# Patient Record
Sex: Male | Born: 1990 | Race: White | Hispanic: No | Marital: Single | Smoking: Former smoker
Health system: Southern US, Community
[De-identification: ages and names within clinical notes are randomized; demographics above are authoritative.]

## PROBLEM LIST (undated history)

## (undated) ENCOUNTER — Ambulatory Visit (HOSPITAL_COMMUNITY): Admission: EM | Payer: Medicaid Other | Source: Home / Self Care

## (undated) DIAGNOSIS — S6291XA Unspecified fracture of right wrist and hand, initial encounter for closed fracture: Secondary | ICD-10-CM

## (undated) DIAGNOSIS — F319 Bipolar disorder, unspecified: Secondary | ICD-10-CM

## (undated) DIAGNOSIS — F209 Schizophrenia, unspecified: Secondary | ICD-10-CM

## (undated) DIAGNOSIS — F329 Major depressive disorder, single episode, unspecified: Secondary | ICD-10-CM

## (undated) DIAGNOSIS — F1990 Other psychoactive substance use, unspecified, uncomplicated: Secondary | ICD-10-CM

## (undated) DIAGNOSIS — F32A Depression, unspecified: Secondary | ICD-10-CM

## (undated) DIAGNOSIS — S6292XA Unspecified fracture of left wrist and hand, initial encounter for closed fracture: Secondary | ICD-10-CM

## (undated) HISTORY — PX: CYST EXCISION: SHX5701

---
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
## 2012-06-24 ENCOUNTER — Emergency Department (HOSPITAL_COMMUNITY): Payer: Self-pay

## 2012-06-24 ENCOUNTER — Encounter (HOSPITAL_COMMUNITY): Payer: Self-pay | Admitting: *Deleted

## 2012-06-24 ENCOUNTER — Emergency Department (HOSPITAL_COMMUNITY)
Admission: EM | Admit: 2012-06-24 | Discharge: 2012-06-24 | Disposition: A | Discharge status: Home / Self Care | Payer: Self-pay | Attending: Emergency Medicine | Admitting: Emergency Medicine

## 2012-06-24 DIAGNOSIS — W503XXA Accidental bite by another person, initial encounter: Secondary | ICD-10-CM

## 2012-06-24 DIAGNOSIS — S0010XA Contusion of unspecified eyelid and periocular area, initial encounter: Secondary | ICD-10-CM | POA: Insufficient documentation

## 2012-06-24 DIAGNOSIS — S61209A Unspecified open wound of unspecified finger without damage to nail, initial encounter: Secondary | ICD-10-CM | POA: Insufficient documentation

## 2012-06-24 DIAGNOSIS — H1130 Conjunctival hemorrhage, unspecified eye: Secondary | ICD-10-CM | POA: Insufficient documentation

## 2012-06-24 DIAGNOSIS — Z8781 Personal history of (healed) traumatic fracture: Secondary | ICD-10-CM | POA: Insufficient documentation

## 2012-06-24 DIAGNOSIS — H9319 Tinnitus, unspecified ear: Secondary | ICD-10-CM | POA: Insufficient documentation

## 2012-06-24 DIAGNOSIS — S0510XA Contusion of eyeball and orbital tissues, unspecified eye, initial encounter: Secondary | ICD-10-CM | POA: Insufficient documentation

## 2012-06-24 DIAGNOSIS — R42 Dizziness and giddiness: Secondary | ICD-10-CM | POA: Insufficient documentation

## 2012-06-24 DIAGNOSIS — H5711 Ocular pain, right eye: Secondary | ICD-10-CM

## 2012-06-24 DIAGNOSIS — IMO0002 Reserved for concepts with insufficient information to code with codable children: Secondary | ICD-10-CM | POA: Insufficient documentation

## 2012-06-24 HISTORY — DX: Unspecified fracture of right wrist and hand, initial encounter for closed fracture: S62.91XA

## 2012-06-24 HISTORY — DX: Unspecified fracture of left wrist and hand, initial encounter for closed fracture: S62.92XA

## 2012-06-24 MED ORDER — OXYCODONE-ACETAMINOPHEN 5-325 MG PO TABS
1.0000 | ORAL_TABLET | Freq: Once | ORAL | Status: AC
  Administered 2012-06-24: 1 via ORAL
  Filled 2012-06-24: qty 1

## 2012-06-24 MED ORDER — AMOXICILLIN-POT CLAVULANATE 875-125 MG PO TABS: 1.0000 | ORAL_TABLET | Freq: Two times a day (BID) | ORAL | Status: DC

## 2012-06-24 MED ORDER — FLUORESCEIN SODIUM 1 MG OP STRP
1.0000 | ORAL_STRIP | Freq: Once | OPHTHALMIC | Status: AC
  Administered 2012-06-24: 11:00:00 via OPHTHALMIC
  Filled 2012-06-24: qty 1

## 2012-06-24 MED ORDER — TETRACAINE HCL 0.5 % OP SOLN
1.0000 [drp] | Freq: Once | OPHTHALMIC | Status: AC
  Administered 2012-06-24: 1 [drp] via OPHTHALMIC
  Filled 2012-06-24: qty 2

## 2012-06-24 MED ORDER — OXYCODONE-ACETAMINOPHEN 5-325 MG PO TABS
1.0000 | ORAL_TABLET | ORAL | Status: DC | PRN
Start: 2012-06-24 — End: 2013-02-06
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
## 2012-06-26 NOTE — ED Provider Notes (Signed)
Medical screening examination/treatment/procedure(s) were performed by non-physician practitioner and as supervising physician I was immediately available for consultation/collaboration.   Gavin Pound. Yue Glasheen, MD 06/26/12 1242

## 2012-07-25 ENCOUNTER — Emergency Department (HOSPITAL_COMMUNITY)
Admission: EM | Admit: 2012-07-25 | Discharge: 2012-07-25 | Disposition: A | Discharge status: Home / Self Care | Payer: Self-pay

## 2012-07-25 NOTE — ED Notes (Signed)
Pt not answering for triage  

## 2012-11-17 ENCOUNTER — Encounter (HOSPITAL_COMMUNITY): Payer: Self-pay | Admitting: Emergency Medicine

## 2012-11-17 ENCOUNTER — Emergency Department (HOSPITAL_COMMUNITY)
Admission: EM | Admit: 2012-11-17 | Discharge: 2012-11-17 | Disposition: A | Discharge status: Home / Self Care | Payer: Self-pay | Attending: Emergency Medicine | Admitting: Emergency Medicine

## 2012-11-17 DIAGNOSIS — L237 Allergic contact dermatitis due to plants, except food: Secondary | ICD-10-CM

## 2012-11-17 DIAGNOSIS — L255 Unspecified contact dermatitis due to plants, except food: Secondary | ICD-10-CM | POA: Insufficient documentation

## 2012-11-17 MED ORDER — PREDNISONE 20 MG PO TABS: ORAL_TABLET | ORAL | Status: DC

## 2012-11-17 MED ORDER — PREDNISONE 20 MG PO TABS
60.0000 mg | ORAL_TABLET | Freq: Once | ORAL | Status: AC
  Administered 2012-11-17: 60 mg via ORAL
  Filled 2012-11-17: qty 3
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
## 2012-11-17 NOTE — ED Provider Notes (Signed)
CSN: [id number]     Arrival date & time 11/17/12  0622 History   First MD Initiated Contact with Patient 11/17/12 [phone]     Chief Complaint  Patient presents with  . Rash  . Pruritis   (Consider location/radiation/quality/duration/timing/severity/associated sxs/prior Treatment) HPI  Miguel [name] is a 22 y.o. male complaining of stinging rash to bilateral lower extreimties x2 weeks after Pt walked through briars. Denies fever, chills, N/V   Past Medical History  Diagnosis Date  . Hand fracture, right 2008    no surgery required  . Hand fracture, left 2010   Past Surgical History  Procedure Laterality Date  . Left hand fracture Left 2010    Dr Magnus Ivan  . Cyst excision     No family history on file. History  Substance Use Topics  . Smoking status: Never Smoker   . Smokeless tobacco: Never Used  . Alcohol Use: No    Review of Systems 10 systems reviewed and found to be negative, except as noted in the HPI   Allergies  Review of patient's allergies indicates no known allergies.  Home Medications   Current Outpatient Rx  Name  Route  Sig  Dispense  Refill  . oxyCODONE-acetaminophen (PERCOCET/ROXICET) 5-325 MG per tablet   Oral   Take 1 tablet by mouth every 4 (four) hours as needed for pain.   15 tablet   0    BP 138/90  Pulse 78  Temp(Src) 97.6 F (36.4 C) (Oral)  Resp 18  SpO2 100% Physical Exam  Nursing note and vitals reviewed. Constitutional: He is oriented to person, place, and time. He appears well-developed and well-nourished. No distress.  HENT:  Head: Normocephalic.  Mouth/Throat: Oropharynx is clear and moist.  Eyes: Conjunctivae and EOM are normal. Pupils are equal, round, and reactive to light.  Cardiovascular: Normal rate, regular rhythm and intact distal pulses.   Pulmonary/Chest: Effort normal and breath sounds normal. No stridor. No respiratory distress. He has no wheezes. He has no rales. He exhibits no tenderness.  Abdominal: Soft.  Bowel sounds are normal. He exhibits no distension and no mass. There is no tenderness. There is no rebound and no guarding.  Musculoskeletal: Normal range of motion.  Neurological: He is alert and oriented to person, place, and time.  Skin:  Erythematous, mildly excoriated papules to bilateral lower extremities, left flank, bilateral upper extremities. No warmth, induration or purulent drainage.   Spares palms, soles and mucous membranes  Psychiatric: He has a normal mood and affect.    ED Course  Procedures (including critical care time) Labs Review Labs Reviewed - No data to display Imaging Review No results found.  EKG Interpretation   None       MDM   1. Contact dermatitis due to poison ivy    Filed Vitals:   11/17/12 0628  BP: 138/90  Pulse: 78  Temp: 97.6 F (36.4 C)  TempSrc: Oral  Resp: 18  SpO2: 100%     Miguel [name] is a 22 y.o. male with rash c/w poison ivy. No signs of secondary infection. Rash with no red flags or systemic complaints  Medications  predniSONE (DELTASONE) tablet 60 mg (not administered)    Pt is hemodynamically stable, appropriate for, and amenable to discharge at this time. Pt verbalized understanding and agrees with care plan. All questions answered. Outpatient follow-up and specific return precautions discussed.    New Prescriptions   PREDNISONE (DELTASONE) 20 MG TABLET    3 tabs  po daily x 3 days, then 2 tabs x 3 days, then 1.5 tabs x 3 days, then 1 tab x 3 days, then 0.5 tabs x 3 days    Note: Portions of this report may have been transcribed using voice recognition software. Every effort was made to ensure accuracy; however, inadvertent computerized transcription errors may be present      Wynetta Emery, PA-C 11/17/12 1610

## 2012-11-19 NOTE — ED Provider Notes (Signed)
Medical screening examination/treatment/procedure(s) were performed by non-physician practitioner and as supervising physician I was immediately available for consultation/collaboration.   Makaleigh Reinard, MD 11/19/12 0755 

## 2012-12-27 ENCOUNTER — Encounter (HOSPITAL_COMMUNITY): Payer: Self-pay | Admitting: Emergency Medicine

## 2012-12-27 ENCOUNTER — Emergency Department (HOSPITAL_COMMUNITY)
Admission: EM | Admit: 2012-12-27 | Discharge: 2012-12-27 | Disposition: A | Discharge status: Home / Self Care | Payer: Self-pay | Attending: Emergency Medicine | Admitting: Emergency Medicine

## 2012-12-27 DIAGNOSIS — B86 Scabies: Secondary | ICD-10-CM | POA: Insufficient documentation

## 2012-12-27 DIAGNOSIS — Z8781 Personal history of (healed) traumatic fracture: Secondary | ICD-10-CM | POA: Insufficient documentation

## 2012-12-27 MED ORDER — PERMETHRIN 5 % EX CREA: TOPICAL_CREAM | CUTANEOUS | Status: DC

## 2012-12-27 NOTE — ED Provider Notes (Signed)
CSN: [id number]     Arrival date & time 12/27/12  0116 History   First MD Initiated Contact with Patient 12/27/12 0200     Chief Complaint  Patient presents with  . Poison Oak   HPI  History provided by the patient. The patient is a 22 year old male presents with concerns for possible poison oak rash. 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 He states that he has had recurring rash and itching primarily to his hands and arms for the past several weeks. He states that he was using calamine lotion with concerns for possible exposure to poison oak and this seemed to help some however he is not having any relief recently. He also reports that his significant other is also having symptoms of similar rash on the body. Significant other does not have any direct exposure to poison oak. He denies any associated fever, chills or sweats. No aggravating or alleviating factors. No other associated symptoms.    Past Medical History  Diagnosis Date  . Hand fracture, right 2008    no surgery required  . Hand fracture, left 2010   Past Surgical History  Procedure Laterality Date  . Left hand fracture Left 2010    Dr Magnus Ivan  . Cyst excision     History reviewed. No pertinent family history. History  Substance Use Topics  . Smoking status: Never Smoker   . Smokeless tobacco: Never Used  . Alcohol Use: No    Review of Systems  Constitutional: Negative for fever, chills and diaphoresis.  Respiratory: Negative for shortness of breath and wheezing.   Skin: Positive for rash.  All other systems reviewed and are negative.    Allergies  Review of patient's allergies indicates no known allergies.  Home Medications   Current Outpatient Rx  Name  Route  Sig  Dispense  Refill  . oxyCODONE-acetaminophen (PERCOCET/ROXICET) 5-325 MG per tablet   Oral   Take 1 tablet by mouth every 4 (four) hours as needed for pain.   15 tablet   0    BP 125/89  Pulse 78   Temp(Src) 97.9 F (36.6 C) (Oral)  Resp 18  SpO2 99% Physical Exam  Nursing note and vitals reviewed. Constitutional: He is oriented to person, place, and time. He appears well-developed and well-nourished. No distress.  HENT:  Head: Normocephalic.  Cardiovascular: Normal rate and regular rhythm.   Pulmonary/Chest: Effort normal and breath sounds normal. No respiratory distress. He has no wheezes. He has no rales.  Abdominal: Soft.  Musculoskeletal: Normal range of motion.  Neurological: He is alert and oriented to person, place, and time.  Skin:  Papular rash to the web spacing of bilateral dorsal hand and fingers. They're similar small papular lesions around the wrist area. No larger areas of erythema or vesicles.  Psychiatric: He has a normal mood and affect. His behavior is normal.    ED Course  Procedures    Patient seen and evaluated. Patient and significant other both with similar rash persistent for the past one month. Rash is not appear consistent with contact dermatitis from poison oak. I doubt that patient is having repeat exposure to the same areas. Rash and symptoms are more concerning for possible scabies. Discussed diagnosis and treatment with patient who agrees with plan.   MDM   1. Scabies        Angus Seller, PA-C 12/27/12 [phone]

## 2012-12-27 NOTE — ED Notes (Signed)
Pt c/o rash, itching, and burning over skin, states he was exposed to poison oak. Pt states was taking steroid.

## 2012-12-27 NOTE — ED Provider Notes (Signed)
Medical screening examination/treatment/procedure(s) were performed by non-physician practitioner and as supervising physician I was immediately available for consultation/collaboration.  EKG Interpretation   None         Junius Argyle, MD 12/27/12 [phone]

## 2013-02-02 ENCOUNTER — Encounter (HOSPITAL_COMMUNITY): Payer: Self-pay | Admitting: Emergency Medicine

## 2013-02-02 ENCOUNTER — Emergency Department (HOSPITAL_COMMUNITY)
Admission: EM | Admit: 2013-02-02 | Discharge: 2013-02-02 | Disposition: A | Discharge status: Home / Self Care | Payer: Self-pay | Attending: Emergency Medicine | Admitting: Emergency Medicine

## 2013-02-02 ENCOUNTER — Emergency Department (HOSPITAL_COMMUNITY): Payer: Self-pay

## 2013-02-02 DIAGNOSIS — M545 Low back pain, unspecified: Secondary | ICD-10-CM

## 2013-02-02 DIAGNOSIS — Y92410 Unspecified street and highway as the place of occurrence of the external cause: Secondary | ICD-10-CM | POA: Insufficient documentation

## 2013-02-02 DIAGNOSIS — Z8781 Personal history of (healed) traumatic fracture: Secondary | ICD-10-CM | POA: Insufficient documentation

## 2013-02-02 DIAGNOSIS — Y9389 Activity, other specified: Secondary | ICD-10-CM | POA: Insufficient documentation

## 2013-02-02 DIAGNOSIS — IMO0002 Reserved for concepts with insufficient information to code with codable children: Secondary | ICD-10-CM | POA: Insufficient documentation

## 2013-02-02 MED ORDER — ACETAMINOPHEN 325 MG PO TABS
650.0000 mg | ORAL_TABLET | Freq: Once | ORAL | Status: AC
  Administered 2013-02-02: 650 mg via ORAL
  Filled 2013-02-02: qty 2

## 2013-02-02 MED ORDER — NAPROXEN 500 MG PO TABS: 500.0000 mg | ORAL_TABLET | Freq: Two times a day (BID) | ORAL | Status: DC

## 2013-02-02 MED ORDER — CYCLOBENZAPRINE HCL 5 MG PO TABS: 5.0000 mg | ORAL_TABLET | Freq: Three times a day (TID) | ORAL | Status: DC | PRN
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
## 2013-02-02 NOTE — ED Notes (Signed)
C/o lower back pain. Ambulates without difficulty.
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
## 2013-02-04 ENCOUNTER — Encounter (HOSPITAL_COMMUNITY): Payer: Self-pay | Admitting: Emergency Medicine

## 2013-02-04 ENCOUNTER — Emergency Department (HOSPITAL_COMMUNITY)
Admission: EM | Admit: 2013-02-04 | Discharge: 2013-02-04 | Disposition: A | Discharge status: Home / Self Care | Payer: Self-pay | Attending: Emergency Medicine | Admitting: Emergency Medicine

## 2013-02-04 DIAGNOSIS — M545 Low back pain, unspecified: Secondary | ICD-10-CM | POA: Insufficient documentation

## 2013-02-04 DIAGNOSIS — G8911 Acute pain due to trauma: Secondary | ICD-10-CM | POA: Insufficient documentation

## 2013-02-04 DIAGNOSIS — Z791 Long term (current) use of non-steroidal anti-inflammatories (NSAID): Secondary | ICD-10-CM | POA: Insufficient documentation

## 2013-02-04 DIAGNOSIS — Z8781 Personal history of (healed) traumatic fracture: Secondary | ICD-10-CM | POA: Insufficient documentation

## 2013-02-04 DIAGNOSIS — S39012D Strain of muscle, fascia and tendon of lower back, subsequent encounter: Secondary | ICD-10-CM

## 2013-02-04 LAB — URINALYSIS, ROUTINE W REFLEX MICROSCOPIC
Bilirubin Urine: NEGATIVE
GLUCOSE, UA: NEGATIVE mg/dL
HGB URINE DIPSTICK: NEGATIVE
Ketones, ur: NEGATIVE mg/dL
Nitrite: NEGATIVE
PH: 6 (ref 5.0–8.0)
PROTEIN: NEGATIVE mg/dL
Specific Gravity, Urine: 1.02 (ref 1.005–1.030)
Urobilinogen, UA: 0.2 mg/dL (ref 0.0–1.0)

## 2013-02-04 LAB — URINE MICROSCOPIC-ADD ON

## 2013-02-04 MED ORDER — DIAZEPAM 2 MG PO TABS
2.0000 mg | ORAL_TABLET | Freq: Once | ORAL | Status: AC
  Administered 2013-02-04: 2 mg via ORAL

## 2013-02-04 MED ORDER — OXYCODONE-ACETAMINOPHEN 5-325 MG PO TABS
ORAL_TABLET | ORAL | Status: AC
  Filled 2013-02-04: qty 2

## 2013-02-04 MED ORDER — OXYCODONE-ACETAMINOPHEN 5-325 MG PO TABS
2.0000 | ORAL_TABLET | Freq: Once | ORAL | Status: AC
  Administered 2013-02-04: 2 via ORAL

## 2013-02-04 MED ORDER — OXYCODONE-ACETAMINOPHEN 5-325 MG PO TABS: 2.0000 | ORAL_TABLET | ORAL | Status: DC | PRN

## 2013-02-04 MED ORDER — HYDROCODONE-ACETAMINOPHEN 5-325 MG PO TABS: 2.0000 | ORAL_TABLET | Freq: Once | ORAL | Status: DC

## 2013-02-04 MED ORDER — HYDROCODONE-ACETAMINOPHEN 5-325 MG PO TABS
ORAL_TABLET | ORAL | Status: AC
  Filled 2013-02-04: qty 2

## 2013-02-04 MED ORDER — DIAZEPAM 2 MG PO TABS
ORAL_TABLET | ORAL | Status: AC
  Filled 2013-02-04: qty 1

## 2013-02-04 MED ORDER — IBUPROFEN 800 MG PO TABS: 800.0000 mg | ORAL_TABLET | Freq: Three times a day (TID) | ORAL | Status: DC
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
## 2013-02-04 NOTE — ED Notes (Signed)
Pt ambulated around POD A with slow, steady gait. Witnessed by Dr. Manus Gunningancour.
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
## 2013-02-04 NOTE — ED Notes (Signed)
Dr. Rancour at bedside. 

## 2013-02-04 NOTE — ED Notes (Signed)
Pt A&Ox4, ambulatory upon discharge.

## 2013-02-04 NOTE — Discharge Instructions (Signed)
Lumbosacral Strain Take ibuprofen instead of naproxen. Followup with the wellness center. Return to the ED if you develop new or worsening symptoms. Lumbosacral strain is one of the most common causes of back pain. There are many causes of back pain. Most are not serious conditions. CAUSES  Your backbone (spinal column) is made up of 24 main vertebral bodies, the sacrum, and the coccyx. These are held together by muscles and tough, fibrous tissue (ligaments). Nerve roots pass through the openings between the vertebrae. A sudden move or injury to the back may cause injury to, or pressure on, these nerves. This may result in localized back pain or pain movement (radiation) into the buttocks, down the leg, and into the foot. Sharp, shooting pain from the buttock down the back of the leg (sciatica) is frequently associated with a ruptured (herniated) disk. Pain may be caused by muscle spasm alone. Your caregiver can often find the cause of your pain by the details of your symptoms and an exam. In some cases, you may need tests (such as X-rays). Your caregiver will work with you to decide if any tests are needed based on your specific exam. HOME CARE INSTRUCTIONS   Avoid an underactive lifestyle. Active exercise, as directed by your caregiver, is your greatest weapon against back pain.  Avoid hard physical activities (tennis, racquetball, waterskiing) if you are not in proper physical condition for it. This may aggravate or create problems.  If you have a back problem, avoid sports requiring sudden body movements. Swimming and walking are generally safer activities.  Maintain good posture.  Avoid becoming overweight (obese).  Use bed rest for only the most extreme, sudden (acute) episode. Your caregiver will help you determine how much bed rest is necessary.  For acute conditions, you may put ice on the injured area.  Put ice in a plastic bag.  Place a towel between your skin and the bag.  Leave  the ice on for 15-20 minutes at a time, every 2 hours, or as needed.  After you are improved and more active, it may help to apply heat for 30 minutes before activities. See your caregiver if you are having pain that lasts longer than expected. Your caregiver can advise appropriate exercises or therapy if needed. With conditioning, most back problems can be avoided. SEEK IMMEDIATE MEDICAL CARE IF:   You have numbness, tingling, weakness, or problems with the use of your arms or legs.  You experience severe back pain not relieved with medicines.  There is a change in bowel or bladder control.  You have increasing pain in any area of the body, including your belly (abdomen).  You notice shortness of breath, dizziness, or feel faint.  You feel sick to your stomach (nauseous), are throwing up (vomiting), or become sweaty.  You notice discoloration of your toes or legs, or your feet get very cold.  Your back pain is getting worse.  You have a fever. MAKE SURE YOU:   Understand these instructions.  Will watch your condition.  Will get help right away if you are not doing well or get worse. Document Released: 10/25/2004 Document Revised: 04/09/2011 Document Reviewed: 04/16/2008 [facility] Patient Information 2014 [place], [place].

## 2013-02-06 ENCOUNTER — Encounter (HOSPITAL_COMMUNITY): Payer: Self-pay | Admitting: Emergency Medicine

## 2013-02-06 ENCOUNTER — Emergency Department (HOSPITAL_COMMUNITY)
Admission: EM | Admit: 2013-02-06 | Discharge: 2013-02-06 | Disposition: A | Discharge status: Home / Self Care | Payer: Self-pay | Attending: Emergency Medicine | Admitting: Emergency Medicine

## 2013-02-06 DIAGNOSIS — M549 Dorsalgia, unspecified: Secondary | ICD-10-CM

## 2013-02-06 DIAGNOSIS — G8911 Acute pain due to trauma: Secondary | ICD-10-CM | POA: Insufficient documentation

## 2013-02-06 DIAGNOSIS — M545 Low back pain, unspecified: Secondary | ICD-10-CM | POA: Insufficient documentation

## 2013-02-06 DIAGNOSIS — Z8781 Personal history of (healed) traumatic fracture: Secondary | ICD-10-CM | POA: Insufficient documentation

## 2013-02-06 MED ORDER — OXYCODONE-ACETAMINOPHEN 5-325 MG PO TABS
2.0000 | ORAL_TABLET | Freq: Once | ORAL | Status: AC
  Administered 2013-02-06: 2 via ORAL
  Filled 2013-02-06: qty 2

## 2013-02-06 MED ORDER — OXYCODONE-ACETAMINOPHEN 5-325 MG PO TABS: 1.0000 | ORAL_TABLET | Freq: Four times a day (QID) | ORAL | Status: DC | PRN

## 2013-02-06 NOTE — Discharge Instructions (Signed)
Back Pain, Adult Low back pain is very common. About 1 in 5 people have back pain.The cause of low back pain is rarely dangerous. The pain often gets better over time.About half of people with a sudden onset of back pain feel better in just 2 weeks. About 8 in 10 people feel better by 6 weeks.  CAUSES Some common causes of back pain include:  Strain of the muscles or ligaments supporting the spine.  Wear and tear (degeneration) of the spinal discs.  Arthritis.  Direct injury to the back. DIAGNOSIS Most of the time, the direct cause of low back pain is not known.However, back pain can be treated effectively even when the exact cause of the pain is unknown.Answering your caregiver's questions about your overall health and symptoms is one of the most accurate ways to make sure the cause of your pain is not dangerous. If your caregiver needs more information, he or she may order lab work or imaging tests (X-rays or MRIs).However, even if imaging tests show changes in your back, this usually does not require surgery. HOME CARE INSTRUCTIONS For many people, back pain returns.Since low back pain is rarely dangerous, it is often a condition that people can learn to manageon their own.   Remain active. It is stressful on the back to sit or stand in one place. Do not sit, drive, or stand in one place for more than 30 minutes at a time. Take short walks on level surfaces as soon as pain allows.Try to increase the length of time you walk each day.  Do not stay in bed.Resting more than 1 or 2 days can delay your recovery.  Do not avoid exercise or work.Your body is made to move.It is not dangerous to be active, even though your back may hurt.Your back will likely heal faster if you return to being active before your pain is gone.  Pay attention to your body when you bend and lift. Many people have less discomfortwhen lifting if they bend their knees, keep the load close to their bodies,and  avoid twisting. Often, the most comfortable positions are those that put less stress on your recovering back.  Find a comfortable position to sleep. Use a firm mattress and lie on your side with your knees slightly bent. If you lie on your back, put a pillow under your knees.  Only take over-the-counter or prescription medicines as directed by your caregiver. Over-the-counter medicines to reduce pain and inflammation are often the most helpful.Your caregiver may prescribe muscle relaxant drugs.These medicines help dull your pain so you can more quickly return to your normal activities and healthy exercise.  Put ice on the injured area.  Put ice in a plastic bag.  Place a towel between your skin and the bag.  Leave the ice on for 15-20 minutes, 03-04 times a day for the first 2 to 3 days. After that, ice and heat may be alternated to reduce pain and spasms.  Ask your caregiver about trying back exercises and gentle massage. This may be of some benefit.  Avoid feeling anxious or stressed.Stress increases muscle tension and can worsen back pain.It is important to recognize when you are anxious or stressed and learn ways to manage it.Exercise is a great option. SEEK MEDICAL CARE IF:  You have pain that is not relieved with rest or medicine.  You have pain that does not improve in 1 week.  You have new symptoms.  You are generally not feeling well. SEEK   IMMEDIATE MEDICAL CARE IF:   You have pain that radiates from your back into your legs.  You develop new bowel or bladder control problems.  You have unusual weakness or numbness in your arms or legs.  You develop nausea or vomiting.  You develop abdominal pain.  You feel faint. Document Released: 01/15/2005 Document Revised: 07/17/2011 Document Reviewed: 06/05/2010 [employer] Patient Information 2014 [employer].  
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
## 2013-02-06 NOTE — ED Provider Notes (Signed)
Medical screening examination/treatment/procedure(s) were performed by non-physician practitioner and as supervising physician I was immediately available for consultation/collaboration.  Christopher J. Pollina, MD 02/06/13 0758 
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
## 2013-02-07 NOTE — ED Provider Notes (Signed)
Medical screening examination/treatment/procedure(s) were performed by non-physician practitioner and as supervising physician I was immediately available for consultation/collaboration.  EKG Interpretation   None         Candyce ChurnJohn David Tangie Stay, MD 02/07/13 1350

## 2013-05-03 ENCOUNTER — Emergency Department (HOSPITAL_COMMUNITY)
Admission: EM | Admit: 2013-05-03 | Discharge: 2013-05-03 | Disposition: A | Discharge status: Home / Self Care | Payer: Self-pay | Attending: Emergency Medicine | Admitting: Emergency Medicine

## 2013-05-03 ENCOUNTER — Encounter (HOSPITAL_COMMUNITY): Payer: Self-pay | Admitting: Emergency Medicine

## 2013-05-03 ENCOUNTER — Emergency Department (HOSPITAL_COMMUNITY): Payer: Self-pay

## 2013-05-03 DIAGNOSIS — Z8781 Personal history of (healed) traumatic fracture: Secondary | ICD-10-CM | POA: Insufficient documentation

## 2013-05-03 DIAGNOSIS — Y9289 Other specified places as the place of occurrence of the external cause: Secondary | ICD-10-CM | POA: Insufficient documentation

## 2013-05-03 DIAGNOSIS — Y990 Civilian activity done for income or pay: Secondary | ICD-10-CM | POA: Insufficient documentation

## 2013-05-03 DIAGNOSIS — L089 Local infection of the skin and subcutaneous tissue, unspecified: Secondary | ICD-10-CM

## 2013-05-03 DIAGNOSIS — Y9389 Activity, other specified: Secondary | ICD-10-CM | POA: Insufficient documentation

## 2013-05-03 DIAGNOSIS — W230XXA Caught, crushed, jammed, or pinched between moving objects, initial encounter: Secondary | ICD-10-CM | POA: Insufficient documentation

## 2013-05-03 DIAGNOSIS — S61209A Unspecified open wound of unspecified finger without damage to nail, initial encounter: Secondary | ICD-10-CM | POA: Insufficient documentation

## 2013-05-03 MED ORDER — TRAMADOL HCL 50 MG PO TABS: 50.0000 mg | ORAL_TABLET | Freq: Four times a day (QID) | ORAL | Status: DC | PRN

## 2013-05-03 MED ORDER — CEPHALEXIN 500 MG PO CAPS: 500.0000 mg | ORAL_CAPSULE | Freq: Four times a day (QID) | ORAL | Status: DC

## 2013-05-03 NOTE — ED Provider Notes (Signed)
CSN: [id number]     Arrival date & time 05/03/13  1656 History  This chart was scribed for non-physician practitioner Teressa Lower, NP working with Junius Argyle, MD by Donne Anon, ED Scribe. This patient was seen in room WTR6/WTR6 and the patient's care was started at 1707.    First MD Initiated Contact with Patient 05/03/13 1707     Chief Complaint  Patient presents with  . Finger Injury   The history is provided by the patient. No language interpreter was used.   HPI Comments: Miguel [name] is a 23 y.o. male who presents to the Emergency Department complaining of a right hand middle finger injury which occurred 3 days ago at work. He states while he was changing a tire his finger became caught between the tire and the rim of the car and he pulled it away, giving him a laceration to his finger. He denies any other symptoms. He has tried cleaning the wound and OTC pain medication without relief. His is unsure if his tetanus shot is UTD.   Past Medical History  Diagnosis Date  . Hand fracture, right 2008    no surgery required  . Hand fracture, left 2010   Past Surgical History  Procedure Laterality Date  . Left hand fracture Left 2010    Dr Magnus Ivan  . Cyst excision     No family history on file. History  Substance Use Topics  . Smoking status: Never Smoker   . Smokeless tobacco: Never Used  . Alcohol Use: No    Review of Systems  Musculoskeletal: Positive for arthralgias and joint swelling.  All other systems reviewed and are negative.      Allergies  Review of patient's allergies indicates no known allergies.  Home Medications   Current Outpatient Rx  Name  Route  Sig  Dispense  Refill  . ibuprofen (ADVIL,MOTRIN) 200 MG tablet   Oral   Take 400 mg by mouth every 6 (six) hours as needed for moderate pain.         [name] oxyCODONE-acetaminophen (PERCOCET/ROXICET) 5-325 MG per tablet   Oral   Take 2 tablets by mouth every 4 (four) hours as needed  for severe pain.   15 tablet   0   . oxyCODONE-acetaminophen (PERCOCET/ROXICET) 5-325 MG per tablet   Oral   Take 1-2 tablets by mouth every 6 (six) hours as needed for severe pain.   15 tablet   0    BP 141/73  Pulse 93  Temp(Src) 98.2 F (36.8 C) (Oral)  Resp 18  Ht 5\' 7"  (1.702 m)  Wt 220 lb (99.791 kg)  BMI 34.45 kg/m2  SpO2 99%  Physical Exam  Nursing note and vitals reviewed. Constitutional: He appears well-developed and well-nourished. No distress.  HENT:  Head: Normocephalic and atraumatic.  Eyes: Conjunctivae are normal.  Neck: Neck supple. No tracheal deviation present.  Cardiovascular: Normal rate, regular rhythm and normal heart sounds.   Pulmonary/Chest: Effort normal and breath sounds normal. No respiratory distress.  Musculoskeletal: Normal range of motion.  Redness and swelling noted around the right middle finger fingernail bed.   Neurological: He is alert.  Skin: Skin is warm and dry.  Psychiatric: He has a normal mood and affect. His behavior is normal.    ED Course  Procedures (including critical care time) DIAGNOSTIC STUDIES: Oxygen Saturation is 99% on RA, normal by my interpretation.    COORDINATION OF CARE: 5:09 PM Discussed treatment plan which includes finger  xray with pt at bedside and pt agreed to plan.    Labs Review Labs Reviewed - No data to display Imaging Review Dg Finger Middle Left  05/03/2013   CLINICAL DATA:  Left middle finger injury  EXAM: LEFT MIDDLE FINGER 2+V  COMPARISON:  None.  FINDINGS: Three views of left third finger submitted. No acute fracture or subluxation. No radiopaque foreign body.  IMPRESSION: Negative.   Electronically Signed   By: Natasha MeadLiviu  Pop M.D.   On: 05/03/2013 17:28     EKG Interpretation None      MDM   Final diagnoses:  Finger infection   Pt appears to have a small area of infection around the nail without definite paronychia. Pt okay to follow up for worsening symptoms. Will treat with  keflex   I personally performed the services described in this documentation, which was scribed in my presence. The recorded information has been reviewed and is accurate.     Teressa LowerVrinda Thamar Holik, NP 05/03/13 1737

## 2013-05-03 NOTE — Discharge Instructions (Signed)
Fingertip Infection When an infection is around the nail, it is called a paronychia. When it appears over the tip of the finger, it is called a felon. These infections are due to minor injuries or cracks in the skin. If they are not treated properly, they can lead to bone infection and permanent damage to the fingernail. Incision and drainage is necessary if a pus pocket (an abscess) has formed. Antibiotics and pain medicine may also be needed. Keep your hand elevated for the next 2-3 days to reduce swelling and pain. If a pack was placed in the abscess, it should be removed in 1-2 days by your caregiver. Soak the finger in warm water for 20 minutes 4 times daily to help promote drainage. Keep the hands as dry as possible. Wear protective gloves with cotton liners. See your caregiver for follow-up care as recommended.  HOME CARE INSTRUCTIONS   Keep wound clean, dry and dressed as suggested by your caregiver.  Soak in warm salt water for fifteen minutes, four times per day for bacterial infections.  Your caregiver will prescribe an antibiotic if a bacterial infection is suspected. Take antibiotics as directed and finish the prescription, even if the problem appears to be improving before the medicine is gone.  Only take over-the-counter or prescription medicines for pain, discomfort, or fever as directed by your caregiver. SEEK IMMEDIATE MEDICAL CARE IF:  There is redness, swelling, or increasing pain in the wound.  Pus or any other unusual drainage is coming from the wound.  An unexplained oral temperature above 102 F (38.9 C) develops.  You notice a foul smell coming from the wound or dressing. MAKE SURE YOU:   Understand these instructions.  Monitor your condition.  Contact your caregiver if you are getting worse or not improving. Document Released: 02/23/2004 Document Revised: 04/09/2011 Document Reviewed: 02/19/2008 [employer] Patient Information 2014 [employer].  

## 2013-05-03 NOTE — ED Notes (Addendum)
Pt presents with NAD.  Friday at work. ?infection ? Injury of middle finger left hand during application of tire at work. Pt report OTC meds without relief.

## 2013-05-04 NOTE — ED Provider Notes (Signed)
Medical screening examination/treatment/procedure(s) were performed by non-physician practitioner and as supervising physician I was immediately available for consultation/collaboration.   EKG Interpretation None        Wes Lezotte S Baley Shands, MD 05/04/13 1132 

## 2013-05-05 ENCOUNTER — Encounter (HOSPITAL_COMMUNITY): Payer: Self-pay | Admitting: Emergency Medicine

## 2013-05-05 ENCOUNTER — Emergency Department (HOSPITAL_COMMUNITY)
Admission: EM | Admit: 2013-05-05 | Discharge: 2013-05-05 | Disposition: A | Discharge status: Home / Self Care | Payer: Self-pay | Attending: Emergency Medicine | Admitting: Emergency Medicine

## 2013-05-05 DIAGNOSIS — Z87828 Personal history of other (healed) physical injury and trauma: Secondary | ICD-10-CM | POA: Insufficient documentation

## 2013-05-05 DIAGNOSIS — IMO0001 Reserved for inherently not codable concepts without codable children: Secondary | ICD-10-CM

## 2013-05-05 DIAGNOSIS — Z8781 Personal history of (healed) traumatic fracture: Secondary | ICD-10-CM | POA: Insufficient documentation

## 2013-05-05 DIAGNOSIS — L03019 Cellulitis of unspecified finger: Secondary | ICD-10-CM | POA: Insufficient documentation

## 2013-05-05 LAB — CBC
HCT: 43.8 % (ref 39.0–52.0)
Hemoglobin: 15.3 g/dL (ref 13.0–17.0)
MCH: 30.4 pg (ref 26.0–34.0)
MCHC: 34.9 g/dL (ref 30.0–36.0)
MCV: 86.9 fL (ref 78.0–100.0)
PLATELETS: 219 10*3/uL (ref 150–400)
RBC: 5.04 MIL/uL (ref 4.22–5.81)
RDW: 12.1 % (ref 11.5–15.5)
WBC: 9.9 10*3/uL (ref 4.0–10.5)

## 2013-05-05 LAB — BASIC METABOLIC PANEL
BUN: 12 mg/dL (ref 6–23)
CHLORIDE: 105 meq/L (ref 96–112)
CO2: 26 mEq/L (ref 19–32)
Calcium: 9.2 mg/dL (ref 8.4–10.5)
Creatinine, Ser: 0.61 mg/dL (ref 0.50–1.35)
Glucose, Bld: 89 mg/dL (ref 70–99)
POTASSIUM: 4.3 meq/L (ref 3.7–5.3)
SODIUM: 144 meq/L (ref 137–147)

## 2013-05-05 MED ORDER — OXYCODONE-ACETAMINOPHEN 7.5-325 MG PO TABS: 1.0000 | ORAL_TABLET | ORAL | Status: DC | PRN

## 2013-05-05 MED ORDER — OXYCODONE-ACETAMINOPHEN 5-325 MG PO TABS
1.0000 | ORAL_TABLET | Freq: Once | ORAL | Status: AC
Start: 2013-05-05 — End: 2013-05-05
  Administered 2013-05-05: 2 via ORAL
  Filled 2013-05-05: qty 2

## 2013-05-05 MED ORDER — CEPHALEXIN 500 MG PO CAPS: 500.0000 mg | ORAL_CAPSULE | Freq: Four times a day (QID) | ORAL | Status: DC

## 2013-05-05 NOTE — ED Notes (Signed)
Pt reports injury to L middle finger on Friday at work. [facility] and prescribed antibiotic and tramadol. Pt did not get either prescription filled because he states he can not take tramadol because it makes him break out in hives. Pt sts "I didn't get either filled because it would be a waste of money because I can't take the tramadol" pt requesting to have finger lanced.

## 2013-05-05 NOTE — ED Provider Notes (Signed)
I saw and evaluated the patient, reviewed the resident's note and I agree with the findings and plan.   EKG Interpretation None      Patient here with paronychia. Given antibiotics for this but did not fill. Instructed to fill antibiotics. I&D at bedside, no concern for septic arthritis. Stable for discharge.  Dagmar HaitWilliam Kamerin Axford, MD 05/05/13 [phone]

## 2013-05-05 NOTE — ED Provider Notes (Signed)
CSN: [id number]     Arrival date & time 05/05/13  1819 History   First MD Initiated Contact with Patient 05/05/13 2003     Chief Complaint  Patient presents with  . Finger Injury     (Consider location/radiation/quality/duration/timing/severity/associated sxs/prior Treatment) Patient is a 23 y.o. male presenting with abscess. The history is provided by the patient.  Abscess Location:  Finger Finger abscess location:  L long finger Size:  2 cm Abscess quality: fluctuance, painful, redness and warmth   Abscess quality: not draining, no itching and not weeping   Red streaking: yes   Duration:  3 days Progression:  Worsening Pain details:    Quality:  Pressure   Severity:  Mild   Duration:  3 days   Timing:  Constant   Progression:  Worsening Chronicity:  New Context: skin injury   Context: not diabetes and not insect bite/sting   Context comment:  Pt injured finger 4 days ago, and went to [facility], had neg xr, started on antibiotics for cellulitis, but never filled. Now worsening, with abscess and red streaks up finger Relieved by:  Nothing Worsened by:  Draining/squeezing Ineffective treatments:  None tried Associated symptoms: no fatigue, no fever, no nausea and no vomiting   Risk factors: no prior abscess     Past Medical History  Diagnosis Date  . Hand fracture, right 2008    no surgery required  . Hand fracture, left 2010   Past Surgical History  Procedure Laterality Date  . Left hand fracture Left 2010    Dr Magnus IvanBlackman  . Cyst excision     No family history on file. History  Substance Use Topics  . Smoking status: Never Smoker   . Smokeless tobacco: Never Used  . Alcohol Use: No    Review of Systems  Constitutional: Negative for fever, activity change, appetite change and fatigue.  HENT: Negative for congestion and rhinorrhea.   Eyes: Negative for discharge and itching.  Respiratory: Negative for cough, shortness of breath and wheezing.    Cardiovascular: Negative for chest pain.  Gastrointestinal: Negative for nausea, vomiting, abdominal pain, diarrhea and constipation.  Genitourinary: Negative for hematuria, decreased urine volume and difficulty urinating.  Skin: Positive for wound. Negative for rash.  Neurological: Negative for syncope, weakness and numbness.  All other systems reviewed and are negative.      Allergies  Tramadol  Home Medications   Current Outpatient Rx  Name  Route  Sig  Dispense  Refill  . cephALEXin (KEFLEX) 500 MG capsule   Oral   Take 1 capsule (500 mg total) by mouth 4 (four) times daily.   28 capsule   0   . cephALEXin (KEFLEX) 500 MG capsule   Oral   Take 1 capsule (500 mg total) by mouth 4 (four) times daily.   28 capsule   0   . oxyCODONE-acetaminophen (PERCOCET) 7.5-325 MG per tablet   Oral   Take 1 tablet by mouth every 4 (four) hours as needed for pain.   15 tablet   0    BP 131/69  Pulse 82  Temp(Src) 98 F (36.7 C) (Oral)  Resp 20  Wt 327 lb 6 oz (148.496 kg)  SpO2 96% Physical Exam  Vitals reviewed. Constitutional: He is oriented to person, place, and time. He appears well-developed and well-nourished. No distress.  Well appearing, NAD  HENT:  Head: Normocephalic and atraumatic.  Mouth/Throat: Oropharynx is clear and moist. No oropharyngeal exudate.  Eyes: Conjunctivae and  EOM are normal. Pupils are equal, round, and reactive to light. Right eye exhibits no discharge. Left eye exhibits no discharge. No scleral icterus.  Neck: Normal range of motion. Neck supple.  Cardiovascular: Normal rate, regular rhythm, normal heart sounds and intact distal pulses.  Exam reveals no gallop and no friction rub.   No murmur heard. Pulmonary/Chest: Effort normal and breath sounds normal. No respiratory distress. He has no wheezes. He has no rales.  Abdominal: Soft. He exhibits no distension and no mass. There is no tenderness.  Musculoskeletal: Normal range of motion.  3rd  L finger, has 2 cm paronychia, with mild redness, ttp. Pathcy mild red streaking on dorsum of finger up to distal hand. Only fluctuance is at paronychia, no ttp of hand. Can ragne finger without difficulty both actively and passively  Neurological: He is alert and oriented to person, place, and time. No cranial nerve deficit. He exhibits normal muscle tone. Coordination normal.  Skin: Skin is warm. No rash noted. He is not diaphoretic.    ED Course  INCISION AND DRAINAGE Date/Time: 05/05/2013 10:39 PM Performed by: Pilar Jarvis Authorized by: Dagmar Hait Consent: Verbal consent obtained. Risks and benefits: risks, benefits and alternatives were discussed Consent given by: patient Patient understanding: patient states understanding of the procedure being performed Patient consent: the patient's understanding of the procedure matches consent given Site marked: the operative site was marked Required items: required blood products, implants, devices, and special equipment available Patient identity confirmed: verbally with patient, arm band and hospital-assigned identification number Type: abscess Body area: upper extremity Location details: left long finger Anesthesia: digital block Local anesthetic: lidocaine 1% without epinephrine Anesthetic total: 4 ml Patient sedated: no Scalpel size: 11 Incision type: single straight Complexity: simple Drainage: purulent Drainage amount: moderate Wound treatment: wound left open Packing material: none Patient tolerance: Patient tolerated the procedure well with no immediate complications.   (including critical care time) Labs Review Labs Reviewed  CBC  BASIC METABOLIC PANEL   Imaging Review No results found.   EKG Interpretation None      MDM   MDM: 23 y.o. WM w/ no PMHx w/ cc: of abscess on L 3rd finger. Injured finger 4 days ago and was seen at [facility] with neg XR. Had ?paronychia at that time, and was given Keflex but has  not taken. No systemic signs. AFVSS, paronychia on exam, with mild red streaking. No volar streaking, no swelling of proximal finger. Can range finger, making flexor tenosynovitis unlikely. I&D'd as above with relief of fluctuance and purulent discharge. Will give Keflex and Percocet. Instructed importance of taking antibiotics. Recommend taking some time off work. Follow up with ED if worsening sxs. Discharged. Care of case d/w my attending.  Final diagnoses:  Paronychia of third finger, left    Discharged  Pilar Jarvis, MD 05/05/13 2249

## 2013-05-05 NOTE — Discharge Instructions (Signed)
Paronychia Paronychia is an inflammatory reaction involving the folds of the skin surrounding the fingernail. This is commonly caused by an infection in the skin around a nail. The most common cause of paronychia is frequent wetting of the hands (as seen with bartenders, food servers, nurses or others who wet their hands). This makes the skin around the fingernail susceptible to infection by bacteria (germs) or fungus. Other predisposing factors are:  Aggressive manicuring.  Nail biting.  Thumb sucking. The most common cause is a staphylococcal (a type of germ) infection, or a fungal (Candida) infection. When caused by a germ, it usually comes on suddenly with redness, swelling, pus and is often painful. It may get under the nail and form an abscess (collection of pus), or form an abscess around the nail. If the nail itself is infected with a fungus, the treatment is usually prolonged and may require oral medicine for up to one year. Your caregiver will determine the length of time treatment is required. The paronychia caused by bacteria (germs) may largely be avoided by not pulling on hangnails or picking at cuticles. When the infection occurs at the tips of the finger it is called felon. When the cause of paronychia is from the herpes simplex virus (HSV) it is called herpetic whitlow. TREATMENT  When an abscess is present treatment is often incision and drainage. This means that the abscess must be cut open so the pus can get out. When this is done, the following home care instructions should be followed. HOME CARE INSTRUCTIONS   It is important to keep the affected fingers very dry. Rubber or plastic gloves over cotton gloves should be used whenever the hand must be placed in water.  Keep wound clean, dry and dressed as suggested by your caregiver between warm soaks or warm compresses.  Soak in warm water for fifteen to twenty minutes three to four times per day for bacterial infections. Fungal  infections are very difficult to treat, so often require treatment for long periods of time.  For bacterial (germ) infections take antibiotics (medicine which kill germs) as directed and finish the prescription, even if the problem appears to be solved before the medicine is gone.  Only take over-the-counter or prescription medicines for pain, discomfort, or fever as directed by your caregiver. SEEK IMMEDIATE MEDICAL CARE IF:  You have redness, swelling, or increasing pain in the wound.  You notice pus coming from the wound.  You have a fever.  You notice a bad smell coming from the wound or dressing. Document Released: 07/11/2000 Document Revised: 04/09/2011 Document Reviewed: 03/12/2008 Exit[employer] Patient Information 2014 [employer].  

## 2013-08-19 ENCOUNTER — Ambulatory Visit: Payer: Self-pay

## 2014-05-04 ENCOUNTER — Encounter (HOSPITAL_COMMUNITY): Payer: Self-pay | Admitting: Emergency Medicine

## 2014-05-04 ENCOUNTER — Emergency Department (HOSPITAL_COMMUNITY): Payer: No Typology Code available for payment source

## 2014-05-04 ENCOUNTER — Emergency Department (HOSPITAL_COMMUNITY)
Admission: EM | Admit: 2014-05-04 | Discharge: 2014-05-04 | Disposition: A | Discharge status: Home / Self Care | Payer: No Typology Code available for payment source | Attending: Emergency Medicine | Admitting: Emergency Medicine

## 2014-05-04 DIAGNOSIS — Y998 Other external cause status: Secondary | ICD-10-CM | POA: Insufficient documentation

## 2014-05-04 DIAGNOSIS — Z8781 Personal history of (healed) traumatic fracture: Secondary | ICD-10-CM | POA: Diagnosis not present

## 2014-05-04 DIAGNOSIS — S51012A Laceration without foreign body of left elbow, initial encounter: Secondary | ICD-10-CM | POA: Insufficient documentation

## 2014-05-04 DIAGNOSIS — Z23 Encounter for immunization: Secondary | ICD-10-CM | POA: Diagnosis not present

## 2014-05-04 DIAGNOSIS — Y9389 Activity, other specified: Secondary | ICD-10-CM | POA: Diagnosis not present

## 2014-05-04 DIAGNOSIS — Z792 Long term (current) use of antibiotics: Secondary | ICD-10-CM | POA: Insufficient documentation

## 2014-05-04 DIAGNOSIS — Y92410 Unspecified street and highway as the place of occurrence of the external cause: Secondary | ICD-10-CM | POA: Insufficient documentation

## 2014-05-04 DIAGNOSIS — S32010A Wedge compression fracture of first lumbar vertebra, initial encounter for closed fracture: Secondary | ICD-10-CM | POA: Diagnosis not present

## 2014-05-04 DIAGNOSIS — S3992XA Unspecified injury of lower back, initial encounter: Secondary | ICD-10-CM | POA: Diagnosis present

## 2014-05-04 MED ORDER — OXYCODONE-ACETAMINOPHEN 5-325 MG PO TABS
1.0000 | ORAL_TABLET | Freq: Once | ORAL | Status: AC
  Administered 2014-05-04: 1 via ORAL
  Filled 2014-05-04: qty 1

## 2014-05-04 MED ORDER — LIDOCAINE HCL 2 % IJ SOLN
10.0000 mL | Freq: Once | INTRAMUSCULAR | Status: AC
  Administered 2014-05-04: 200 mg
  Filled 2014-05-04: qty 20

## 2014-05-04 MED ORDER — IBUPROFEN 400 MG PO TABS
600.0000 mg | ORAL_TABLET | Freq: Once | ORAL | Status: DC
  Filled 2014-05-04 (×2): qty 1

## 2014-05-04 MED ORDER — OXYCODONE-ACETAMINOPHEN 5-325 MG PO TABS: 1.0000 | ORAL_TABLET | ORAL | Status: DC | PRN

## 2014-05-04 MED ORDER — TETANUS-DIPHTH-ACELL PERTUSSIS 5-2.5-18.5 LF-MCG/0.5 IM SUSP
0.5000 mL | Freq: Once | INTRAMUSCULAR | Status: AC
  Administered 2014-05-04: 0.5 mL via INTRAMUSCULAR
  Filled 2014-05-04: qty 0.5

## 2014-05-04 NOTE — ED Notes (Signed)
Back board removed by Dr. Preston FleetingGlick.

## 2014-05-04 NOTE — ED Notes (Signed)
Patient reports he doesn't want to wait for brace and is ready to be discharged.

## 2014-05-04 NOTE — ED Notes (Addendum)
MD at bedside.Dr. Preston FleetingGlick in to suture left elbow.

## 2014-05-04 NOTE — ED Notes (Signed)
[employer] states that out-vender has to place TLSO brace and it will morning until it can be applied.

## 2014-05-04 NOTE — ED Notes (Signed)
Patient still off the unit for testing. Informed family pain meds available on his return.

## 2014-05-04 NOTE — ED Notes (Signed)
Dr. Preston FleetingGlick is at the bedside for stitches of left elbow.

## 2014-05-04 NOTE — ED Notes (Signed)
Patient refused motrin for pain. "motrin is all i fuckin get, aint nothin fuckin wrong with me". Patient then begins to remove his c-collar after explaining the Dr. Is to allow it first.

## 2014-05-04 NOTE — ED Notes (Signed)
Plan to transfer patient to POC C while awaiting TLSO.
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
## 2014-05-04 NOTE — ED Notes (Signed)
Discussed pain medication for the patient. Currently off the unit for radiology. MD acknowledges, awaiting orders.

## 2014-05-04 NOTE — ED Notes (Signed)
Mother and uncle in to see the patient, explained to mother prior to her arrival to room that the patient is upset. Collaborate in efforts to calm him.
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
## 2014-05-04 NOTE — ED Notes (Signed)
Reminded the patient that suturing is still needed.

## 2014-05-04 NOTE — ED Notes (Signed)
Called Dr. Preston FleetingGlick to report patient's request for leaving without brace, and more pain medication. MD acknowledges, allows for another percocet.

## 2014-05-07 ENCOUNTER — Encounter (HOSPITAL_COMMUNITY): Payer: Self-pay | Admitting: *Deleted

## 2014-05-07 ENCOUNTER — Emergency Department (HOSPITAL_COMMUNITY)
Admission: EM | Admit: 2014-05-07 | Discharge: 2014-05-07 | Disposition: A | Discharge status: Home / Self Care | Payer: No Typology Code available for payment source | Attending: Emergency Medicine | Admitting: Emergency Medicine

## 2014-05-07 DIAGNOSIS — S32000D Wedge compression fracture of unspecified lumbar vertebra, subsequent encounter for fracture with routine healing: Secondary | ICD-10-CM | POA: Insufficient documentation

## 2014-05-07 DIAGNOSIS — Z792 Long term (current) use of antibiotics: Secondary | ICD-10-CM | POA: Insufficient documentation

## 2014-05-07 DIAGNOSIS — Z79899 Other long term (current) drug therapy: Secondary | ICD-10-CM | POA: Insufficient documentation

## 2014-05-07 DIAGNOSIS — S3992XD Unspecified injury of lower back, subsequent encounter: Secondary | ICD-10-CM | POA: Diagnosis present

## 2014-05-07 DIAGNOSIS — Z8781 Personal history of (healed) traumatic fracture: Secondary | ICD-10-CM | POA: Insufficient documentation

## 2014-05-07 MED ORDER — IBUPROFEN 400 MG PO TABS
800.0000 mg | ORAL_TABLET | Freq: Once | ORAL | Status: AC
  Administered 2014-05-07: 800 mg via ORAL
  Filled 2014-05-07: qty 2

## 2014-05-07 MED ORDER — OXYCODONE-ACETAMINOPHEN 5-325 MG PO TABS: 2.0000 | ORAL_TABLET | Freq: Four times a day (QID) | ORAL | Status: DC | PRN
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
## 2014-05-09 ENCOUNTER — Emergency Department (HOSPITAL_COMMUNITY)
Admission: EM | Admit: 2014-05-09 | Discharge: 2014-05-09 | Disposition: A | Discharge status: Home / Self Care | Payer: No Typology Code available for payment source | Attending: Emergency Medicine | Admitting: Emergency Medicine

## 2014-05-09 ENCOUNTER — Encounter (HOSPITAL_COMMUNITY): Payer: Self-pay | Admitting: *Deleted

## 2014-05-09 DIAGNOSIS — Z760 Encounter for issue of repeat prescription: Secondary | ICD-10-CM | POA: Insufficient documentation

## 2014-05-09 DIAGNOSIS — Z8781 Personal history of (healed) traumatic fracture: Secondary | ICD-10-CM | POA: Insufficient documentation

## 2014-05-09 DIAGNOSIS — E669 Obesity, unspecified: Secondary | ICD-10-CM | POA: Diagnosis not present

## 2014-05-09 DIAGNOSIS — Z87828 Personal history of other (healed) physical injury and trauma: Secondary | ICD-10-CM | POA: Insufficient documentation

## 2014-05-09 DIAGNOSIS — M545 Low back pain: Secondary | ICD-10-CM | POA: Diagnosis present

## 2014-05-09 MED ORDER — CYCLOBENZAPRINE HCL 10 MG PO TABS
10.0000 mg | ORAL_TABLET | Freq: Once | ORAL | Status: AC
Start: 2014-05-09 — End: 2014-05-09
  Administered 2014-05-09: 10 mg via ORAL
  Filled 2014-05-09: qty 1

## 2014-05-09 MED ORDER — CYCLOBENZAPRINE HCL 10 MG PO TABS: 10.0000 mg | ORAL_TABLET | Freq: Two times a day (BID) | ORAL | Status: DC | PRN

## 2014-05-09 MED ORDER — OXYCODONE-ACETAMINOPHEN 5-325 MG PO TABS: 1.0000 | ORAL_TABLET | Freq: Four times a day (QID) | ORAL | Status: DC | PRN

## 2014-05-09 MED ORDER — DICLOFENAC SODIUM 75 MG PO TBEC: 75.0000 mg | DELAYED_RELEASE_TABLET | Freq: Two times a day (BID) | ORAL | Status: DC

## 2014-05-09 MED ORDER — OXYCODONE-ACETAMINOPHEN 5-325 MG PO TABS
1.0000 | ORAL_TABLET | Freq: Once | ORAL | Status: AC
  Administered 2014-05-09: 1 via ORAL
  Filled 2014-05-09: qty 1

## 2014-05-09 MED ORDER — KETOROLAC TROMETHAMINE 60 MG/2ML IM SOLN
60.0000 mg | Freq: Once | INTRAMUSCULAR | Status: AC
  Administered 2014-05-09: 60 mg via INTRAMUSCULAR
  Filled 2014-05-09: qty 2
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
## 2014-05-09 NOTE — Discharge Instructions (Signed)
Call the neurosurgeon's office in the morning for follow up.   Medication Refill, Emergency Department We have refilled your medication today as a courtesy to you. It is best for your medical care, however, to take care of getting refills done through your primary caregiver's office. They have your records and can do a better job of follow-up than we can in the emergency department. On maintenance medications, we often only prescribe enough medications to get you by until you are able to see your regular caregiver. This is a more expensive way to refill medications. In the future, please plan for refills so that you will not have to use the emergency department for this. Thank you for your help. Your help allows us to better take care of the daily emergencies that enter our department. Document Released: 05/04/2003 Document Revised: 04/09/2011 Document Reviewed: 04/24/2013 [facility] Patient Information 2015 [place], [place]. This information is not intended to replace advice given to you by your health care provider. Make sure you discuss any questions you have with your health care provider.

## 2014-05-12 ENCOUNTER — Encounter (HOSPITAL_COMMUNITY): Payer: Self-pay | Admitting: Family Medicine

## 2014-05-12 ENCOUNTER — Emergency Department (HOSPITAL_COMMUNITY)
Admission: EM | Admit: 2014-05-12 | Discharge: 2014-05-12 | Disposition: A | Discharge status: Home / Self Care | Payer: No Typology Code available for payment source | Attending: Emergency Medicine | Admitting: Emergency Medicine

## 2014-05-12 DIAGNOSIS — Z792 Long term (current) use of antibiotics: Secondary | ICD-10-CM | POA: Diagnosis not present

## 2014-05-12 DIAGNOSIS — M545 Low back pain: Secondary | ICD-10-CM | POA: Diagnosis present

## 2014-05-12 DIAGNOSIS — M549 Dorsalgia, unspecified: Secondary | ICD-10-CM

## 2014-05-12 DIAGNOSIS — Z87828 Personal history of other (healed) physical injury and trauma: Secondary | ICD-10-CM | POA: Diagnosis not present

## 2014-05-12 DIAGNOSIS — Z791 Long term (current) use of non-steroidal anti-inflammatories (NSAID): Secondary | ICD-10-CM | POA: Diagnosis not present

## 2014-05-12 DIAGNOSIS — M2550 Pain in unspecified joint: Secondary | ICD-10-CM | POA: Diagnosis not present
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
## 2014-05-12 NOTE — Discharge Instructions (Signed)
Back Pain, Adult °Low back pain is very common. About 1 in 5 people have back pain. The cause of low back pain is rarely dangerous. The pain often gets better over time. About half of people with a sudden onset of back pain feel better in just 2 weeks. About 8 in 10 people feel better by 6 weeks.  °CAUSES °Some common causes of back pain include: °· Strain of the muscles or ligaments supporting the spine. °· Wear and tear (degeneration) of the spinal discs. °· Arthritis. °· Direct injury to the back. °DIAGNOSIS °Most of the time, the direct cause of low back pain is not known. However, back pain can be treated effectively even when the exact cause of the pain is unknown. Answering your caregiver's questions about your overall health and symptoms is one of the most accurate ways to make sure the cause of your pain is not dangerous. If your caregiver needs more information, he or she may order lab work or imaging tests (X-rays or MRIs). However, even if imaging tests show changes in your back, this usually does not require surgery. °HOME CARE INSTRUCTIONS °For many people, back pain returns. Since low back pain is rarely dangerous, it is often a condition that people can learn to manage on their own.  °· Remain active. It is stressful on the back to sit or stand in one place. Do not sit, drive, or stand in one place for more than 30 minutes at a time. Take short walks on level surfaces as soon as pain allows. Try to increase the length of time you walk each day. °· Do not stay in bed. Resting more than 1 or 2 days can delay your recovery. °· Do not avoid exercise or work. Your body is made to move. It is not dangerous to be active, even though your back may hurt. Your back will likely heal faster if you return to being active before your pain is gone. °· Pay attention to your body when you  bend and lift. Many people have less discomfort when lifting if they bend their knees, keep the load close to their bodies, and  avoid twisting. Often, the most comfortable positions are those that put less stress on your recovering back. °· Find a comfortable position to sleep. Use a firm mattress and lie on your side with your knees slightly bent. If you lie on your back, put a pillow under your knees. °· Only take over-the-counter or prescription medicines as directed by your caregiver. Over-the-counter medicines to reduce pain and inflammation are often the most helpful. Your caregiver may prescribe muscle relaxant drugs. These medicines help dull your pain so you can more quickly return to your normal activities and healthy exercise. °· Put ice on the injured area. °¨ Put ice in a plastic bag. °¨ Place a towel between your skin and the bag. °¨ Leave the ice on for 15-20 minutes, 03-04 times a day for the first 2 to 3 days. After that, ice and heat may be alternated to reduce pain and spasms. °· Ask your caregiver about trying back exercises and gentle massage. This may be of some benefit. °· Avoid feeling anxious or stressed. Stress increases muscle tension and can worsen back pain. It is important to recognize when you are anxious or stressed and learn ways to manage it. Exercise is a great option. °SEEK MEDICAL CARE IF: °· You have pain that is not relieved with rest or medicine. °· You have pain that does not improve in 1 week. °· You have new symptoms. °· You are generally not feeling well. °SEEK   IMMEDIATE MEDICAL CARE IF:  °· You have pain that radiates from your back into your legs. °· You develop new bowel or bladder control problems. °· You have unusual weakness or numbness in your arms or legs. °· You develop nausea or vomiting. °· You develop abdominal pain. °· You feel faint. °Document Released: 01/15/2005 Document Revised: 07/17/2011 Document Reviewed: 05/19/2013 °ExitCare® Patient Information ©2015 Exit[employer], LLC. This information is not intended to replace advice given to you by your health care provider. Make sure you  discuss any questions you have with your health care provider. ° °Back Exercises °Back exercises help treat and prevent back injuries. The goal of back exercises is to increase the strength of your abdominal and back muscles and the flexibility of your back. These exercises should be started when you no longer have back pain. Back exercises include: °· Pelvic Tilt. Lie on your back with your knees bent. Tilt your pelvis until the lower part of your back is against the floor. Hold this position 5 to 10 sec and repeat 5 to 10 times. °· Knee to Chest. Pull first 1 knee up against your chest and hold for 20 to 30 seconds, repeat this with the other knee, and then both knees. This may be done with the other leg straight or bent, whichever feels better. °· Sit-Ups or Curl-Ups. Bend your knees 90 degrees. Start with tilting your pelvis, and do a partial, slow sit-up, lifting your trunk only 30 to 45 degrees off the floor. Take at least 2 to 3 seconds for each sit-up. Do not do sit-ups with your knees out straight. If partial sit-ups are difficult, simply do the above but with only tightening your abdominal muscles and holding it as directed. °· Hip-Lift. Lie on your back with your knees flexed 90 degrees. Push down with your feet and shoulders as you raise your hips a couple inches off the floor; hold for 10 seconds, repeat 5 to 10 times. °· Back arches. Lie on your stomach, propping yourself up on bent elbows. Slowly press on your hands, causing an arch in your low back. Repeat 3 to 5 times. Any initial stiffness and discomfort should lessen with repetition over time. °· Shoulder-Lifts. Lie face down with arms beside your body. Keep hips and torso pressed to floor as you slowly lift your head and shoulders off the floor. °Do not overdo your exercises, especially in the beginning. Exercises may cause you some mild back discomfort which lasts for a few minutes; however, if the pain is more severe, or lasts for more than 15  minutes, do not continue exercises until you see your caregiver. Improvement with exercise therapy for back problems is slow.  °See your caregivers for assistance with developing a proper back exercise program. °Document Released: 02/23/2004 Document Revised: 04/09/2011 Document Reviewed: 11/16/2010 °ExitCare® Patient Information ©2015 Exit[employer], LLC. This information is not intended to replace advice given to you by your health care provider. Make sure you discuss any questions you have with your health care provider. ° °
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
## 2014-06-10 ENCOUNTER — Encounter (HOSPITAL_COMMUNITY): Payer: Self-pay

## 2014-06-10 ENCOUNTER — Emergency Department (HOSPITAL_COMMUNITY)
Admission: EM | Admit: 2014-06-10 | Discharge: 2014-06-11 | Disposition: A | Discharge status: Other Acute Inpatient Hospital | Payer: No Typology Code available for payment source | Attending: Emergency Medicine | Admitting: Emergency Medicine

## 2014-06-10 DIAGNOSIS — Y9389 Activity, other specified: Secondary | ICD-10-CM | POA: Insufficient documentation

## 2014-06-10 DIAGNOSIS — Y9289 Other specified places as the place of occurrence of the external cause: Secondary | ICD-10-CM | POA: Insufficient documentation

## 2014-06-10 DIAGNOSIS — T428X2A Poisoning by antiparkinsonism drugs and other central muscle-tone depressants, intentional self-harm, initial encounter: Secondary | ICD-10-CM | POA: Insufficient documentation

## 2014-06-10 DIAGNOSIS — Z8781 Personal history of (healed) traumatic fracture: Secondary | ICD-10-CM | POA: Diagnosis not present

## 2014-06-10 DIAGNOSIS — T50902A Poisoning by unspecified drugs, medicaments and biological substances, intentional self-harm, initial encounter: Secondary | ICD-10-CM

## 2014-06-10 DIAGNOSIS — Y998 Other external cause status: Secondary | ICD-10-CM | POA: Insufficient documentation

## 2014-06-10 LAB — COMPREHENSIVE METABOLIC PANEL
ALK PHOS: 84 U/L (ref 38–126)
ALT: 67 U/L — AB (ref 17–63)
AST: 41 U/L (ref 15–41)
Albumin: 4.1 g/dL (ref 3.5–5.0)
Anion gap: 8 (ref 5–15)
BUN: 11 mg/dL (ref 6–20)
CHLORIDE: 104 mmol/L (ref 101–111)
CO2: 27 mmol/L (ref 22–32)
CREATININE: 0.76 mg/dL (ref 0.61–1.24)
Calcium: 8.4 mg/dL — ABNORMAL LOW (ref 8.9–10.3)
GFR calc Af Amer: >60 mL/min (ref >60)
Glucose, Bld: 92 mg/dL (ref 65–99)
Potassium: 4.2 mmol/L (ref 3.5–5.1)
SODIUM: 139 mmol/L (ref 135–145)
Total Bilirubin: 0.4 mg/dL (ref 0.3–1.2)
Total Protein: 7.1 g/dL (ref 6.5–8.1)

## 2014-06-10 LAB — CBC WITH DIFFERENTIAL/PLATELET
Basophils Absolute: 0 10*3/uL (ref 0.0–0.1)
Basophils Relative: 0 % (ref 0–1)
EOS ABS: 0.2 10*3/uL (ref 0.0–0.7)
EOS PCT: 3 % (ref 0–5)
HCT: 43.4 % (ref 39.0–52.0)
HEMOGLOBIN: 15.1 g/dL (ref 13.0–17.0)
Lymphocytes Relative: 35 % (ref 12–46)
Lymphs Abs: 2.7 10*3/uL (ref 0.7–4.0)
MCH: 30.3 pg (ref 26.0–34.0)
MCHC: 34.8 g/dL (ref 30.0–36.0)
MCV: 87 fL (ref 78.0–100.0)
MONOS PCT: 6 % (ref 3–12)
Monocytes Absolute: 0.4 10*3/uL (ref 0.1–1.0)
NEUTROS PCT: 56 % (ref 43–77)
Neutro Abs: 4.2 10*3/uL (ref 1.7–7.7)
Platelets: 215 10*3/uL (ref 150–400)
RBC: 4.99 MIL/uL (ref 4.22–5.81)
RDW: 12.2 % (ref 11.5–15.5)
WBC: 7.6 10*3/uL (ref 4.0–10.5)

## 2014-06-10 LAB — ETHANOL: Alcohol, Ethyl (B): 42 mg/dL — ABNORMAL HIGH (ref <5)

## 2014-06-10 LAB — ACETAMINOPHEN LEVEL: Acetaminophen (Tylenol), Serum: <10 ug/mL — ABNORMAL LOW (ref 10–30)

## 2014-06-10 LAB — SALICYLATE LEVEL: Salicylate Lvl: <4 mg/dL (ref 2.8–30.0)

## 2014-06-10 NOTE — ED Notes (Signed)
[facility]

## 2014-06-10 NOTE — ED Notes (Signed)
Lelon MastSamantha (mother) [phone] Alinda Moneyony (brother) [phone] (call if needs a ride)

## 2014-06-10 NOTE — ED Notes (Signed)
Pt reports that he took approx (30) 500 mg Methocarbamol in an admitted suicide attempt.  Pt states he took the pills because of "everything in life"  But will not specify a specific event that caused him to want to harm himself today.  Pt states he took the pills between 2 and 4 pm today.

## 2014-06-10 NOTE — ED Notes (Signed)
[facility].  Dr. Jama Flavorsobos has accepted.
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
## 2014-06-10 NOTE — ED Provider Notes (Signed)
CSN: [id number]     Arrival date & time 06/10/14  1949 History  This chart was scribed for Geoffery Lyonsouglas Fouad Taul, MD by [place] Peace, ED Scribe. The patient was seen in [facility]03/[facility]03. The patient's care was started at 8:26 PM.    Chief Complaint  Patient presents with  . Drug Overdose      Patient is a 24 y.o. male presenting with Overdose. The history is provided by the patient. No language interpreter was used.  Drug Overdose This is a new problem. The current episode started 1 to 2 hours ago. The problem has not changed since onset. HPI Comments: Terel Ray Harlon FlorWhitaker is a 24 y.o. male who presents to the Emergency Department complaining of drug overdose of Metacarbamol in attempt to commit suicide today. He reports that he is not sure how much medication he took but just states he took "a lot" along with drinking alcohol "in hopes that it would help harm himself". Pt explains that everything in his life is going wrong. He does not specify what event caused him to reach this point but adds that "his baby died in the past and he is suffering from post-traumatic stress". Hie denies any similar attempts to harm himself in the past.   Past Medical History  Diagnosis Date  . Hand fracture, right 2008    no surgery required  . Hand fracture, left 2010   Past Surgical History  Procedure Laterality Date  . Left hand fracture Left 2010    Dr Magnus IvanBlackman  . Cyst excision     No family history on file. History  Substance Use Topics  . Smoking status: Never Smoker   . Smokeless tobacco: Never Used  . Alcohol Use: No    Review of Systems  Psychiatric/Behavioral: Positive for suicidal ideas and self-injury.  All other systems reviewed and are negative.     Allergies  Tramadol and Vicodin  Home Medications   Prior to Admission medications   Medication Sig Start Date End Date Taking? Authorizing Provider  oxyCODONE (OXY IR/ROXICODONE) 5 MG immediate release tablet Take 5-10 mg by mouth every 4  (four) hours as needed for moderate pain or severe pain.  06/07/14  Yes Historical Provider, MD  cephALEXin (KEFLEX) 500 MG capsule Take 1 capsule (500 mg total) by mouth 4 (four) times daily. Patient not taking: Reported on 05/04/2014 05/03/13   Teressa LowerVrinda Pickering, NP  cephALEXin (KEFLEX) 500 MG capsule Take 1 capsule (500 mg total) by mouth 4 (four) times daily. Patient not taking: Reported on 05/04/2014 05/05/13   Pilar Jarvisoug Brtalik, MD  cyclobenzaprine (FLEXERIL) 10 MG tablet Take 1 tablet (10 mg total) by mouth 2 (two) times daily as needed for muscle spasms. Patient not taking: Reported on 06/10/2014 05/09/14   Janne NapoleonHope M Neese, NP  diclofenac (VOLTAREN) 75 MG EC tablet Take 1 tablet (75 mg total) by mouth 2 (two) times daily. Patient not taking: Reported on 06/10/2014 05/09/14   Janne NapoleonHope M Neese, NP  oxyCODONE-acetaminophen (ROXICET) 5-325 MG per tablet Take 1 tablet by mouth every 6 (six) hours as needed for severe pain. Patient not taking: Reported on 06/10/2014 05/09/14   Hope Orlene OchM Neese, NP   BP 133/118 mmHg  Pulse 88  Temp(Src) 98 F (36.7 C) (Oral)  Resp 17  Ht 5\' 7"  (1.702 m)  Wt 310 lb (140.615 kg)  BMI 48.54 kg/m2  SpO2 98% Physical Exam  Constitutional: He is oriented to person, place, and time. He appears well-developed and well-nourished. No distress.  HENT:  Head: Normocephalic and atraumatic.  Eyes: Conjunctivae and EOM are normal.  Neck: Neck supple. No tracheal deviation present.  Cardiovascular: Normal rate.   Pulmonary/Chest: Effort normal. No respiratory distress.  Musculoskeletal: Normal range of motion.  Neurological: He is alert and oriented to person, place, and time.  Skin: Skin is warm and dry.  Psychiatric: He has a normal mood and affect. His speech is normal. He is slowed. Cognition and memory are normal. He expresses impulsivity. He expresses suicidal ideation.  Nursing note and vitals reviewed.   ED Course  Procedures (including critical care time) Labs Review Labs Reviewed -  No data to display  Imaging Review No results found.   EKG Interpretation None     Medications - No data to display  8:32 PM- Treatment plan was discussed with patient who verbalizes understanding and agrees.   MDM   Final diagnoses:  None   Patient by EMS after an overdose of muscle relaxers.  Appears medically stable and clear for TTS evaluation.  I personally performed the services described in this documentation, which was scribed in my presence. The recorded information has been reviewed and is accurate.    Geoffery Lyonsouglas Surya Schroeter, MD 06/13/14 [phone]

## 2014-06-10 NOTE — ED Notes (Signed)
Per Poison Control, monitor patient for 4-6 hours post-ingestion, do tylenol level now and ekg.  If labs and ekg are normal, pt will be able to proceed for psychiatric eval.

## 2014-06-10 NOTE — ED Notes (Signed)
2230-patient given food tray per request.

## 2014-06-11 ENCOUNTER — Inpatient Hospital Stay (HOSPITAL_COMMUNITY)
Admission: AD | Admit: 2014-06-11 | Discharge: 2014-06-17 | DRG: 885 | Disposition: A | Discharge status: Home / Self Care | Payer: No Typology Code available for payment source | Source: Intra-hospital | Attending: Psychiatry | Admitting: Psychiatry

## 2014-06-11 ENCOUNTER — Encounter (HOSPITAL_COMMUNITY): Payer: Self-pay

## 2014-06-11 DIAGNOSIS — F1120 Opioid dependence, uncomplicated: Secondary | ICD-10-CM | POA: Diagnosis present

## 2014-06-11 DIAGNOSIS — F410 Panic disorder [episodic paroxysmal anxiety] without agoraphobia: Secondary | ICD-10-CM | POA: Diagnosis present

## 2014-06-11 DIAGNOSIS — T428X2A Poisoning by antiparkinsonism drugs and other central muscle-tone depressants, intentional self-harm, initial encounter: Secondary | ICD-10-CM | POA: Diagnosis not present

## 2014-06-11 DIAGNOSIS — F329 Major depressive disorder, single episode, unspecified: Secondary | ICD-10-CM

## 2014-06-11 DIAGNOSIS — G4700 Insomnia, unspecified: Secondary | ICD-10-CM | POA: Diagnosis present

## 2014-06-11 DIAGNOSIS — F515 Nightmare disorder: Secondary | ICD-10-CM | POA: Insufficient documentation

## 2014-06-11 DIAGNOSIS — F332 Major depressive disorder, recurrent severe without psychotic features: Principal | ICD-10-CM | POA: Diagnosis present

## 2014-06-11 DIAGNOSIS — F4312 Post-traumatic stress disorder, chronic: Secondary | ICD-10-CM | POA: Diagnosis present

## 2014-06-11 DIAGNOSIS — M549 Dorsalgia, unspecified: Secondary | ICD-10-CM | POA: Diagnosis present

## 2014-06-11 DIAGNOSIS — R45851 Suicidal ideations: Secondary | ICD-10-CM | POA: Diagnosis present

## 2014-06-11 DIAGNOSIS — F4310 Post-traumatic stress disorder, unspecified: Secondary | ICD-10-CM

## 2014-06-11 HISTORY — DX: Major depressive disorder, single episode, unspecified: F32.9

## 2014-06-11 HISTORY — DX: Depression, unspecified: F32.A

## 2014-06-11 LAB — URINALYSIS, ROUTINE W REFLEX MICROSCOPIC
Bilirubin Urine: NEGATIVE
Glucose, UA: NEGATIVE mg/dL
HGB URINE DIPSTICK: NEGATIVE
Ketones, ur: NEGATIVE mg/dL
Leukocytes, UA: NEGATIVE
Nitrite: NEGATIVE
PROTEIN: NEGATIVE mg/dL
Specific Gravity, Urine: 1.025 (ref 1.005–1.030)
Urobilinogen, UA: 0.2 mg/dL (ref 0.0–1.0)
pH: 5.5 (ref 5.0–8.0)

## 2014-06-11 LAB — RAPID URINE DRUG SCREEN, HOSP PERFORMED
AMPHETAMINES: NOT DETECTED
Barbiturates: NOT DETECTED
Benzodiazepines: POSITIVE — AB
COCAINE: NOT DETECTED
Opiates: POSITIVE — AB
Tetrahydrocannabinol: NOT DETECTED

## 2014-06-11 LAB — TSH: TSH: 1.218 u[IU]/mL (ref 0.350–4.500)

## 2014-06-11 MED ORDER — MAGNESIUM HYDROXIDE 400 MG/5ML PO SUSP: 30.0000 mL | Freq: Every day | ORAL | Status: DC | PRN

## 2014-06-11 MED ORDER — HYDROXYZINE HCL 25 MG PO TABS
25.0000 mg | ORAL_TABLET | Freq: Three times a day (TID) | ORAL | Status: DC | PRN
  Administered 2014-06-11 – 2014-06-14 (×7): 25 mg via ORAL
  Filled 2014-06-11 (×7): qty 1

## 2014-06-11 MED ORDER — OXYCODONE HCL 5 MG PO TABS
7.5000 mg | ORAL_TABLET | ORAL | Status: DC | PRN
  Administered 2014-06-11 – 2014-06-14 (×12): 7.5 mg via ORAL
  Filled 2014-06-11 (×12): qty 2

## 2014-06-11 MED ORDER — FLUOXETINE HCL 10 MG PO CAPS
10.0000 mg | ORAL_CAPSULE | Freq: Every day | ORAL | Status: DC
  Administered 2014-06-11 – 2014-06-12 (×2): 10 mg via ORAL
  Filled 2014-06-11 (×5): qty 1

## 2014-06-11 MED ORDER — ALUM & MAG HYDROXIDE-SIMETH 200-200-20 MG/5ML PO SUSP: 30.0000 mL | ORAL | Status: DC | PRN

## 2014-06-11 NOTE — Progress Notes (Signed)
Patient ID: Miguel [name], male   DOB: 07/10/1990, 24 y.o.   MRN: [id number]   Pt currently presents with a anxious affect and guarded behavior. Per self inventory, pt rates depression, hopelessness and anxiety at a 0. Pt's daily goal is to "talking to others" and they intend to do so by "talk to the people around me." Pt reports good sleep, good concentration and a good appetite. Pt complains about back pain stating "I just had surgery on my back.. They were giving me OXY, can I get that here?"  Pt provided with medications per providers orders. Pt's labs and vitals were monitored throughout the day. Pt supported emotionally and encouraged to express concerns and questions. Pt educated on medications. Pt offered alternative pain management techniques.  Pt's safety ensured with 15 minute and environmental checks. Pt currently denies SI/HI and A/V hallucinations. Pt verbally agrees to seek staff if SI/HI or A/VH occurs and to consult with staff before acting on these thoughts. Pt outside doing stretching and talking with other pts. Pt's affect and verbal reports inconsistent.

## 2014-06-11 NOTE — [facility] Suicide Risk Assessment (Signed)
[facility] INPATIENT:  Family/Significant Other Suicide Prevention Education  Suicide Prevention Education:  Patient Refusal for Family/Significant Other Suicide Prevention Education: The patient Miguel [name] has refused to provide written consent for family/significant other to be provided Family/Significant Other Suicide Prevention Education during admission and/or prior to discharge.  Physician notified.  Wynn BankerHodnett, Brileigh Sevcik Hairston 06/11/2014, 1:24 PM
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
## 2014-06-11 NOTE — Progress Notes (Addendum)
D: Patient complaining of pain in back and also not being able to sleep. Patient denies SI/HI/AVH He states that when he goes to sleep that he still sees his daughter's face. Patient stated that his day was a 8/10. He attended group therapy tonight. He gave coping strategies that he would use when discharged. He stated that he needed to be around for his 5710 month old son. Patient stated that for his goal, he would like to cope with stress better.  A: Safety checks performed q 15 min. Patient encouraged and supported with coping strategies.   R: Patient behavior appropriate to situation.

## 2014-06-11 NOTE — [facility] Group Notes (Signed)
[facility]   06/11/2014 10:09 AM    Participation Quality:  Appropraite  Mood/Affect:  Appropriate  Depression Rating:  0  Anxiety Rating:  0  Thoughts of Suicide:  No  Will you contract for safety?   NA  Current AVH:  No  Plan for Discharge/Comments:  Patient attended discharge planning group and actively participated in group. He advised of not having an outpatient provider.  Referral to be made for services.  Suicide prevention education reviewed and SPE document provided.   Transportation Means: Patient has transportation.   Supports:  Patient has a support system.   Pranit Owensby, Joesph JulyQuylle Hairston

## 2014-06-11 NOTE — Plan of Care (Signed)
Problem: Alteration in mood Goal: STG-Patient is able to discuss feelings and issues (Patient is able to discuss feelings and issues leading to depression)  Outcome: Not Progressing Pt minimizes. Pt affect and verbal statements are inconsistent.

## 2014-06-11 NOTE — Progress Notes (Signed)
Recreation Therapy Notes  Date: 06/11/14 Time: 9:30am Location: 300 Hall Group Room  Group Topic: Stress Management  Goal Area(s) Addresses:  Patient will actively participate in stress management techniques presented during session.   Intervention: Stress management techniques  Activity: Guided Imagery. LRT provided instruction and demonstration for Guided Imagery.   Education: Stress Management, Discharge Planning.   Clinical Observations/Feedback: Patient did not attend group.   Jeb Schloemer, LRT/CTRS         Shereda Graw A 06/11/2014 3:45 PM 

## 2014-06-11 NOTE — Tx Team (Signed)
Initial Interdisciplinary Treatment Plan   PATIENT STRESSORS: Financial difficulties Health problems Occupational concerns   PATIENT STRENGTHS: Average or above average intelligence Capable of independent living Communication skills Work skills   PROBLEM LIST: Problem List/Patient Goals Date to be addressed Date deferred Reason deferred Estimated date of resolution  Pt. Would like to get help for "I guess my anxiety and depression.  I would like to get back on klonipin and wellbutrin".      Depression with SI      GAD, PTSD, Anger                                           DISCHARGE CRITERIA:  Improved stabilization in mood, thinking, and/or behavior Need for constant or close observation no longer present Verbal commitment to aftercare and medication compliance  PRELIMINARY DISCHARGE PLAN: Participate in family therapy Return to previous living arrangement  PATIENT/FAMIILY INVOLVEMENT: This treatment plan has been presented to and reviewed with the patient, Dejan Marylin CrosbyRay Spink, and/or family member, .  The patient and family have been given the opportunity to ask questions and make suggestions.  Cooper RenderSadler, Hadiya Spoerl Jean Horne 06/11/2014, 3:59 AM
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
## 2014-06-11 NOTE — [facility] Group Notes (Signed)
[facility] LCSW Group Therapy  Feelings Around Relapse 1:15 -2:30        06/11/2014   Type of Therapy:  Group Therapy  Participation Level:  Appropriate  Participation Quality:  Appropriate  Affect:  Appropriate  Cognitive:  Attentive Appropriate  Insight:  Developing/Improving  Engagement in Therapy: Developing/Improving  Modes of Intervention:  Discussion Exploration Problem-Solving Supportive  Summary of Progress/Problems:  The topic for today was feelings around relapse.    Patient processed feelings toward relapse and was able to relate to peers. Patient reports he would be robbing from people and stealing if he were to relapse.  He advised being a father has changed his life. Patient identified coping skills that can be used to prevent a relapse.   Wynn BankerHodnett, Caroleena Paolini Hairston 06/11/2014
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
## 2014-06-12 ENCOUNTER — Encounter (HOSPITAL_COMMUNITY): Payer: Self-pay | Admitting: Registered Nurse

## 2014-06-12 MED ORDER — FLUOXETINE HCL 20 MG PO CAPS
20.0000 mg | ORAL_CAPSULE | Freq: Every day | ORAL | Status: DC
  Administered 2014-06-13 – 2014-06-17 (×5): 20 mg via ORAL
  Filled 2014-06-12 (×4): qty 1
  Filled 2014-06-12: qty 3
  Filled 2014-06-12 (×3): qty 1

## 2014-06-12 MED ORDER — ACETAMINOPHEN 500 MG PO TABS: 1000.0000 mg | ORAL_TABLET | Freq: Four times a day (QID) | ORAL | Status: DC | PRN

## 2014-06-12 MED ORDER — ACETAMINOPHEN 325 MG PO TABS
650.0000 mg | ORAL_TABLET | Freq: Four times a day (QID) | ORAL | Status: DC | PRN
  Administered 2014-06-12 – 2014-06-14 (×5): 650 mg via ORAL
  Filled 2014-06-12 (×5): qty 2

## 2014-06-12 NOTE — [facility] Group Notes (Signed)
[facility] Group Notes:  (Clinical Social Work)  06/12/2014     1:15-2:15PM  Summary of Progress/Problems:   The main focus of today's process group was to learn how to use a decisional balance exercise to move forward in the Stages of Change, which were described and discussed.  Motivational Interviewing and a worksheet were utilized to help patients explore in depth the perceived benefits and costs of unhealthy coping techniques, as well as the  benefits and costs of replacing that with a healthy coping skills.   The patient expressed that he tried to kill himself due to losing his car, losing his job, feeling hopeless, not being on medications.  We talked at length about the loss of his baby daughter and processed that grief.  [facility], felt like the therapist was new and was not helping him.  CSW informed him about [facility] support group and [facility] support groups specific to loss of child.  We also discussed what [facility], his mental health medication manager, can do in terms of looking at medications that he can afford.  Type of Therapy:  Group Therapy - Process   Participation Level:  Active  Participation Quality:  Attentive, Sharing and Supportive  Affect:  Blunted, Depressed and Tearful  Cognitive:  Appropriate and Oriented  Insight:  Developing/Improving  Engagement in Therapy:  Developing/Improving  Modes of Intervention:  Education, Motivational Interviewing  Ambrose MantleMareida Grossman-Orr, LCSW 06/12/2014, 4:33 PM

## 2014-06-12 NOTE — Progress Notes (Signed)
[facility] Group Notes:  (Nursing/MHT/Case Management/Adjunct)  Date:  06/12/2014  Time:  9:24 PM  Type of Therapy:  Psychoeducational Skills  Participation Level:  Minimal  Participation Quality:  Resistant  Affect:  Flat  Cognitive:  Lacking  Insight:  Limited  Engagement in Group:  Resistant  Modes of Intervention:  Education  Summary of Progress/Problems: The patient verbalized in group that he was not happy since he feels that the medication has not been effective. As a theme for the day, his coping skill is to attend a "rehab" program.   Westly PamGOODMAN, Vaishnavi Dalby S 06/12/2014, 9:24 PM

## 2014-06-12 NOTE — Progress Notes (Signed)
D: Patient continues to complain of back pain.  Requested tylenol after he had just received his oxy IR earlier this morning.  Informed patient he did not have tylenol ordered.  Patient remains depressed with flat, blunted affect.  He rates his depression and hopelessness as an 8; anxiety as a 10.  He reports passive SI and contracts for safety on the unit.  His goal today is to "find something to help me with anxiety."  He also feels he needs more time here.   A: Continue to monitor medication management and MD orders.  Safety checks completed every 15 minutes per protocol.  Meet 1;1 with patient to discuss concerns and offer encouragement. R: Patient's behavior is appropriate to situation.

## 2014-06-12 NOTE — Progress Notes (Signed)
[facility]  06/12/2014 10:57 AM Miguel Marylin CrosbyRay [name]  MRN:  [id number]   Subjective:  "I tried to overdose; just feeling real depressed. I lost my job recently; everything just being overwhelmed and stuff."   Patient states that he is tolerating medication "I haven't felt nothing".  States that he didn't sleep well last night "but I guess that medicine just hit me or something cause I'm sleepy now."   Patient states that he did attend one group session yesterday.  Patient continues to endorse depression rate 10/10, anxiety 8/10.  Continues to have suicidal thoughts "earlier this morning."   Over dosed on muscle relaxer. Patient  states that he was taking Prozac 2 yrs ago after the passing of his "baby" and I was taking 20 or 30 mg.  Principal Problem: Severe recurrent major depression without psychotic features Diagnosis:   Patient Active Problem List   Diagnosis Date Noted  . PTSD (post-traumatic stress disorder) [F43.10] 06/11/2014  . Severe recurrent major depression without psychotic features [F33.2] 06/11/2014   Total Time spent with patient: 45 minutes   Past Medical History:  Past Medical History  Diagnosis Date  . Hand fracture, right 2008    no surgery required  . Hand fracture, left 2010  . Depression     Past Surgical History  Procedure Laterality Date  . Left hand fracture Left 2010    Dr Magnus IvanBlackman  . Cyst excision     Family History: History reviewed. No pertinent family history. Social History:  History  Alcohol Use No    Comment: He drank this time in effort to hurt self/ usually non drinker     History  Drug Use No    History   Social History  . Marital Status: Single    Spouse Name: N/A  . Number of Children: N/A  . Years of Education: N/A   Social History Main Topics  . Smoking status: Never Smoker   . Smokeless tobacco: Never Used  . Alcohol Use: No     Comment: He drank this time in effort to hurt self/ usually non drinker  . Drug  Use: No  . Sexual Activity: Yes    Birth Control/ Protection: None   Other Topics Concern  . None   Social History Narrative   Additional History:    Sleep: Poor  Appetite:  Fair   Assessment:   Musculoskeletal: Strength & Muscle Tone: within normal limits Gait & Station: normal Patient leans: N/A   Psychiatric Specialty Exam: Physical Exam  Psychiatric: His speech is normal. He is actively hallucinating. Cognition and memory are normal. He exhibits a depressed mood. He expresses suicidal ideation. He expresses suicidal plans.    Review of Systems  Cardiovascular:       History HTN  Psychiatric/Behavioral: Positive for depression, suicidal ideas and hallucinations. The patient is nervous/anxious and has insomnia.   All other systems reviewed and are negative.   Blood pressure 129/88, pulse 82, temperature 97.7 F (36.5 C), temperature source Oral, resp. rate 16, height 5' 6.5" (1.689 m), weight 141.069 kg (311 lb).Body mass index is 49.45 kg/(m^2).  General Appearance: Casual  Eye Contact::  Fair  Speech:  Clear and Coherent and Normal Rate  Volume:  Normal  Mood:  Depressed  Affect:  Depressed and Flat  Thought Process:  Linear  Orientation:  Full (Time, Place, and Person)  Thought Content:  Hallucinations: Auditory hearing a baby cry  Suicidal Thoughts:  Yes.  with intent/plan  Homicidal Thoughts:  State that he was having thoughts but not at this present time  Memory:  Immediate;   Good Recent;   Good Remote;   Good  Judgement:  Poor  Insight:  Lacking  Psychomotor Activity:  Normal  Concentration:  Fair  Recall:  Good  Fund of Knowledge:Fair  Language: Good  Akathisia:  No  Handed:  Right  AIMS (if indicated):     Assets:  Communication Skills Housing  ADL's:  Intact  Cognition: WNL  Sleep:        Current Medications: Current Facility-Administered Medications  Medication Dose Route Frequency Provider Last Rate Last Dose  . alum & mag  hydroxide-simeth (MAALOX/MYLANTA) 200-200-20 MG/5ML suspension 30 mL  30 mL Oral Q4H PRN Worthy FlankIjeoma E Nwaeze, NP      . FLUoxetine (PROZAC) capsule 10 mg  10 mg Oral Daily Rachael FeeIrving A Lugo, MD   10 mg at 06/12/14 0739  . hydrOXYzine (ATARAX/VISTARIL) tablet 25 mg  25 mg Oral TID PRN Worthy FlankIjeoma E Nwaeze, NP   25 mg at 06/11/14 2111  . magnesium hydroxide (MILK OF MAGNESIA) suspension 30 mL  30 mL Oral Daily PRN Worthy FlankIjeoma E Nwaeze, NP      . oxyCODONE (Oxy IR/ROXICODONE) immediate release tablet 7.5 mg  7.5 mg Oral Q4H PRN Rachael FeeIrving A Lugo, MD   7.5 mg at 06/12/14 [id number]    Lab Results:  Results for orders placed or performed during the hospital encounter of 06/11/14 (from the past 48 hour(s))  TSH     Status: None   Collection Time: 06/11/14  6:15 AM  Result Value Ref Range   TSH 1.218 0.350 - 4.500 uIU/mL    Comment: [facility]    Physical Findings: AIMS: Facial and Oral Movements Muscles of Facial Expression: None, normal Lips and Perioral Area: None, normal Jaw: None, normal Tongue: None, normal,Extremity Movements Upper (arms, wrists, hands, fingers): None, normal Lower (legs, knees, ankles, toes): None, normal, Trunk Movements Neck, shoulders, hips: None, normal, Overall Severity Severity of abnormal movements (highest score from questions above): None, normal Incapacitation due to abnormal movements: None, normal Patient's awareness of abnormal movements (rate only patient's report): No Awareness, Dental Status Current problems with teeth and/or dentures?: No Does patient usually wear dentures?: No  CIWA:    COWS:     Treatment Plan Summary: Daily contact with patient to assess and evaluate symptoms and progress in treatment and Medication management  Supportive approach/coping skills PTSD; help process the trauma/will start Prozac 20 mg daily will use CBT/mindfulness  Depression; will use the Prozac as it can approach both the depression and the PTSD and is  affordable Back pain; will resume the Roxicodone and explore using other approaches to help him with the pain  Will increase Prozac to 20 mg  Medical Decision Making:  Established Problem, Stable/Improving (1), Review of Psycho-Social Stressors (1), Review or order clinical lab tests (1), Review and summation of old records (2), Review of Last Therapy Session (1), Review of Medication Regimen & Side Effects (2) and Review of New Medication or Change in Dosage (2)   Rankin, Shuvon, FNP-BC 06/12/2014, 10:57 AM I agree with findings and treatment plan of this patient

## 2014-06-12 NOTE — Progress Notes (Signed)
D    Pt is depressed and sad   He expresses having anxiety and feeling depressed    Pt is appropriate and interacts well with others  A   Verbal support given   Medications administered and effectiveness monitored    Q 15 min checks R    Pt safe at present

## 2014-06-13 DIAGNOSIS — F515 Nightmare disorder: Secondary | ICD-10-CM | POA: Insufficient documentation

## 2014-06-13 MED ORDER — PRAZOSIN HCL 1 MG PO CAPS
1.0000 mg | ORAL_CAPSULE | Freq: Every day | ORAL | Status: DC
  Administered 2014-06-13: 1 mg via ORAL
  Filled 2014-06-13 (×4): qty 1

## 2014-06-13 NOTE — Progress Notes (Signed)
[facility]  06/13/2014 10:01 AM Miguel [name]  MRN:  [id number]   Subjective:  "I been feeling like I been hearing people calling me; I asked the nurse if anyone has been coming in my room calling me and she said no.  I guess my anxiety has been higher."  Rates anxiety 9/10, depression 8/10.  States in the past he has never heard voice but "I heard my baby cry".  States was hearing baby cry prior to admission "couple of days before I came here."  States that he is not hearing at the present time.  But the voices "hearing someone call my name"  Started last night "I thought it was the guy that was coming in my room "saying hey time to get up; hey it's time to go.  So I asked and they said no one was calling me." Patient states that he woke twice during the night and not a restful sleep; States that he is eating "fine"; Tolerating medications without adverse reactions; and participating in group sessions "I haven' been going to all of them but I have been to a couple of them.: Patient states that he continues to have nightmares about his baby suffocating in her crib. States that baby (girl) passed 2 years ago.  She was 325 months old.   Principal Problem: Severe recurrent major depression without psychotic features Diagnosis:   Patient Active Problem List   Diagnosis Date Noted  . PTSD (post-traumatic stress disorder) [F43.10] 06/11/2014  . Severe recurrent major depression without psychotic features [F33.2] 06/11/2014   Total Time spent with patient: 20 minutes   Past Medical History:  Past Medical History  Diagnosis Date  . Hand fracture, right 2008    no surgery required  . Hand fracture, left 2010  . Depression     Past Surgical History  Procedure Laterality Date  . Left hand fracture Left 2010    Dr Magnus IvanBlackman  . Cyst excision     Family History: History reviewed. No pertinent family history. Social History:  History  Alcohol Use No    Comment: He drank this time  in effort to hurt self/ usually non drinker     History  Drug Use No    History   Social History  . Marital Status: Single    Spouse Name: N/A  . Number of Children: N/A  . Years of Education: N/A   Social History Main Topics  . Smoking status: Never Smoker   . Smokeless tobacco: Never Used  . Alcohol Use: No     Comment: He drank this time in effort to hurt self/ usually non drinker  . Drug Use: No  . Sexual Activity: Yes    Birth Control/ Protection: None   Other Topics Concern  . None   Social History Narrative   Additional History:    Sleep: Poor  Appetite:  Fair   Assessment:   Musculoskeletal: Strength & Muscle Tone: within normal limits Gait & Station: normal Patient leans: N/A   Psychiatric Specialty Exam: Physical Exam  Nursing note and vitals reviewed. Constitutional: He is oriented to person, place, and time.  Neck: Normal range of motion.  Respiratory: Effort normal.  Musculoskeletal: Normal range of motion.  Neurological: He is alert and oriented to person, place, and time.  Psychiatric: His speech is normal. He is actively hallucinating. Cognition and memory are normal. He exhibits a depressed mood. He expresses suicidal ideation.    ROS  Blood  pressure 111/72, pulse 83, temperature 97.5 F (36.4 C), temperature source Oral, resp. rate 18, height 5' 6.5" (1.689 m), weight 141.069 kg (311 lb).Body mass index is 49.45 kg/(m^2).  General Appearance: Casual  Eye Contact::  Fair  Speech:  Clear and Coherent and Normal Rate  Volume:  Normal  Mood:  Depressed  Affect:  Depressed and Flat  Thought Process:  Linear  Orientation:  Full (Time, Place, and Person)  Thought Content:  Hallucinations: Auditory hearing someone call his name  Suicidal Thoughts:  Yes.  with intent/plan  Homicidal Thoughts:  State that he was having thoughts but not at this present time  Memory:  Immediate;   Good Recent;   Good Remote;   Good  Judgement:  Poor   Insight:  Fair  Psychomotor Activity:  Normal  Concentration:  Fair  Recall:  Good  Fund of Knowledge:Fair  Language: Good  Akathisia:  No  Handed:  Right  AIMS (if indicated):     Assets:  Communication Skills Housing  ADL's:  Intact  Cognition: WNL  Sleep:  Number of Hours: 6.75     Current Medications: Current Facility-Administered Medications  Medication Dose Route Frequency Provider Last Rate Last Dose  . acetaminophen (TYLENOL) tablet 650 mg  650 mg Oral Q6H PRN Adonis Brook, NP   650 mg at 06/13/14 0948  . alum & mag hydroxide-simeth (MAALOX/MYLANTA) 200-200-20 MG/5ML suspension 30 mL  30 mL Oral Q4H PRN Worthy Flank, NP      . FLUoxetine (PROZAC) capsule 20 mg  20 mg Oral Daily Shuvon B Rankin, NP   20 mg at 06/13/14 0747  . hydrOXYzine (ATARAX/VISTARIL) tablet 25 mg  25 mg Oral TID PRN Worthy Flank, NP   25 mg at 06/13/14 0948  . magnesium hydroxide (MILK OF MAGNESIA) suspension 30 mL  30 mL Oral Daily PRN Worthy Flank, NP      . oxyCODONE (Oxy IR/ROXICODONE) immediate release tablet 7.5 mg  7.5 mg Oral Q4H PRN Rachael Fee, MD   7.5 mg at 06/13/14 1610    Lab Results:  No results found for this or any previous visit (from the past 48 hour(s)).  Physical Findings: AIMS: Facial and Oral Movements Muscles of Facial Expression: None, normal Lips and Perioral Area: None, normal Jaw: None, normal Tongue: None, normal,Extremity Movements Upper (arms, wrists, hands, fingers): None, normal Lower (legs, knees, ankles, toes): None, normal, Trunk Movements Neck, shoulders, hips: None, normal, Overall Severity Severity of abnormal movements (highest score from questions above): None, normal Incapacitation due to abnormal movements: None, normal Patient's awareness of abnormal movements (rate only patient's report): No Awareness, Dental Status Current problems with teeth and/or dentures?: No Does patient usually wear dentures?: No  CIWA:    COWS:      Treatment Plan Summary: Daily contact with patient to assess and evaluate symptoms and progress in treatment and Medication management  Supportive approach/coping skills PTSD; help process the trauma/will start Prozac 20 mg daily will use CBT/mindfulness  Depression; will use the Prozac as it can approach both the depression and the PTSD and is affordable Back pain; will resume the Roxicodone and explore using other approaches to help him with the pain  06/12/2014  Will increase Prozac to 20 mg 06/13/2014  

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
## 2014-06-13 NOTE — Progress Notes (Signed)
Morning Wellness Group 0930  The focus of this group is to educate the patient on the purpose and policies of crisis stabilization and provide a format to answer questions about their admission.  The group details unit policies and expectations of patients while admitted.  During group, patient reports being able to identify one positive support system in his life.

## 2014-06-13 NOTE — Progress Notes (Signed)
Adult Psychoeducational Group Note  Date:  06/13/2014 Time:  1030  Group Topic/Focus:  Making Healthy Choices:   The focus of this group is to help patients identify negative/unhealthy choices they were using prior to admission and identify positive/healthier coping strategies to replace them upon discharge.  Participation Level:  Active  Participation Quality:  Appropriate and Supportive  Affect:  Appropriate  Cognitive:  Alert, Appropriate and Oriented  Insight: Appropriate  Engagement in Group:  Supportive  Modes of Intervention:  Discussion and Education  Additional Comments:  Pt identified one positive way to intervene in their potentially unhealthy coping patterns  Florene Brill MCCOLLUM 06/13/2014, 11:10 AM

## 2014-06-13 NOTE — Progress Notes (Signed)
[facility] Group Notes:  (Nursing/MHT/Case Management/Adjunct)  Date:  06/13/2014  Time:  9:10 PM  Type of Therapy:  Psychoeducational Skills  Participation Level:  Active  Participation Quality:  Appropriate  Affect:  Appropriate  Cognitive:  Appropriate  Insight:  Appropriate and Good  Engagement in Group:  Engaged  Modes of Intervention:  Discussion  Summary of Progress/Problems: In Wrap up group, Miguel [name] mentioned that his day was an 8 and once discharged he is going to being NA Meetings and wants to talk to the Doc and SW about NA meetings and finding support once discharged.  Madaline Savageiamond N Alexx Giambra 06/13/2014, 9:10 PM

## 2014-06-13 NOTE — Progress Notes (Signed)
Patient ID: Miguel [name], male   DOB: 12/11/1990, [age] y.o.   MRN: [id number]  Pt currently presents with a masked affect and anxious/depressed behavior. Per self inventory, pt rates depression at a 8, hopelessness 7 and anxiety 10. Pt's daily goal is to "get on these medications that work." Pt reports "I keep hearing a man come into room and calling my name, when I look up, no one is there. This happened when I was on Wellbutrin." Pt reports poor sleep, poor concentration and a fair appetite. Pt frequently asks for medications throughout the day.   Pt provided with medications per providers orders. Pt's labs and vitals were monitored throughout the day. Pt supported emotionally and encouraged to express concerns and questions. Pt educated on medications. NP notified of patient reports of AH.   Pt's safety ensured with 15 minute and environmental checks. Pt currently denies SI/HI and A/V hallucinations. Pt verbally agrees to seek staff if SI/HI or A/VH occurs and to consult with staff before acting on these thoughts. Pt reports no relief from pain medications. Pt seen standing at nurses station looking at a magazine and laughing in the dayroom with the 300 hall patients.

## 2014-06-14 DIAGNOSIS — R45851 Suicidal ideations: Secondary | ICD-10-CM

## 2014-06-14 DIAGNOSIS — F332 Major depressive disorder, recurrent severe without psychotic features: Principal | ICD-10-CM

## 2014-06-14 MED ORDER — HYDROXYZINE HCL 25 MG PO TABS
25.0000 mg | ORAL_TABLET | Freq: Four times a day (QID) | ORAL | Status: DC | PRN
  Administered 2014-06-15 – 2014-06-16 (×3): 25 mg via ORAL
  Filled 2014-06-14 (×2): qty 1
  Filled 2014-06-14: qty 10
  Filled 2014-06-14: qty 1

## 2014-06-14 MED ORDER — METHOCARBAMOL 500 MG PO TABS: 500.0000 mg | ORAL_TABLET | Freq: Three times a day (TID) | ORAL | Status: DC | PRN

## 2014-06-14 MED ORDER — LOPERAMIDE HCL 2 MG PO CAPS: 2.0000 mg | ORAL_CAPSULE | ORAL | Status: DC | PRN

## 2014-06-14 MED ORDER — ONDANSETRON 4 MG PO TBDP: 4.0000 mg | ORAL_TABLET | Freq: Four times a day (QID) | ORAL | Status: DC | PRN

## 2014-06-14 MED ORDER — METHOCARBAMOL 500 MG PO TABS
500.0000 mg | ORAL_TABLET | Freq: Three times a day (TID) | ORAL | Status: DC | PRN
  Administered 2014-06-14 – 2014-06-17 (×6): 500 mg via ORAL
  Filled 2014-06-14: qty 1
  Filled 2014-06-14: qty 10
  Filled 2014-06-14 (×5): qty 1

## 2014-06-14 MED ORDER — CLONIDINE HCL 0.1 MG PO TABS
0.1000 mg | ORAL_TABLET | ORAL | Status: DC
  Administered 2014-06-16 – 2014-06-17 (×2): 0.1 mg via ORAL
  Filled 2014-06-14 (×4): qty 1

## 2014-06-14 MED ORDER — DICYCLOMINE HCL 20 MG PO TABS: 20.0000 mg | ORAL_TABLET | Freq: Four times a day (QID) | ORAL | Status: DC | PRN

## 2014-06-14 MED ORDER — NAPROXEN 500 MG PO TABS
500.0000 mg | ORAL_TABLET | Freq: Two times a day (BID) | ORAL | Status: DC | PRN
  Administered 2014-06-14: 500 mg via ORAL
  Filled 2014-06-14: qty 1

## 2014-06-14 MED ORDER — CLONIDINE HCL 0.1 MG PO TABS
0.1000 mg | ORAL_TABLET | Freq: Four times a day (QID) | ORAL | Status: AC
  Administered 2014-06-14 – 2014-06-16 (×8): 0.1 mg via ORAL
  Filled 2014-06-14 (×11): qty 1

## 2014-06-14 MED ORDER — HYDROXYZINE HCL 25 MG PO TABS: 25.0000 mg | ORAL_TABLET | Freq: Four times a day (QID) | ORAL | Status: DC | PRN

## 2014-06-14 MED ORDER — LOPERAMIDE HCL 2 MG PO CAPS
2.0000 mg | ORAL_CAPSULE | ORAL | Status: DC | PRN
Start: 2014-06-14 — End: 2014-06-17
  Administered 2014-06-14: 4 mg via ORAL
  Administered 2014-06-15 (×2): 2 mg via ORAL
  Filled 2014-06-14: qty 1
  Filled 2014-06-14: qty 2
  Filled 2014-06-14: qty 1

## 2014-06-14 MED ORDER — NAPROXEN 500 MG PO TABS: 500.0000 mg | ORAL_TABLET | Freq: Two times a day (BID) | ORAL | Status: DC | PRN

## 2014-06-14 MED ORDER — CLONIDINE HCL 0.1 MG PO TABS
0.1000 mg | ORAL_TABLET | Freq: Every day | ORAL | Status: DC
  Filled 2014-06-14: qty 3
  Filled 2014-06-14: qty 1

## 2014-06-14 NOTE — Progress Notes (Signed)
[facility] Assessment Progress Note     Patient stated that he had a good day today. Says that he was happy to see his family on visitation. Patients stated that he wanted to get off of narcotics. Says that he has a plan for discharge and that he didn't want to elaborate on it at the time.

## 2014-06-14 NOTE — Clinical Social Work Note (Signed)
CSW spoke with Theta at [facility] [place].  She advised they can take patient if he can show proof insurance has been paid for May.  She also stated he would need a $1,700 deposit but they would be willing to work with him if the does not have the money.

## 2014-06-14 NOTE — [facility] Group Notes (Signed)
[facility]   06/14/2014 8:39 AM    Participation Quality:  Appropraite  Mood/Affect:  Appropriate  Depression Rating:  8  Anxiety Rating:  8  Thoughts of Suicide:  No  Will you contract for safety?   NA  Current AVH:  No  Plan for Discharge/Comments:  Patient attended discharge planning group and actively participated in group. Patient advised he has been abusing alcohol/drugs and is requesting residential treatment. Suicide prevention education reviewed and SPE document provided.   Transportation Means: Patient has transportation.   Supports:  Patient has a support system.   Cleon Thoma, Joesph JulyQuylle Hairston

## 2014-06-14 NOTE — Progress Notes (Signed)
D:Pt has requested pain medication twice this morning. Pt reports that he plans to have the doctor start lowering his pain medication to stop taking narcotics. Pt's mood is labile at times. He was requested twice to take morning scheduled medication before coming to the medication window. A:Offered support, encouragement and 15 minute checks. R: Pt denies si and hi this morning. Safety maintained on the unit.

## 2014-06-14 NOTE — Progress Notes (Signed)
D. Pt had been up and visible in milieu, attended and participated in evening group activities. Pt spoke about going to a treatment facility when he gets discharged and wanting to go to NA groups and spoke about how his drug of choice is pain pills and spoke of how easy they are to obtain on the streets. Pt did receive all medications this evening without incident. A. Support and encouragement provided. R. Safety maintained, will continue to monitor.

## 2014-06-14 NOTE — Plan of Care (Signed)
Problem: Alteration in mood Goal: STG-Patient reports thoughts of self-harm to staff Outcome: Progressing Pt verbally denies si thoughts this morning.
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
## 2014-06-14 NOTE — Progress Notes (Signed)
Recreation Therapy Notes  Date: 06/14/14 Time: 9:30am Location: [address] Dayroom  Group Topic: Stress Management  Goal Area(s) Addresses:  Patient will verbalize importance of using healthy stress management.  Patient will identify positive emotions associated with healthy stress management.   Behavioral Response: None   Intervention: Stress Management  Activity :  Progressive Muscle Relaxation.  LRT introduced and educated patients on progressive muscle relaxation.  A script was used to deliver the technique to patients.  Patients were asked to follow the script read aloud by LRT to engage in practicing the stress management technique.  Education:  Stress Management, Discharge Planning.   Education Outcome: Acknowledges edcuation/In group clarification offered/Needs additional education  Clinical Observations/Feedback: Patient did not attend group.  Osiris Charles, LRT/CTRS         Kynsli Haapala A 06/14/2014 4:35 PM 

## 2014-06-14 NOTE — Progress Notes (Signed)
Patient ID: Miguel [name], male   DOB: [birth year], 24 y.o.   MRN: [id number] [facility] MD Progress Note  06/14/2014 4:36 PM Miguel [name]  MRN:  [id number]   Subjective:  Patient states he is " a little better" but describes ongoing chronic symptoms of PTSD, such as having frequent intrusive memories, nightmares ,  and ruminations about her daughter's death ( who died at age 52 months in 2011/05/03) . He also reports some ongoing depression and a lingering feeling of sadness. His major concern at this time refers to opiate dependence. States he has been abusing opiates prior to admission and was taking up to 10 oxycodones per day ( which he was crushing and snorting). States he is wanting to " get clean for good" and is wanting to go to an inpatient rehab after discharge from unit.  He states " I have tried to stop on my own before , but I can't because of the Withdrawal symptoms". Objective : I have discussed case with treatment team and have met with patient. Patient has continued to present depressed, and to describe high level of anxiety and depression, which he characterized as 8/10.  Patient has been focusing on going to an inpatient rehab and initiating opiate detox. He states " I am tired of using , I need to get better and be more " there" for my kid and my girlfriend". He has tended to remain somewhat isolative, but has been going to groups . Patient reports, as above, some ongoing depression, ongoing PTSD symptoms, and a history of opiate abuse which has been progressing overtime to a pattern of dependence. He has been on opiates on unit, for back pain. Ge states, however, that he is increasingly motivated in going to an inpatient rehab after discharge, and states " I know they are not going to take me on narcotics, so I really want to detox". Denies medication side effects at this time .     Principal Problem: Severe recurrent major depression without psychotic features Diagnosis:    Patient Active Problem List   Diagnosis Date Noted  . Nightmares [F51.5]   . PTSD (post-traumatic stress disorder) [F43.10] 06/11/2014  . Severe recurrent major depression without psychotic features [F33.2] 06/11/2014   Total Time spent with patient: 25 minutes    Past Medical History:  Past Medical History  Diagnosis Date  . Hand fracture, right 05-03-2006    no surgery required  . Hand fracture, left 05-02-08  . Depression     Past Surgical History  Procedure Laterality Date  . Left hand fracture Left May 02, 2008    Dr Ninfa Linden  . Cyst excision     Family History: History reviewed. No pertinent family history. Social History:  History  Alcohol Use No    Comment: He drank this time in effort to hurt self/ usually non drinker     History  Drug Use No    History   Social History  . Marital Status: Single    Spouse Name: N/A  . Number of Children: N/A  . Years of Education: N/A   Social History Main Topics  . Smoking status: Never Smoker   . Smokeless tobacco: Never Used  . Alcohol Use: No     Comment: He drank this time in effort to hurt self/ usually non drinker  . Drug Use: No  . Sexual Activity: Yes    Birth Control/ Protection: None   Other Topics Concern  . None   Social  History Narrative   Additional History:    Sleep: improving   Appetite:  Fair   Assessment:   Musculoskeletal: Strength & Muscle Tone: within normal limits Gait & Station: normal Patient leans: N/A   Psychiatric Specialty Exam: Physical Exam  Nursing note and vitals reviewed. Constitutional: He is oriented to person, place, and time.  Neck: Normal range of motion.  Respiratory: Effort normal.  Musculoskeletal: Normal range of motion.  Neurological: He is alert and oriented to person, place, and time.  Psychiatric: His speech is normal. He is actively hallucinating. Cognition and memory are normal. He exhibits a depressed mood. He expresses suicidal ideation.    ROS denies dizziness   ( on low dose Minipress ) , denies any GI symptoms at this time .   Blood pressure 116/60, pulse 70, temperature 98 F (36.7 C), temperature source Oral, resp. rate 18, height 5' 6.5" (1.689 m), weight 311 lb (141.069 kg).Body mass index is 49.45 kg/(m^2).  General Appearance: Casual  Eye Contact::  Fair  Speech:  Clear and Coherent and Normal Rate  Volume:  Normal  Mood:  Depressed  Affect:  Constricted/sad    Thought Process:  Linear  Orientation:  Full (Time, Place, and Person)  Thought Content:  states he has been hearing " my baby cry", but at this time not internally preoccupied   Suicidal Thoughts:  Yes.  without intent/plan- denies any current plan or intention of hurting self and contracts for safety on the unit .   Homicidal Thoughts:  No  Memory:  Immediate;   Good Recent;   Good Remote;   Good  Judgement:  Poor  Insight:  Fair  Psychomotor Activity:  Normal  Concentration:  Fair  Recall:  Good  Fund of Knowledge:Fair  Language: Good  Akathisia:  No  Handed:  Right  AIMS (if indicated):     Assets:  Communication Skills Housing  ADL's:  Intact  Cognition: WNL  Sleep:  Number of Hours: 6.75     Current Medications: Current Facility-Administered Medications  Medication Dose Route Frequency Provider Last Rate Last Dose  . acetaminophen (TYLENOL) tablet 650 mg  650 mg Oral Q6H PRN Kerrie Buffalo, NP   650 mg at 06/14/14 1143  . alum & mag hydroxide-simeth (MAALOX/MYLANTA) 200-200-20 MG/5ML suspension 30 mL  30 mL Oral Q4H PRN Harriet Butte, NP      . FLUoxetine (PROZAC) capsule 20 mg  20 mg Oral Daily Shuvon B Rankin, NP   20 mg at 06/14/14 1020  . hydrOXYzine (ATARAX/VISTARIL) tablet 25 mg  25 mg Oral TID PRN Harriet Butte, NP   25 mg at 06/14/14 1438  . magnesium hydroxide (MILK OF MAGNESIA) suspension 30 mL  30 mL Oral Daily PRN Harriet Butte, NP      . oxyCODONE (Oxy IR/ROXICODONE) immediate release tablet 7.5 mg  7.5 mg Oral Q4H PRN Nicholaus Bloom, MD   7.5  mg at 06/14/14 1023  . prazosin (MINIPRESS) capsule 1 mg  1 mg Oral QHS Shuvon B Rankin, NP   1 mg at 06/13/14 2123    Lab Results:  No results found for this or any previous visit (from the past 48 hour(s)).  Physical Findings: AIMS: Facial and Oral Movements Muscles of Facial Expression: None, normal Lips and Perioral Area: None, normal Jaw: None, normal Tongue: None, normal,Extremity Movements Upper (arms, wrists, hands, fingers): None, normal Lower (legs, knees, ankles, toes): None, normal, Trunk Movements Neck, shoulders, hips: None, normal, Overall  Severity Severity of abnormal movements (highest score from questions above): None, normal Incapacitation due to abnormal movements: None, normal Patient's awareness of abnormal movements (rate only patient's report): No Awareness, Dental Status Current problems with teeth and/or dentures?: No Does patient usually wear dentures?: No  CIWA:    COWS:      Assessment- at this time remains depressed, with constricted affect and a sense of sadness, but not currently suicidal and is future oriented, wanting to go to an inpatient rehabilitation program after discharge to address opiate dependence.  Also describes chronic PTSD symptoms, as above.  He is wanting to in initiate opiate detox at this time- seems motivated in sobriety at present. Thus far tolerating Prozac well, and states it helped him during a previous trial .  Treatment Plan Summary: Daily contact with patient to assess and evaluate symptoms and progress in treatment and Medication management  Continue  Prozac 20 mgrs QAM for Depression and PTSD . Will D/C Minipress 1 mgrs QHS( for PTSD related insomnia/nightmares ) to minimize risk of hypotension as being started on Clonidine detox taper. Will consider restarting after clonidine taper is completed . D/C  Oxycodone .   Start Clonidine detox as per protocol, at patient's request , to address opiate dependence / minimize risk of  opiate WDL.  Patient interested in going to an inpatient rehab after discharge to address opiate dependence. SW working on options . On Robaxin PRNs for lower back pain, muscular spasms Will continue with current treatment plan  Medical Decision Making:  Review of Psycho-Social Stressors (1), Review of Last Therapy Session (1), Review of Medication Regimen & Side Effects (2) and Review of New Medication or Change in Dosage (2)   Havah Ammon 06/14/2014, 4:36 PM

## 2014-06-15 NOTE — Progress Notes (Signed)
Pt attended spiritual care group on grief and loss facilitated by chaplain Burnis KingfisherMatthew Rachelle Edwards. Group opened with brief discussion and psycho-social ed around grief and loss in relationships and in relation to self - identifying life patterns, circumstances, changes that cause losses. Established group norm of speaking from own life experience. Group goal of establishing open and affirming space for members to share loss and experience with grief, normalize grief experience and provide psycho social education and grief support.  Group drew on narrative and Alderian therapeutic modalities.   Hillman was present throughout group.  He presented with flat affect and did not engage other group members or contribute to group.   Belva CromeStalnaker, Lasean Rahming Wayne MDiv

## 2014-06-15 NOTE — Clinical Social Work Note (Signed)
CSW spoke with Kara Meadmma at [facility] to advised her our UR person, Onnie BoerJennifer Clark, contacted [employer] at [phone] to confirm patient's insurance paid through May.  Emma informed, per Victorino DikeJennifer, patient insurance is in place through the end of June.  Kara Meadmma given the phone number to call if they needed to verify coverage.  [facility].

## 2014-06-15 NOTE — Plan of Care (Signed)
Problem: Consults Goal: Depression Patient Education See Patient Education Module for education specifics.  Outcome: Completed/Met Date Met:  06/15/14 Nurse discussed depression/coping skills with patient.

## 2014-06-15 NOTE — [facility] Group Notes (Signed)
The focus of this group is to educate the patient on the purpose and policies of crisis stabilization and provide a format to answer questions about their admission.  The group details unit policies and expectations of patients while admitted.  Patient did not attend 0900 nurse education orientation group this morning.  Patient stayed in his room. 

## 2014-06-15 NOTE — Progress Notes (Signed)
Recreation Therapy Notes  Animal-Assisted Activity (AAA) Program Checklist/Progress Notes Patient Eligibility Criteria Checklist & Daily Group note for Rec Tx Intervention  Date: 06/15/14 Time: 2:30pm Location: [address]   AAA/T Program Assumption of Risk Form signed by Patient/ or Parent Legal Guardian YES   Patient is free of allergies or sever asthma YES   Patient reports no fear of animals YES  Patient reports no history of cruelty to animals YES   Patient understands his/her participation is voluntary YES   Patient washes hands before animal contact YES   Patient washes hands after animal contact YES  Behavioral Response: Appropriate, engaged  Education: Charity fundraiserHand Washing, Appropriate Animal Interaction   Education Outcome: Acknowledges understanding/In group clarification offered/Needs additional education.   Clinical Observations/Feedback: Patient came at 3:20pm and sat in a chair.  Patient asked questions but did not pet the dog.  Miguel [name], Miguel [name], Miguel [name]         Miguel [name], Miguel [name] A 06/15/2014 4:23 PM

## 2014-06-15 NOTE — Tx Team (Signed)
Interdisciplinary Treatment Plan Update (Adult)  Date:  06/15/2014  Time Reviewed:  8:49 AM   Progress in Treatment: Attending groups: Patient is attending groups. Participating in groups:  Patient engages in discussion Taking medication as prescribed:  Patient is taking medications Tolerating medication:  Patient is tolerating medications Family/Significant othe contact made:   No, patient declined collateral contact Patient understands diagnosis:Yes, patient understands diagnosis and need for treatment Discussing patient identified problems/goals with staff:  Yes, patient is able to express goals/problems Medical problems stabilized or resolved:  Yes Denies suicidal/homicidal ideation: Yes, patient is denying SI/HI. Issues/concerns per patient self-inventory:   Other:   Discharge Plan or Barriers:  Referral made for residential treatment  Reason for Continuation of Hospitalization: Anxiety Depression Medication stabilization  Comments:  Continue medication stabilization   Additional comments:  Patient and CSW reviewed Patient Discharge Process Letter/Patient Involvement Form.  Patient verbalized understanding and signed form.  Patient and CSW also reviewed and identified patient's goals and treatment plan.  Patient verbalized understanding and agreed to plan.  Estimated length of stay:  2-3  days  New goal(s):  Review of initial/current patient goals per problem list:     Attendees: Patient 06/15/2014 8:49 AM   Family:   06/15/2014 8:49 AM   Physician:  Nehemiah MassedFernando Cobos, MD 06/15/2014 8:49 AM   Nursing:   Quintella ReichertBeverly Knight, RN 06/15/2014 8:49 AM   Clinical Social Worker:  Juline PatchQuylle Kasim Mccorkle, LCSW 06/15/2014 8:49 AM   Clinical Social Worker:  Belenda CruiseKristin Drinkard, LCSW-A 06/15/2014 8:49 AM   Case Manager:  Onnie BoerJennifer Clark, RN 06/15/2014 8:49 AM   Other:  Liborio NixonPatrice White 06/15/2014 8:49 AM  Other:   06/15/2014  8:49 AM   Other:  06/15/2014 8:49 AM   Other:  06/15/2014 8:49 AM   Other:   06/15/2014 8:49 AM   Other:  Chad CordialValerie Enoch, [facility] Transition Team Coordinator 06/15/2014 8:49 AM   Other:   06/15/2014 8:49 AM   Other:  06/15/2014 8:49 AM   Other:   06/15/2014 8:49 AM    Scribe for Treatment Team:   Wynn BankerHodnett, Darrion Wyszynski Hairston, 06/15/2014   8:49 AM

## 2014-06-15 NOTE — Progress Notes (Signed)
Patient ID: [name], male   DOB: 02-16-1990, 24 y.o.   MRN: [id number] [facility] MD Progress Note  06/15/2014 11:31 AM Jaxon Aden Sek  MRN:  [id number]   Subjective:   Reports ongoing depression and dysphoria but states he is satisfied he has made decision to detox off opiates and try to go to an inpatient Rehab after discharge. Describes some symptoms of opiate WDL, such as cramps, nausea, muscle aches, but denies vomiting . He does feel Clonidine " helps keep the edge off " the withdrawal. PTSD symptoms are chronic , as well as at times , particularly in evening , hearing baby cries, but states these symptoms partially improved . Denies medication side effects at this time . Objective : I have discussed case with treatment team and have met with patient. Patient reporting symptoms of opiate WDL as above . He does not appear restless or in any acute distress. Mood remains depressed, but states he is feeling " a little better" and denies any SI. Does endorse ongoing sense of anhedonia, and ongoing sense of grief/loss regarding the death of his infant daughter. At this time does not appear internally preoccupied. No disruptive behaviors on unit- some group participation, but at times isolates in room. Remains focused on going to an inpatient rehab setting to address opiate dependence, and states his 47 month old son is a major motivator for him to try to consolidate sobriety- " I want to be a better father".  Denies medication side effects .     Principal Problem: Severe recurrent major depression without psychotic features Diagnosis:   Patient Active Problem List   Diagnosis Date Noted  . Nightmares [F51.5]   . PTSD (post-traumatic stress disorder) [F43.10] 06/11/2014  . Severe recurrent major depression without psychotic features [F33.2] 06/11/2014   Total Time spent with patient: 25 minutes    Past Medical History:  Past Medical History  Diagnosis Date  . Hand fracture,  right 2008    no surgery required  . Hand fracture, left 2010  . Depression     Past Surgical History  Procedure Laterality Date  . Left hand fracture Left 2010    Dr Ninfa Linden  . Cyst excision     Family History: History reviewed. No pertinent family history. Social History:  History  Alcohol Use No    Comment: He drank this time in effort to hurt self/ usually non drinker     History  Drug Use No    History   Social History  . Marital Status: Single    Spouse Name: N/A  . Number of Children: N/A  . Years of Education: N/A   Social History Main Topics  . Smoking status: Never Smoker   . Smokeless tobacco: Never Used  . Alcohol Use: No     Comment: He drank this time in effort to hurt self/ usually non drinker  . Drug Use: No  . Sexual Activity: Yes    Birth Control/ Protection: None   Other Topics Concern  . None   Social History Narrative   Additional History:    Sleep: improving   Appetite:  Fair   Assessment:   Musculoskeletal: Strength & Muscle Tone: within normal limits Gait & Station: normal Patient leans: N/A   Psychiatric Specialty Exam: Physical Exam  Nursing note and vitals reviewed. Constitutional: He is oriented to person, place, and time.  Neck: Normal range of motion.  Respiratory: Effort normal.  Musculoskeletal: Normal range of motion.  Neurological: He is alert and oriented to person, place, and time.  Psychiatric: His speech is normal. He is actively hallucinating. Cognition and memory are normal. He exhibits a depressed mood. He expresses suicidal ideation.    ROS  Some cramps, muscle aches, nausea, associated with opiate withdrawal. No vomiting or severe diarrhea endorsed .   Blood pressure 128/73, pulse 63, temperature 97.5 F (36.4 C), temperature source Oral, resp. rate 18, height 5' 6.5" (1.689 m), weight 311 lb (141.069 kg).Body mass index is 49.45 kg/(m^2).  General Appearance: Casual  Eye Contact::  Fair  Speech:   Clear and Coherent and Normal Rate  Volume:  Normal  Mood:  Depressed, but states he is feeling " a little better"   Affect:   Mildly dysphoric and still constricted     Thought Process:  Linear  Orientation:  Full (Time, Place, and Person)  Thought Content:  states he has been hearing " my baby cry", but at this time not internally preoccupied   Suicidal Thoughts:  No- denies any current plan or intention of hurting self and contracts for safety on the unit .   Homicidal Thoughts:  No  Memory:  Immediate;   Good Recent;   Good Remote;   Good  Judgement:  Poor  Insight:  Fair  Psychomotor Activity: decreased   Concentration:  Fair  Recall:  Good  Fund of Knowledge:Fair  Language: Good  Akathisia:  No  Handed:  Right  AIMS (if indicated):     Assets:  Communication Skills Housing  ADL's:  Intact  Cognition: WNL  Sleep:  Number of Hours: 6.75     Current Medications: Current Facility-Administered Medications  Medication Dose Route Frequency Provider Last Rate Last Dose  . acetaminophen (TYLENOL) tablet 650 mg  650 mg Oral Q6H PRN Kerrie Buffalo, NP   650 mg at 06/14/14 1143  . alum & mag hydroxide-simeth (MAALOX/MYLANTA) 200-200-20 MG/5ML suspension 30 mL  30 mL Oral Q4H PRN Harriet Butte, NP      . cloNIDine (CATAPRES) tablet 0.1 mg  0.1 mg Oral QID Jenne Campus, MD   0.1 mg at 06/15/14 0752   Followed by  . [START ON 06/16/2014] cloNIDine (CATAPRES) tablet 0.1 mg  0.1 mg Oral BH-qamhs Jenne Campus, MD       Followed by  . [START ON 06/19/2014] cloNIDine (CATAPRES) tablet 0.1 mg  0.1 mg Oral QAC breakfast Myer Peer Cobos, MD      . dicyclomine (BENTYL) tablet 20 mg  20 mg Oral Q6H PRN Jenne Campus, MD      . FLUoxetine (PROZAC) capsule 20 mg  20 mg Oral Daily Shuvon B Rankin, NP   20 mg at 06/15/14 7867  . hydrOXYzine (ATARAX/VISTARIL) tablet 25 mg  25 mg Oral Q6H PRN Myer Peer Cobos, MD      . loperamide (IMODIUM) capsule 2-4 mg  2-4 mg Oral PRN Jenne Campus, MD   4 mg at 06/14/14 1824  . magnesium hydroxide (MILK OF MAGNESIA) suspension 30 mL  30 mL Oral Daily PRN Harriet Butte, NP      . methocarbamol (ROBAXIN) tablet 500 mg  500 mg Oral Q8H PRN Jenne Campus, MD   500 mg at 06/14/14 1824  . naproxen (NAPROSYN) tablet 500 mg  500 mg Oral BID PRN Jenne Campus, MD   500 mg at 06/14/14 2055  . ondansetron (ZOFRAN-ODT) disintegrating tablet 4 mg  4 mg Oral Q6H PRN Felicita Gage  A Cobos, MD        Lab Results:  No results found for this or any previous visit (from the past 48 hour(s)).  Physical Findings: AIMS: Facial and Oral Movements Muscles of Facial Expression: None, normal Lips and Perioral Area: None, normal Jaw: None, normal Tongue: None, normal,Extremity Movements Upper (arms, wrists, hands, fingers): None, normal Lower (legs, knees, ankles, toes): None, normal, Trunk Movements Neck, shoulders, hips: None, normal, Overall Severity Severity of abnormal movements (highest score from questions above): None, normal Incapacitation due to abnormal movements: None, normal Patient's awareness of abnormal movements (rate only patient's report): No Awareness, Dental Status Current problems with teeth and/or dentures?: No Does patient usually wear dentures?: No  CIWA:  CIWA-Ar Total: 1 COWS:  COWS Total Score: 1   Assessment-  Patient continues to experience symptoms of PTSD and depression, but states there has been some improvement with current medication regimen, which he is tolerating well thus far.  He is wanting to proceed with opiate detox , and is tolerating Clonidine detox protocol well, although does endorse some symptoms of opiate WDL. Remains invested in going to an inpatient rehab setting after discharge if possible.Of note, Patient's report of hearing baby cry at times appears related to PTSD/intrusive recollection and patient does not appear internally preoccupied nor presenting with any psychotic symptoms at this time.     Treatment Plan Summary: Daily contact with patient to assess and evaluate symptoms and progress in treatment and Medication management  Continue  Prozac 20 mgrs QAM for Depression and  For PTSD . Continue Clonidine detox as per protocol for Opiate  Withdrawal  Patient interested in going to an inpatient rehab after discharge to address opiate dependence. SW working on options . Continue Robaxin PRNs / Naprosyn PRNS for lower back pain, muscular spasms Vistaril PRNs for Anxiety    Medical Decision Making:  Review of Psycho-Social Stressors (1), Review of Last Therapy Session (1) and Review of Medication Regimen & Side Effects (2)   COBOS, FERNANDO 06/15/2014, 11:31 AM

## 2014-06-15 NOTE — Progress Notes (Addendum)
D:Patient in the hallway on approach.  Patient appears flat but states he had a good day.  Patient states he is happy that he got into a rehab facility in [place].  Patient states, "I want to get off the pain medications."  Patient denies SI/HI but states he has auditory hallucinations.  "I hear a baby crying." A: Staff to monitor Q 15 mins for safety.  Encouragement and support offered.  Scheduled medications administered per orders.  Imodium administered prn for diarrhea.  Robaxin administered prn for muscle spasms. R: Patient remains safe on the unit.  Patient attended group tonight.  Patient visible on the unit and interacting with peers.  Patient taking administered medications.

## 2014-06-15 NOTE — Progress Notes (Signed)
D. Pt had been up and visible in milieu this evening, did attend and participate in evening group activity. Pt spoke about how he wants to get into a treatment facility in [place] when he gets discharged from here and spoke about his need to get clean and stay away from pain pills. Pt reported feeling ok this evening, no overt signs or symptoms of withdrawal present but pt does expect to experience them in the coming days as he reported that he has gone through this before. Pt did receive medications without incident. A. Support and encouragement provided, medication education provided. R. Pt verbalized understanding, safety maintained.

## 2014-06-15 NOTE — Progress Notes (Signed)
[facility] Group Notes:  (Nursing/MHT/Case Management/Adjunct)  Date:  06/15/2014  Time:  9:41 PM  Type of Therapy:  Psychoeducational Skills  Participation Level:  Active  Participation Quality:  Appropriate  Affect:  Appropriate  Cognitive:  Appropriate  Insight:  Appropriate and Good  Engagement in Group:  Engaged  Modes of Intervention:  Discussion  Summary of Progress/Problems: Tonight in Wrap up group Miguel [name] stated that his day was an 8 (on a scale of 1-10-best). The theme was recovery and he mentioned that Thursday he would be discharged and was going directly to  [facility] where he would be admitted into a rehab facility for 28 days, after leaving there he said he was going to stay away from his family because they are a trigger for him.  He also spoke highly of his SW. Madaline SavageDiamond N Jackie Littlejohn 06/15/2014, 9:41 PM

## 2014-06-15 NOTE — [facility] Group Notes (Signed)
[facility]  06/15/2014 3:01 PM  Type of Therapy:  Group Therapy  Participation Level:  Did Not Attend  Miguel [name], Miguel [name] 06/15/2014, 3:01 PM

## 2014-06-15 NOTE — Progress Notes (Addendum)
D:  Patient stayed in bed this morning.  Patient denied SI and HI, contracts for safety.  Denied visual hallucinations.  Stated he does hear voice of crying baby. A:  Patient took his morning medications in bed.  Emotional support and encouragement given patient. R:  Safety maintained with 15 minute checks. Patient wants to go to rehab after discharge.  [place]

## 2014-06-16 NOTE — [facility] Group Notes (Signed)
[facility]   06/16/2014   3:51 PM  Participation Quality:  Patient did not attend group.  Wynn BankerHodnett, Kanav Kazmierczak Hairston 06/16/2014 3:51 PM

## 2014-06-16 NOTE — Progress Notes (Signed)
Patient ID: Miguel [name], male   DOB: 1990-10-16, 24 y.o.   MRN: [id number] [facility] MD Progress Note  06/16/2014 4:54 PM [name]  MRN:  [id number]   Subjective:  Patient states he is feeling better. Opiate WDL symptoms are currently mild, and denies severe nausea, no vomiting, and good PO intake. Although he states he remains depressed, he does acknowledge he feels better, and is future oriented, looking forward to going to an inpatient rehab after discharge. Denies medication side effects at this time. Objective : I have discussed case with treatment team and have met with patient. Patient gradually improving: although still depressed and with a somewhat constricted affect , he is more reactive in affect at this time and he is more future oriented. Has reported little depression but has reported ongoing significant anxiety. Chronic PTSD symptoms partially improved- today did not describe , report auditory hallucination of a baby crying but does state that " not a day goes by that I don't think about it ".  No disruptive  Behaviors on unit- going to some groups, although participation tends to be limited . Looking forward to going to a rehab - specifically to [facility] in [place]. He is motivated in remaining sober and addressing opiate dependence. Of note, patient does not endorse increased pain off opiates, and states that muscle relaxer medication seems to be sufficient to adequately treat back pain at this time. Responsive to support and encouragement.     Principal Problem: Severe recurrent major depression without psychotic features Diagnosis:   Patient Active Problem List   Diagnosis Date Noted  . Nightmares [F51.5]   . PTSD (post-traumatic stress disorder) [F43.10] 06/11/2014  . Severe recurrent major depression without psychotic features [F33.2] 06/11/2014   Total Time spent with patient: 25 minutes    Past Medical History:  Past Medical History  Diagnosis Date  .  Hand fracture, right 2008    no surgery required  . Hand fracture, left 2010  . Depression     Past Surgical History  Procedure Laterality Date  . Left hand fracture Left 2010    Dr Ninfa Linden  . Cyst excision     Family History: History reviewed. No pertinent family history. Social History:  History  Alcohol Use No    Comment: He drank this time in effort to hurt self/ usually non drinker     History  Drug Use No    History   Social History  . Marital Status: Single    Spouse Name: N/A  . Number of Children: N/A  . Years of Education: N/A   Social History Main Topics  . Smoking status: Never Smoker   . Smokeless tobacco: Never Used  . Alcohol Use: No     Comment: He drank this time in effort to hurt self/ usually non drinker  . Drug Use: No  . Sexual Activity: Yes    Birth Control/ Protection: None   Other Topics Concern  . None   Social History Narrative   Additional History:    Sleep: improving   Appetite:  Improved    Assessment:   Musculoskeletal: Strength & Muscle Tone: within normal limits Gait & Station: normal Patient leans: N/A   Psychiatric Specialty Exam: Physical Exam  Nursing note and vitals reviewed. Constitutional: He is oriented to person, place, and time.  Neck: Normal range of motion.  Respiratory: Effort normal.  Musculoskeletal: Normal range of motion.  Neurological: He is alert and oriented to person,  place, and time.  Psychiatric: His speech is normal. He is actively hallucinating. Cognition and memory are normal. He exhibits a depressed mood. He expresses suicidal ideation.    ROS   Less cramps, no vomiting, no diarrhea endorsed today.  Blood pressure 118/63, pulse 83, temperature 98.2 F (36.8 C), temperature source Oral, resp. rate 18, height 5' 6.5" (1.689 m), weight 311 lb (141.069 kg).Body mass index is 49.45 kg/(m^2).  General Appearance: Casual  Eye Contact::  Good  Speech:  Clear and Coherent and Normal Rate   Volume:  Normal  Mood:  Still somewhat depressed but feeling better overall  Affect:   Constricted but more reactive     Thought Process:  Linear  Orientation:  Full (Time, Place, and Person)  Thought Content: today does not endorse hallucinations, does not appear internally preoccupied   Suicidal Thoughts:  No- denies any current plan or intention of hurting self and contracts for safety on the unit .   Homicidal Thoughts:  No  Memory:  Immediate;   Good Recent;   Good Remote;   Good  Judgement:  Poor  Insight:  Fair  Psychomotor Activity: improving   Concentration:  Fair  Recall:  Good  Fund of Knowledge:Fair  Language: Good  Akathisia:  No  Handed:  Right  AIMS (if indicated):     Assets:  Communication Skills Housing  ADL's:  Intact  Cognition: WNL  Sleep:  Number of Hours: 6.75     Current Medications: Current Facility-Administered Medications  Medication Dose Route Frequency Provider Last Rate Last Dose  . acetaminophen (TYLENOL) tablet 650 mg  650 mg Oral Q6H PRN Kerrie Buffalo, NP   650 mg at 06/14/14 1143  . alum & mag hydroxide-simeth (MAALOX/MYLANTA) 200-200-20 MG/5ML suspension 30 mL  30 mL Oral Q4H PRN Harriet Butte, NP      . cloNIDine (CATAPRES) tablet 0.1 mg  0.1 mg Oral BH-qamhs Jenne Campus, MD       Followed by  . [START ON 06/19/2014] cloNIDine (CATAPRES) tablet 0.1 mg  0.1 mg Oral QAC breakfast Myer Peer Xavier Fournier, MD      . dicyclomine (BENTYL) tablet 20 mg  20 mg Oral Q6H PRN Jenne Campus, MD      . FLUoxetine (PROZAC) capsule 20 mg  20 mg Oral Daily Shuvon B Rankin, NP   20 mg at 06/16/14 0851  . hydrOXYzine (ATARAX/VISTARIL) tablet 25 mg  25 mg Oral Q6H PRN Jenne Campus, MD   25 mg at 06/16/14 1300  . loperamide (IMODIUM) capsule 2-4 mg  2-4 mg Oral PRN Jenne Campus, MD   2 mg at 06/15/14 2112  . magnesium hydroxide (MILK OF MAGNESIA) suspension 30 mL  30 mL Oral Daily PRN Harriet Butte, NP      . methocarbamol (ROBAXIN) tablet 500  mg  500 mg Oral Q8H PRN Jenne Campus, MD   500 mg at 06/16/14 0839  . naproxen (NAPROSYN) tablet 500 mg  500 mg Oral BID PRN Jenne Campus, MD   500 mg at 06/14/14 2055  . ondansetron (ZOFRAN-ODT) disintegrating tablet 4 mg  4 mg Oral Q6H PRN Jenne Campus, MD        Lab Results:  No results found for this or any previous visit (from the past 48 hour(s)).  Physical Findings: AIMS: Facial and Oral Movements Muscles of Facial Expression: None, normal Lips and Perioral Area: None, normal Jaw: None, normal Tongue: None, normal,Extremity Movements Upper (  arms, wrists, hands, fingers): None, normal Lower (legs, knees, ankles, toes): None, normal, Trunk Movements Neck, shoulders, hips: None, normal, Overall Severity Severity of abnormal movements (highest score from questions above): None, normal Incapacitation due to abnormal movements: None, normal Patient's awareness of abnormal movements (rate only patient's report): No Awareness, Dental Status Current problems with teeth and/or dentures?: No Does patient usually wear dentures?: No  CIWA:  CIWA-Ar Total: 1 COWS:  COWS Total Score: 4   Assessment-  Gradual improvement- still depressed , but feeling better and affect less constricted. Chronic PTSD symptoms but seem to be improving as well, symptoms of opiate WDL are abating and at this time not presenting with any acute distress or discomfort. Tolerating medications well.   Treatment Plan Summary: Daily contact with patient to assess and evaluate symptoms and progress in treatment and Medication management  Continue  Prozac 20 mgrs QAM for Depression and  For PTSD . Continue Clonidine detox as per protocol for Opiate  Withdrawal  Continue Robaxin PRNs / Naprosyn PRNS for lower back pain, muscular spasms Vistaril PRNs for Anxiety Consider discharge to Rehab setting tomorrow if continues to stabilize     Medical Decision Making:  Review of Psycho-Social Stressors (1), Review  of Last Therapy Session (1) and Review of Medication Regimen & Side Effects (2)   Vendela Troung 06/16/2014, 4:54 PM

## 2014-06-16 NOTE — Progress Notes (Signed)
Recreation Therapy Notes  Date:  06/16/14 Time: 9:30am Location: [address] Hall Dayroom  Group Topic: Stress Management  Goal Area(s) Addresses:  Patient will actively participate in stress management techniques presented during session.   Intervention: Stress management techniques  Activity:  Guided Imagery. LRT read a script that walked patients through the technique Guided Imagery. Technique was coupled with deep breathing.   Education:  Stress Management, Discharge Planning.   Clinical Observations/Feedback: Patient did not attend group.   Shaylynne Lunt LRT/CTRS         Falicia Lizotte A 06/16/2014 3:46 PM 

## 2014-06-16 NOTE — Progress Notes (Addendum)
D. Pt. Took medication with much encouragement to get out of bed. Per patient self inventory he reports that he slept fair without sleep medications. He reports good appetite and concentration. He rates his depression 2/10, hopelessness 0/10, anxiety 8/10 on a scale of 1-10, 10 being the worse. Did not attend morning group. Pt reports muscle spasms.He reports he is ready to be d/c tomorrow to go to rehab.  Denies SI/HI. No reported A/V hallucinations.   A. Individual encouraged to get out of bed on several occasions by nursing staff. Counseling and encouragement given. Remains on special checks q 15 mins for safety. PRN medication given for c/o muscle spasms.   R. Individual out of bed with much encouragement. Safety maintained. Reports result from prn medication for muscle spasms.

## 2014-06-16 NOTE — Progress Notes (Signed)
D:Patient in the dayroom on approach.  Patient states he had an ok day.  Patient states he is looking forward to being discharged tomorrow and going to [place] to a treatment facility.  Patient states he wants to get back to his normal life after treatment.  Patient denies SI/HI and denies AVH. A: Staff to monitor Q 15 mins for safety.  Encouragement and support offered.  Scheduled medications administered per orders. R: Patient remains safe on the unit.  Patient attended group tonight.  Patient visible on the unit. Patient taking administered medications.

## 2014-06-16 NOTE — Plan of Care (Signed)
Problem: Diagnosis: Increased Risk For Suicide Attempt Goal: LTG-Patient Will Report Improved Mood and Deny Suicidal LTG (by discharge) Patient will report improved mood and deny suicidal ideation.  Outcome: Progressing No reported SI so far this shift.

## 2014-06-16 NOTE — Progress Notes (Signed)
[facility] Group Notes:  (Nursing/MHT/Case Management/Adjunct)  Date:  06/16/2014  Time:  10:28 PM  Type of Therapy:  Psychoeducational Skills  Participation Level:  Active  Participation Quality:  Appropriate  Affect:  Appropriate  Cognitive:  Appropriate  Insight:  Appropriate  Engagement in Group:  Engaged  Modes of Intervention:  Discussion  Summary of Progress/Problems: Tonight in Wrap up group Miguel [name] said that his day was good. He was able to set his goals for his recovery once discharged tomorrow.  Madaline Savageiamond N Charlise Giovanetti 06/16/2014, 10:28 PM

## 2014-06-16 NOTE — [facility] Group Notes (Signed)
[facility] LCSW Group Therapy  Emotional Regulation 1:15 - 2: 30 PM        06/16/2014     Type of Therapy:  Group Therapy  Participation Level:  Minimal  Participation Quality:  Minimal  Affect:  Appropriate  Cognitive:   Appropriate  Insight:  Developing/Improving  Engagement in Therapy:  Developing/Improving   Modes of Intervention:  Discussion Exploration Problem-Solving Supportive  Summary of Progress/Problems:  Group topic was emotional regulations.  Patient did not participate in the disussion Miguel [name], Miguel [name] 06/16/2014

## 2014-06-17 MED ORDER — METHOCARBAMOL 500 MG PO TABS: 500.0000 mg | ORAL_TABLET | Freq: Three times a day (TID) | ORAL | Status: DC | PRN

## 2014-06-17 MED ORDER — HYDROXYZINE HCL 25 MG PO TABS: 25.0000 mg | ORAL_TABLET | Freq: Four times a day (QID) | ORAL | Status: DC | PRN

## 2014-06-17 MED ORDER — FLUOXETINE HCL 20 MG PO CAPS: 20.0000 mg | ORAL_CAPSULE | Freq: Every day | ORAL | Status: DC

## 2014-06-17 MED ORDER — CLONIDINE HCL 0.1 MG PO TABS: 0.1000 mg | ORAL_TABLET | Freq: Every day | ORAL | Status: DC

## 2014-06-17 NOTE — Progress Notes (Signed)
  [facility] Discharge [facility] :  Will you be returning to the same living situation after discharge:  Yes,  Patient is returning to his home. At discharge, do you have transportation home?: Yes,  Patient will arrange transportation home. Do you have the ability to pay for your medications: Yes,  Patient can obtain medications.  Release of information consent forms completed and in the chart;  Patient's signature needed at discharge.  Patient to Follow up at: Follow-up Information    Follow up with [facility] [place] On 06/30/2014.   Why:  You are scheduled with Dr. Tenny Crawoss on Wednesday, June 30, 2014 at 9 AM.  Please arrive at 8 AM to complete new patient registration.   Contact information:   [address] [place], [place]   [address]  [phone]   [phone]      Follow up with Florencia Reasonseggy Bynum - [facility] On 07/05/2014.   Why:  You are scheduled with Florencia ReasonsPeggy Bynum on Monday, July 05, 2014 at 1 PM. Please arrive 15 minutes early for appointment   Contact information:   [address] [place], [place]   [address]  [phone]      Patient denies SI/HI: Patient no longer endorsing SI/HI or other thoughts of self harm.   Safety Planning and Suicide Prevention discussed: .Reviewed with all patients during discharge planning group   Have you used any form of tobacco in the last 30 days? (Cigarettes, Smokeless Tobacco, Cigars, and/or Pipes): No  Has patient been referred to the Quitline?: Patient no longer endorsing SI/HI or other thoughts of self harm.   Wynn BankerHodnett, Miguel [name] 06/17/2014, 11:15 AM

## 2014-06-17 NOTE — Progress Notes (Signed)
Patient up and visibile this morning. States he thinks he's leaving today. Denying any problems - physical or emotional. Forwards minimal information. Eye contact brief. Medicated per orders. Discussed with MD, tx team. MD states he will meet with patient regarding follow up tx and whether patient has changed his mind. Support offered. Denies SI/HI/AVH and remains safe. Will await disposition. Lawrence MarseillesFriedman, Annleigh Knueppel Eakes

## 2014-06-17 NOTE — Clinical Social Work Note (Signed)
CSW and MD met with patient who advised due to his roommate moving out, he will not be able to follow through with plans for residential treatment.  Patient stated he will have to get back to work or lose his apartment. [facility] notified patient cancelled plans.

## 2014-06-17 NOTE — [facility] Suicide Risk Assessment (Signed)
[facility]   Demographic Factors:  24 year old man, employed, was living with a roommate, has a son, who lives with the mother.  Total Time spent with patient: 30 minutes  Musculoskeletal: Strength & Muscle Tone: within normal limits Gait & Station: normal Patient leans: N/A  Psychiatric Specialty Exam: Physical Exam  ROS  Blood pressure 142/89, pulse 75, temperature 98.4 F (36.9 C), temperature source Oral, resp. rate 16, height 5' 6.5" (1.689 m), weight 311 lb (141.069 kg).Body mass index is 49.45 kg/(m^2).  General Appearance: improved grooming   Eye Contact::  Good  Speech:  Normal Rate409  Volume:  Normal  Mood:  improved, and today denies feeling depressed   Affect:  Appropriate- less constricted and more reactive affect   Thought Process:  Goal Directed and Linear  Orientation:  Full (Time, Place, and Person)  Thought Content:  denies hallucinations - does state that he frequently hears a baby cry in the evenings as he gets ready for sleep, but it has decreased since admission- not internally preoccupied, no delusions  Suicidal Thoughts:  No- denies any thoughts of hurting self or anyone else   Homicidal Thoughts:  No  Memory:   Recent and remote grossly intact   Judgement:  Other:  improved   Insight:  Present  Psychomotor Activity:  Normal  Concentration:  Good  Recall:  Good  Fund of Knowledge:Good  Language: Good  Akathisia:  Negative  Handed:  Right  AIMS (if indicated):     Assets:  Communication Skills Desire for Improvement Resilience  Sleep:  Number of Hours: 6.75  Cognition: WNL  ADL's: improved     Have you used any form of tobacco in the last 30 days? (Cigarettes, Smokeless Tobacco, Cigars, and/or Pipes): No  Has this patient used any form of tobacco in the last 30 days? (Cigarettes, Smokeless Tobacco, Cigars, and/or Pipes) No  Mental Status Per Nursing Assessment::   On Admission:  NA  Current Mental Status by  Physician: At this time patient is improved- describes mood as improved and affect is more reactive, does smile at times appropriately. No thought disorder, no SI or HI and denies hallucinations/does not appear internally preoccupied. Since the death of his infant daughter, reports he often hears a baby crying at night time, but does not appear psychotic and this symptom more likely related to PTSD . At this time future oriented- initially had been wanting to go to an inpatient rehab, but states he has decided he needs to return back to work, particularly as his roommate left so he is left having to pay rent on his own.States " I need to make the money , I can't lose my place ".  Denies cravings. At this time is presenting calm, in  No acute distress, and does not present with any symptoms of opiate withdrawal .   Loss Factors: Death of  Infant daughter a few years ago-   Historical Factors: history of depression, history of PTSD, history of Opiate Abuse   Risk Reduction Factors:   Responsible for children under 25 years of age, Sense of responsibility to family, Employed and Positive coping skills or problem solving skills  Continued Clinical Symptoms:  As noted, currently much improved, and presenting with improved mood, improved range of affect, and no ongoing significant symptoms of opiate withdrawal at this time.  Cognitive Features That Contribute To Risk:  Closed-mindedness    Suicide Risk:  Mild:  Suicidal ideation of limited  frequency, intensity, duration, and specificity.  There are no identifiable plans, no associated intent, mild dysphoria and related symptoms, good self-control (both objective and subjective assessment), few other risk factors, and identifiable protective factors, including available and accessible social support.  Principal Problem: Severe recurrent major depression without psychotic features Discharge Diagnoses:  Patient Active Problem List   Diagnosis Date  Noted  . Nightmares [F51.5]   . PTSD (post-traumatic stress disorder) [F43.10] 06/11/2014  . Severe recurrent major depression without psychotic features [F33.2] 06/11/2014    Follow-up Information    Follow up with [facility] [place] On 06/30/2014.   Why:  You are scheduled with Dr. Tenny Crawoss on Wednesday, June 30, 2014 at 9 AM.  Please arrive at 8 AM to complete new patient registration.   Contact information:   [address] [place], [place]   [address]  [phone]   [phone]      Follow up with Florencia [facility]ggy [facility] 07[facility].   Why:  You are scheduled with Florencia ReasonsPeggy Bynum on Monday, July 05, 2014 at 1 PM. Please arrive 15 minutes early for appointment   Contact information:   [address] [place], [place]   [address]  [phone]      Plan Of Care/Follow-up recommendations:  Activity:  as tolerated Diet:  Regular Tests:  NA Other:  See below   Is patient on multiple antipsychotic therapies at discharge:  No   Has Patient had three or more failed trials of antipsychotic monotherapy by history:  No  Recommended Plan for Multiple Antipsychotic Therapies: NA   Patient requesting discharge, plan to return home and resume work soon. States he plans to maintain sobriety/abstinence from opiates. He is leaving in good spirits. Follow up as above .     COBOS, FERNANDO 06/17/2014, 12:04 PM

## 2014-06-17 NOTE — Progress Notes (Signed)
Patient packed and ready for discharge. Teaching completed, Rx's given along with samples. All belongings returned. Patient verbalizes understanding. Denies SI./HI and is discharged in stable condition. Lawrence MarseillesFriedman, Charolett Yarrow Eakes

## 2014-06-17 NOTE — [facility] Group Notes (Signed)
[facility] Group Notes:  (Nursing/MHT/Case Management/Adjunct)  Date:  06/17/2014  Time:  0930  Type of Therapy:  Nurse Education  Participation Level:  Active  Participation Quality:  Attentive  Affect:  Appropriate  Cognitive:  Appropriate  Insight:  Good  Engagement in Group:  Engaged  Modes of Intervention:  Discussion, Education and Support  Summary of Progress/Problems: Patients asked to complete assessment on their self care strengths as well as areas for improvement. Challenged to develop goals to assist in improving on areas that are less developed. Patient engaged and forth coming with information and ideas for improvement.   Merian CapronFriedman, Jessicaann Overbaugh [facility] 06/17/2014, 0930

## 2014-06-17 NOTE — Discharge Summary (Signed)
Physician Discharge Summary Note  Patient:  Miguel FreibergDelton Ray [name] is an 24 y.o., male MRN:  [id number] DOB:  02/21/1990 Patient phone:  [phone] (home)  Patient address:   [address] [place] [address],  Total Time spent with patient: 45 minutes  Date of Admission:  06/11/2014 Date of Discharge: 06/17/2014  Reason for Admission:  Depression  Principal Problem: Severe recurrent major depression without psychotic features Discharge Diagnoses: Patient Active Problem List   Diagnosis Date Noted  . Nightmares [F51.5]   . PTSD (post-traumatic stress disorder) [F43.10] 06/11/2014  . Severe recurrent major depression without psychotic features [F33.2] 06/11/2014    Musculoskeletal: Strength & Muscle Tone: within normal limits Gait & Station: normal Patient leans: N/A  Psychiatric Specialty Exam:  SEE SRA Physical Exam  Vitals reviewed. Psychiatric: His mood appears not anxious.    Review of Systems  Psychiatric/Behavioral: Positive for depression. The patient is nervous/anxious.   All other systems reviewed and are negative.   Blood pressure 142/89, pulse 75, temperature 98.4 F (36.9 C), temperature source Oral, resp. rate 16, height 5' 6.5" (1.689 m), weight 141.069 kg (311 lb).Body mass index is 49.45 kg/(m^2).  Have you used any form of tobacco in the last 30 days? (Cigarettes, Smokeless Tobacco, Cigars, and/or Pipes): No  Has this patient used any form of tobacco in the last 30 days? (Cigarettes, Smokeless Tobacco, Cigars, and/or Pipes) N/A  Past Medical History:  Past Medical History  Diagnosis Date  . Hand fracture, right 2008    no surgery required  . Hand fracture, left 2010  . Depression     Past Surgical History  Procedure Laterality Date  . Left hand fracture Left 2010    Dr Magnus IvanBlackman  . Cyst excision     Family History: History reviewed. No pertinent family history. Social History:  History  Alcohol Use No    Comment: He drank this time in  effort to hurt self/ usually non drinker     History  Drug Use No    History   Social History  . Marital Status: Single    Spouse Name: N/A  . Number of Children: N/A  . Years of Education: N/A   Social History Main Topics  . Smoking status: Never Smoker   . Smokeless tobacco: Never Used  . Alcohol Use: No     Comment: He drank this time in effort to hurt self/ usually non drinker  . Drug Use: No  . Sexual Activity: Yes    Birth Control/ Protection: None   Other Topics Concern  . None   Social History Narrative   Risk to Self: Is patient at risk for suicide?: No What has been your use of drugs/alcohol within the last 12 months?: Patient reports drinking excessively prior to suicide attempt.  He advised he normal drinks on special ocassion only Risk to Others:   Prior Inpatient Therapy:   Prior Outpatient Therapy:    Level of Care:  OP  Hospital Course:  Miguel [name] is an 24 y.o. male presenting to ED after roommate called 911 after pt overdosed. Pt reports he has been depressed and drank today and then took 30 tabs of a muscle relaxer. He reports he has been thinking about killing himself for a week now, but had no specific plans. He reports after drinking he took the pills, which did not belong to him. Pt reports he has had SI in the past but never any planning or gestures. At the  time of assessment pt is calm and oriented times 4 with depressed and anxious mood, congruent affect. Logical and coherent speech, impaired judgment. Pt denies HI, SA, and self-injury. He reports he has had thoughts of harming others in the moment when he is very angry. He reports he sees images of people in the dark at times.  Per patient, he developed PTSD in 2013 and has been battling with depression for the last 3 years.     Miguel [name] was admitted for Severe recurrent major depression without psychotic features and crisis management.  He was treated discharged with the medications  listed below under Medication List.  Medical problems were identified and treated as needed.  Home medications were restarted as appropriate.  Improvement was monitored by observation and Miguel [name] daily report of symptom reduction.  Emotional and mental status was monitored by daily self-inventory reports completed by Miguel [name] and clinical staff.         Miguel [name] was evaluated by the treatment team for stability and plans for continued recovery upon discharge.  Miguel [name] motivation was an integral factor for scheduling further treatment.  Employment, transportation, bed availability, health status, family support, and any pending legal issues were also considered during his hospital stay.  He was offered further treatment options upon discharge including but not limited to Residential, Intensive Outpatient, and Outpatient treatment.  Miguel [name] will follow up with the services as listed below under Follow Up Information.     Upon completion of this admission the patient was both mentally and medically stable for discharge denying suicidal/homicidal ideation, auditory/visual/tactile hallucinations, delusional thoughts and paranoia.      Consults:  psychiatry  Significant Diagnostic Studies:  labs: per ED  Discharge Vitals:   Blood pressure 142/89, pulse 75, temperature 98.4 F (36.9 C), temperature source Oral, resp. rate 16, height 5' 6.5" (1.689 m), weight 141.069 kg (311 lb). Body mass index is 49.45 kg/(m^2). Lab Results:   No results found for this or any previous visit (from the past 72 hour(s)).  Physical Findings: AIMS: Facial and Oral Movements Muscles of Facial Expression: None, normal Lips and Perioral Area: None, normal Jaw: None, normal Tongue: None, normal,Extremity Movements Upper (arms, wrists, hands, fingers): None, normal Lower (legs, knees, ankles, toes): None, normal, Trunk Movements Neck, shoulders, hips: None, normal,  Overall Severity Severity of abnormal movements (highest score from questions above): None, normal Incapacitation due to abnormal movements: None, normal Patient's awareness of abnormal movements (rate only patient's report): No Awareness, Dental Status Current problems with teeth and/or dentures?: No Does patient usually wear dentures?: No  CIWA:  CIWA-Ar Total: 1 COWS:  COWS Total Score: 0   See Psychiatric Specialty Exam and Suicide Risk Assessment completed by Attending Physician prior to discharge.  Discharge destination:  Home  Is patient on multiple antipsychotic therapies at discharge:  No   Has Patient had three or more failed trials of antipsychotic monotherapy by history:  No    Recommended Plan for Multiple Antipsychotic Therapies: NA     Medication List    STOP taking these medications        cephALEXin 500 MG capsule  Commonly known as:  KEFLEX     cyclobenzaprine 10 MG tablet  Commonly known as:  FLEXERIL     diclofenac 75 MG EC tablet  Commonly known as:  VOLTAREN     oxyCODONE 5 MG immediate release tablet  Commonly known as:  Oxy IR/ROXICODONE  oxyCODONE-acetaminophen 5-325 MG per tablet  Commonly known as:  ROXICET      TAKE these medications      Indication   cloNIDine 0.1 MG tablet  Commonly known as:  CATAPRES  Take 1 tablet (0.1 mg total) by mouth daily before breakfast.  Start taking on:  06/19/2014   Indication:  Codeine/Morphine-Like Drug Withdrawal Syndrome     FLUoxetine 20 MG capsule  Commonly known as:  PROZAC  Take 1 capsule (20 mg total) by mouth daily.   Indication:  Depression     hydrOXYzine 25 MG tablet  Commonly known as:  ATARAX/VISTARIL  Take 1 tablet (25 mg total) by mouth every 6 (six) hours as needed for anxiety.   Indication:  Anxiety Neurosis     methocarbamol 500 MG tablet  Commonly known as:  ROBAXIN  Take 1 tablet (500 mg total) by mouth every 8 (eight) hours as needed for muscle spasms.   Indication:   Musculoskeletal Pain           Follow-up Information    Follow up with [facility] On 06/30/2014.   Why:  You are scheduled with Dr. Tenny Crawoss on Wednesday, June 30, 2014 at 9 AM.  Please arrive at 8 AM to complete new patient registration.   Contact information:   [address] [place], [place]   [address]  [phone]   [phone]      [facility] Miguel [name] [facility].   Why:  You are scheduled with Miguel ReasonsPeggy [name] on Monday, July 05, 2014 at 1 PM. Please arrive 15 minutes early for appointment   Contact information:   [address] [place], [place]   [address]  [phone]      Follow-up recommendations:  Activity:  as tol, diet as tol  Comments:  1.  Take all your medications as prescribed.              2.  Report any adverse side effects to outpatient provider.                       3.  Patient instructed to not use alcohol or illegal drugs while on prescription medicines.            4.  In the event of worsening symptoms, instructed patient to call 911, the crisis hotline or go to nearest emergency room for evaluation of symptoms.  Total Discharge Time:  40 min  Signed: Velna HatchetSheila May Agustin AGNP-BC 06/17/2014, 2:52 PM   Patient seen, Suicide Assessment Completed.  Disposition Plan Reviewed

## 2014-06-30 ENCOUNTER — Ambulatory Visit (HOSPITAL_COMMUNITY): Payer: Self-pay | Admitting: Psychiatry

## 2014-07-05 ENCOUNTER — Ambulatory Visit (HOSPITAL_COMMUNITY): Payer: Self-pay | Admitting: Psychiatry

## 2014-09-19 ENCOUNTER — Inpatient Hospital Stay (HOSPITAL_COMMUNITY): Payer: Self-pay

## 2014-09-19 ENCOUNTER — Encounter (HOSPITAL_COMMUNITY): Payer: Self-pay | Admitting: Emergency Medicine

## 2014-09-19 ENCOUNTER — Inpatient Hospital Stay (HOSPITAL_COMMUNITY): Payer: No Typology Code available for payment source

## 2014-09-19 ENCOUNTER — Inpatient Hospital Stay (HOSPITAL_COMMUNITY)
Admission: EM | Admit: 2014-09-19 | Discharge: 2014-09-21 | DRG: 917 | Disposition: A | Discharge status: Other Acute Inpatient Hospital | Payer: Self-pay | Attending: Pulmonary Disease | Admitting: Pulmonary Disease

## 2014-09-19 DIAGNOSIS — T1491 Suicide attempt: Secondary | ICD-10-CM

## 2014-09-19 DIAGNOSIS — F4310 Post-traumatic stress disorder, unspecified: Secondary | ICD-10-CM | POA: Diagnosis present

## 2014-09-19 DIAGNOSIS — G9389 Other specified disorders of brain: Secondary | ICD-10-CM

## 2014-09-19 DIAGNOSIS — R45851 Suicidal ideations: Secondary | ICD-10-CM

## 2014-09-19 DIAGNOSIS — E876 Hypokalemia: Secondary | ICD-10-CM | POA: Diagnosis present

## 2014-09-19 DIAGNOSIS — Z6841 Body Mass Index (BMI) 40.0 and over, adult: Secondary | ICD-10-CM

## 2014-09-19 DIAGNOSIS — G9340 Encephalopathy, unspecified: Secondary | ICD-10-CM | POA: Diagnosis present

## 2014-09-19 DIAGNOSIS — T450X2A Poisoning by antiallergic and antiemetic drugs, intentional self-harm, initial encounter: Principal | ICD-10-CM | POA: Diagnosis present

## 2014-09-19 DIAGNOSIS — F329 Major depressive disorder, single episode, unspecified: Secondary | ICD-10-CM

## 2014-09-19 DIAGNOSIS — F339 Major depressive disorder, recurrent, unspecified: Secondary | ICD-10-CM | POA: Diagnosis present

## 2014-09-19 DIAGNOSIS — T1491XA Suicide attempt, initial encounter: Secondary | ICD-10-CM | POA: Diagnosis present

## 2014-09-19 DIAGNOSIS — G8929 Other chronic pain: Secondary | ICD-10-CM | POA: Diagnosis present

## 2014-09-19 DIAGNOSIS — E669 Obesity, unspecified: Secondary | ICD-10-CM | POA: Diagnosis present

## 2014-09-19 DIAGNOSIS — I10 Essential (primary) hypertension: Secondary | ICD-10-CM | POA: Diagnosis present

## 2014-09-19 DIAGNOSIS — Z79899 Other long term (current) drug therapy: Secondary | ICD-10-CM

## 2014-09-19 DIAGNOSIS — F209 Schizophrenia, unspecified: Secondary | ICD-10-CM | POA: Diagnosis present

## 2014-09-19 DIAGNOSIS — R339 Retention of urine, unspecified: Secondary | ICD-10-CM | POA: Diagnosis present

## 2014-09-19 DIAGNOSIS — Z885 Allergy status to narcotic agent status: Secondary | ICD-10-CM

## 2014-09-19 HISTORY — DX: Bipolar disorder, unspecified: F31.9

## 2014-09-19 HISTORY — DX: Schizophrenia, unspecified: F20.9

## 2014-09-19 LAB — ACETAMINOPHEN LEVEL

## 2014-09-19 LAB — CBG MONITORING, ED: GLUCOSE-CAPILLARY: 81 mg/dL (ref 65–99)

## 2014-09-19 LAB — COMPREHENSIVE METABOLIC PANEL
ALK PHOS: 94 U/L (ref 38–126)
ALT: 78 U/L — ABNORMAL HIGH (ref 17–63)
ANION GAP: 11 (ref 5–15)
AST: 52 U/L — ABNORMAL HIGH (ref 15–41)
Albumin: 4.6 g/dL (ref 3.5–5.0)
BILIRUBIN TOTAL: 0.6 mg/dL (ref 0.3–1.2)
BUN: 7 mg/dL (ref 6–20)
CALCIUM: 9.5 mg/dL (ref 8.9–10.3)
CO2: 23 mmol/L (ref 22–32)
Chloride: 107 mmol/L (ref 101–111)
Creatinine, Ser: 0.79 mg/dL (ref 0.61–1.24)
GFR calc Af Amer: >60 mL/min (ref >60)
Glucose, Bld: 88 mg/dL (ref 65–99)
POTASSIUM: 3.2 mmol/L — AB (ref 3.5–5.1)
Sodium: 141 mmol/L (ref 135–145)
TOTAL PROTEIN: 8 g/dL (ref 6.5–8.1)

## 2014-09-19 LAB — CBC WITH DIFFERENTIAL/PLATELET
Basophils Absolute: 0 K/uL (ref 0.0–0.1)
Basophils Relative: 0 % (ref 0–1)
Eosinophils Absolute: 0 K/uL (ref 0.0–0.7)
Eosinophils Relative: 0 % (ref 0–5)
HCT: 47 % (ref 39.0–52.0)
Hemoglobin: 16.3 g/dL (ref 13.0–17.0)
Lymphocytes Relative: 8 % — ABNORMAL LOW (ref 12–46)
Lymphs Abs: 1.5 K/uL (ref 0.7–4.0)
MCH: 29.9 pg (ref 26.0–34.0)
MCHC: 34.7 g/dL (ref 30.0–36.0)
MCV: 86.2 fL (ref 78.0–100.0)
Monocytes Absolute: 0.7 K/uL (ref 0.1–1.0)
Monocytes Relative: 4 % (ref 3–12)
Neutro Abs: 15.4 K/uL — ABNORMAL HIGH (ref 1.7–7.7)
Neutrophils Relative %: 88 % — ABNORMAL HIGH (ref 43–77)
Platelets: 258 K/uL (ref 150–400)
RBC: 5.45 MIL/uL (ref 4.22–5.81)
RDW: 12 % (ref 11.5–15.5)
WBC: 17.6 K/uL — ABNORMAL HIGH (ref 4.0–10.5)

## 2014-09-19 LAB — CBC
HCT: 48.9 % (ref 39.0–52.0)
Hemoglobin: 16.9 g/dL (ref 13.0–17.0)
MCH: 29.9 pg (ref 26.0–34.0)
MCHC: 34.6 g/dL (ref 30.0–36.0)
MCV: 86.5 fL (ref 78.0–100.0)
PLATELETS: 269 10*3/uL (ref 150–400)
RBC: 5.65 MIL/uL (ref 4.22–5.81)
RDW: 11.9 % (ref 11.5–15.5)
WBC: 13.5 10*3/uL — AB (ref 4.0–10.5)

## 2014-09-19 LAB — URINALYSIS, ROUTINE W REFLEX MICROSCOPIC
BILIRUBIN URINE: NEGATIVE
Glucose, UA: NEGATIVE mg/dL
Hgb urine dipstick: NEGATIVE
KETONES UR: NEGATIVE mg/dL
LEUKOCYTES UA: NEGATIVE
NITRITE: NEGATIVE
PH: 6 (ref 5.0–8.0)
PROTEIN: NEGATIVE mg/dL
Specific Gravity, Urine: 1.009 (ref 1.005–1.030)
UROBILINOGEN UA: 0.2 mg/dL (ref 0.0–1.0)

## 2014-09-19 LAB — COMPREHENSIVE METABOLIC PANEL WITH GFR
ALT: 74 U/L — ABNORMAL HIGH (ref 17–63)
AST: 50 U/L — ABNORMAL HIGH (ref 15–41)
Albumin: 4.6 g/dL (ref 3.5–5.0)
Alkaline Phosphatase: 93 U/L (ref 38–126)
Anion gap: 11 (ref 5–15)
BUN: 5 mg/dL — ABNORMAL LOW (ref 6–20)
CO2: 24 mmol/L (ref 22–32)
Calcium: 9.1 mg/dL (ref 8.9–10.3)
Chloride: 107 mmol/L (ref 101–111)
Creatinine, Ser: 0.73 mg/dL (ref 0.61–1.24)
GFR calc Af Amer: >60 mL/min (ref >60)
GFR calc non Af Amer: >60 mL/min (ref >60)
Glucose, Bld: 89 mg/dL (ref 65–99)
Potassium: 3.6 mmol/L (ref 3.5–5.1)
Sodium: 142 mmol/L (ref 135–145)
Total Bilirubin: 0.5 mg/dL (ref 0.3–1.2)
Total Protein: 7.5 g/dL (ref 6.5–8.1)

## 2014-09-19 LAB — HEPATIC FUNCTION PANEL
ALK PHOS: 93 U/L (ref 38–126)
ALT: 73 U/L — AB (ref 17–63)
AST: 51 U/L — ABNORMAL HIGH (ref 15–41)
Albumin: 4.5 g/dL (ref 3.5–5.0)
BILIRUBIN DIRECT: 0.2 mg/dL (ref 0.1–0.5)
BILIRUBIN INDIRECT: 0.6 mg/dL (ref 0.3–0.9)
BILIRUBIN TOTAL: 0.8 mg/dL (ref 0.3–1.2)
Total Protein: 7.6 g/dL (ref 6.5–8.1)

## 2014-09-19 LAB — SALICYLATE LEVEL: Salicylate Lvl: <4 mg/dL (ref 2.8–30.0)

## 2014-09-19 LAB — TROPONIN I
Troponin I: <0.03 ng/mL (ref <0.031)
Troponin I: <0.03 ng/mL (ref <0.031)
Troponin I: <0.03 ng/mL (ref <0.031)

## 2014-09-19 LAB — RAPID URINE DRUG SCREEN, HOSP PERFORMED
Amphetamines: NOT DETECTED
BARBITURATES: NOT DETECTED
BENZODIAZEPINES: POSITIVE — AB
Cocaine: POSITIVE — AB
Opiates: NOT DETECTED
Tetrahydrocannabinol: NOT DETECTED

## 2014-09-19 LAB — ETHANOL

## 2014-09-19 LAB — MAGNESIUM: MAGNESIUM: 1.4 mg/dL — AB (ref 1.7–2.4)

## 2014-09-19 LAB — CK: Total CK: 108 U/L (ref 49–397)

## 2014-09-19 LAB — MRSA PCR SCREENING: MRSA by PCR: NEGATIVE

## 2014-09-19 LAB — PHOSPHORUS: PHOSPHORUS: 3.4 mg/dL (ref 2.5–4.6)

## 2014-09-19 MED ORDER — LORAZEPAM 2 MG/ML IJ SOLN
2.0000 mg | Freq: Once | INTRAMUSCULAR | Status: AC
  Administered 2014-09-19: 2 mg via INTRAVENOUS

## 2014-09-19 MED ORDER — ASPIRIN 81 MG PO CHEW
324.0000 mg | CHEWABLE_TABLET | ORAL | Status: AC
  Administered 2014-09-19: 324 mg via ORAL
  Filled 2014-09-19: qty 4

## 2014-09-19 MED ORDER — ASPIRIN 300 MG RE SUPP: 300.0000 mg | RECTAL | Status: AC

## 2014-09-19 MED ORDER — HEPARIN SODIUM (PORCINE) 5000 UNIT/ML IJ SOLN
5000.0000 [IU] | Freq: Three times a day (TID) | INTRAMUSCULAR | Status: DC
  Administered 2014-09-19 – 2014-09-21 (×6): 5000 [IU] via SUBCUTANEOUS
  Filled 2014-09-19 (×9): qty 1

## 2014-09-19 MED ORDER — HYDRALAZINE HCL 20 MG/ML IJ SOLN
5.0000 mg | Freq: Four times a day (QID) | INTRAMUSCULAR | Status: DC | PRN
  Administered 2014-09-20: 5 mg via INTRAVENOUS
  Filled 2014-09-19 (×2): qty 1

## 2014-09-19 MED ORDER — SODIUM CHLORIDE 0.9 % IV SOLN: 250.0000 mL | INTRAVENOUS | Status: DC | PRN

## 2014-09-19 MED ORDER — FOLIC ACID 1 MG PO TABS
1.0000 mg | ORAL_TABLET | Freq: Every day | ORAL | Status: DC
  Administered 2014-09-19 – 2014-09-21 (×3): 1 mg via ORAL
  Filled 2014-09-19 (×3): qty 1

## 2014-09-19 MED ORDER — SODIUM CHLORIDE 0.9 % IV SOLN
INTRAVENOUS | Status: DC
  Administered 2014-09-19: 125 mL via INTRAVENOUS
  Administered 2014-09-20 – 2014-09-21 (×5): via INTRAVENOUS

## 2014-09-19 MED ORDER — LORAZEPAM 2 MG/ML IJ SOLN
2.0000 mg | Freq: Four times a day (QID) | INTRAMUSCULAR | Status: DC
  Administered 2014-09-19: 2 mg via INTRAVENOUS
  Administered 2014-09-19: 1 mg via INTRAVENOUS
  Administered 2014-09-19 – 2014-09-21 (×4): 2 mg via INTRAVENOUS
  Filled 2014-09-19 (×6): qty 1

## 2014-09-19 MED ORDER — LORAZEPAM 2 MG/ML IJ SOLN
0.5000 mg | INTRAMUSCULAR | Status: DC | PRN
  Administered 2014-09-20 – 2014-09-21 (×4): 1 mg via INTRAVENOUS
  Filled 2014-09-19 (×4): qty 1

## 2014-09-19 MED ORDER — VITAMIN B-1 100 MG PO TABS
100.0000 mg | ORAL_TABLET | Freq: Every day | ORAL | Status: DC
  Administered 2014-09-19 – 2014-09-21 (×2): 100 mg via ORAL
  Filled 2014-09-19 (×2): qty 1

## 2014-09-19 MED ORDER — SODIUM CHLORIDE 0.9 % IV BOLUS (SEPSIS)
1000.0000 mL | Freq: Once | INTRAVENOUS | Status: AC
  Administered 2014-09-19: 1000 mL via INTRAVENOUS

## 2014-09-19 MED ORDER — LORAZEPAM 2 MG/ML IJ SOLN
INTRAMUSCULAR | Status: AC
  Filled 2014-09-19: qty 1

## 2014-09-19 NOTE — Progress Notes (Signed)
Chaplain referral by Staff. Mr Speir was very confused and was not able to focus on our conversation. He could not relate much about his suicide attempt other than it was by overdose and that there was a depressing situation with his roommate. He felt he had no reason to live. Most of his other statements were slurred and rambling with no discernable context.  Mr Bloyd acknowledged his connection to spiritual matters but was too confused in this speech to articulate his connection with such matters.  Chaplain and staff recommend a visit by a Chaplain after Mr Ferns is in rested more.  Benjie Karvonen. Labrandon Knoch, DMin Chaplain

## 2014-09-19 NOTE — Consult Note (Addendum)
[facility] Face-to-Face Psychiatry Consult   Reason for Consult:  Suicide Attempt  Referring Physician:  Dr. Vaughan Browner  Patient Identification: Miguel [name] MRN:  [id number] Principal Diagnosis: Suicide attempt by overdose  Diagnosis:   Patient Active Problem List   Diagnosis Date Noted  . Suicide attempt [T14.91] 09/19/2014  . Nightmares [F51.5]   . PTSD (post-traumatic stress disorder) [F43.10] 06/11/2014  . Severe recurrent major depression without psychotic features [F33.2] 06/11/2014    Total Time spent with patient: 30 minutes  Subjective:   Miguel [name] is a 24 y.o. male patient admitted with  Suicide attempt by overdose   HPI:   Patient is a 24 year old man. He is currently unable to provide much history as he  Remains confused, delirious at this time . He is  , however,  Able to provide some valuable  Information as below .  He acknowledges he attempted suicide by overdosing . States it was " spur of the moment", not planned out, and states he  Has been  feeling " tired of life". He is unable to tell me what medication or how many tablets he took, but as per chart notes, overdosed on several 100 mgr tablets of  Benadryl. He presented to ED with changes in mental status, confusion following overdose . Patient endorses depression, sadness, suicidal ideations.  He states he was taking Prozac in the past , but cannot say if he had stopped it or if it helped him.   Of note, he denies alcohol abuse , denies drug abuse, but does mention opiate medications, but is unable to specify if he has been taking this type of medication or  When he last took them  . His admission UDS is positive for BZDs and for  Cocaine, but negative for opiates . Admission BAL is negative . 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  In reviewing chart, patient had a psychiatric admission at [facility] in May /2016, for depression, suicide attempt by overdosing on a muscle  relaxant, and a history of depression / panic attacks, following death of  An infant daughter  In 2013. He was diagnosed with MDD. Apparently was also given clonidine at the time to address opiate withdrawal symptoms. He was started on Prozac and was discharged much improved .     HPI Elements:   Acute confusion/mental status changes related to Suicide attempt , severe, by overdosing on  An Antihistamine. Prior history of Mood Disorder, suicide attempts .   Past Medical History:  Past Medical History  Diagnosis Date  . Hand fracture, right 2008    no surgery required  . Hand fracture, left 2010  . Depression   . Schizophrenia   . Bipolar 1 disorder     Past Surgical History  Procedure Laterality Date  . Left hand fracture Left 2010    Dr Ninfa Linden  . Cyst excision     Family History: No family history on file. Social History:  History  Alcohol Use No    Comment: He drank this time in effort to hurt self/ usually non drinker     History  Drug Use No    Social History   Social History  . Marital Status: Single    Spouse Name: N/A  . Number of Children: N/A  . Years of Education: N/A   Social History Main Topics  . Smoking status: Never Smoker   . Smokeless tobacco: Never Used  .  Alcohol Use: No     Comment: He drank this time in effort to hurt self/ usually non drinker  . Drug Use: No  . Sexual Activity: Yes    Birth Control/ Protection: None   Other Topics Concern  . None   Social History Narrative   Additional Social History:   Allergies:   Allergies  Allergen Reactions  . Tramadol Hives  . Vicodin [Hydrocodone-Acetaminophen] Nausea Only    Labs:  Results for orders placed or performed during the hospital encounter of 09/19/14 (from the past 48 hour(s))  Comprehensive metabolic panel     Status: Abnormal   Collection Time: 09/19/14  6:40 AM  Result Value Ref Range   Sodium 141 135 - 145 mmol/L   Potassium 3.2 (L) 3.5 - 5.1 mmol/L   Chloride 107 101  - 111 mmol/L   CO2 23 22 - 32 mmol/L   Glucose, Bld 88 65 - 99 mg/dL   BUN 7 6 - 20 mg/dL   Creatinine, Ser 0.79 0.61 - 1.24 mg/dL   Calcium 9.5 8.9 - 10.3 mg/dL   Total Protein 8.0 6.5 - 8.1 g/dL   Albumin 4.6 3.5 - 5.0 g/dL   AST 52 (H) 15 - 41 U/L   ALT 78 (H) 17 - 63 U/L   Alkaline Phosphatase 94 38 - 126 U/L   Total Bilirubin 0.6 0.3 - 1.2 mg/dL   GFR calc non Af Amer >60 >60 mL/min   GFR calc Af Amer >60 >60 mL/min    Comment: (NOTE) The eGFR has been calculated using the CKD EPI equation. This calculation has not been validated in all clinical situations. eGFR's persistently <60 mL/min signify possible Chronic Kidney Disease.    Anion gap 11 5 - 15  Ethanol (ETOH)     Status: None   Collection Time: 09/19/14  6:40 AM  Result Value Ref Range   Alcohol, Ethyl (B) <5 <5 mg/dL    Comment:        LOWEST DETECTABLE LIMIT FOR SERUM ALCOHOL IS 5 mg/dL FOR MEDICAL PURPOSES ONLY   Salicylate level     Status: None   Collection Time: 09/19/14  6:40 AM  Result Value Ref Range   Salicylate Lvl <2.8 2.8 - 30.0 mg/dL  Acetaminophen level     Status: Abnormal   Collection Time: 09/19/14  6:40 AM  Result Value Ref Range   Acetaminophen (Tylenol), Serum <10 (L) 10 - 30 ug/mL    Comment:        THERAPEUTIC CONCENTRATIONS VARY SIGNIFICANTLY. A RANGE OF 10-30 ug/mL MAY BE AN EFFECTIVE CONCENTRATION FOR MANY PATIENTS. HOWEVER, SOME ARE BEST TREATED AT CONCENTRATIONS OUTSIDE THIS RANGE. ACETAMINOPHEN CONCENTRATIONS >150 ug/mL AT 4 HOURS AFTER INGESTION AND >50 ug/mL AT 12 HOURS AFTER INGESTION ARE OFTEN ASSOCIATED WITH TOXIC REACTIONS.   CBC     Status: Abnormal   Collection Time: 09/19/14  6:40 AM  Result Value Ref Range   WBC 13.5 (H) 4.0 - 10.5 K/uL   RBC 5.65 4.22 - 5.81 MIL/uL   Hemoglobin 16.9 13.0 - 17.0 g/dL   HCT 48.9 39.0 - 52.0 %   MCV 86.5 78.0 - 100.0 fL   MCH 29.9 26.0 - 34.0 pg   MCHC 34.6 30.0 - 36.0 g/dL   RDW 11.9 11.5 - 15.5 %   Platelets 269 150 -  400 K/uL  Urine rapid drug screen (hosp performed) (Not at [facility])     Status: Abnormal   Collection Time: 09/19/14  7:18 AM  Result Value Ref Range   Opiates NONE DETECTED NONE DETECTED   Cocaine POSITIVE (A) NONE DETECTED   Benzodiazepines POSITIVE (A) NONE DETECTED   Amphetamines NONE DETECTED NONE DETECTED   Tetrahydrocannabinol NONE DETECTED NONE DETECTED   Barbiturates NONE DETECTED NONE DETECTED    Comment:        DRUG SCREEN FOR MEDICAL PURPOSES ONLY.  IF CONFIRMATION IS NEEDED FOR ANY PURPOSE, NOTIFY LAB WITHIN 5 DAYS.        LOWEST DETECTABLE LIMITS FOR URINE DRUG SCREEN Drug Class       Cutoff (ng/mL) Amphetamine      1000 Barbiturate      200 Benzodiazepine   244 Tricyclics       628 Opiates          300 Cocaine          300 THC              50   Urinalysis, Routine w reflex microscopic (not at [facility])     Status: None   Collection Time: 09/19/14  7:19 AM  Result Value Ref Range   Color, Urine YELLOW YELLOW   APPearance CLEAR CLEAR   Specific Gravity, Urine 1.009 1.005 - 1.030   pH 6.0 5.0 - 8.0   Glucose, UA NEGATIVE NEGATIVE mg/dL   Hgb urine dipstick NEGATIVE NEGATIVE   Bilirubin Urine NEGATIVE NEGATIVE   Ketones, ur NEGATIVE NEGATIVE mg/dL   Protein, ur NEGATIVE NEGATIVE mg/dL   Urobilinogen, UA 0.2 0.0 - 1.0 mg/dL   Nitrite NEGATIVE NEGATIVE   Leukocytes, UA NEGATIVE NEGATIVE    Comment: MICROSCOPIC NOT DONE ON URINES WITH NEGATIVE PROTEIN, BLOOD, LEUKOCYTES, NITRITE, OR GLUCOSE <1000 mg/dL.  CBG monitoring, ED     Status: None   Collection Time: 09/19/14  7:48 AM  Result Value Ref Range   Glucose-Capillary 81 65 - 99 mg/dL  MRSA PCR Screening     Status: None   Collection Time: 09/19/14  9:46 AM  Result Value Ref Range   MRSA by PCR NEGATIVE NEGATIVE    Comment:        The GeneXpert MRSA Assay (FDA approved for NASAL specimens only), is one component of a comprehensive MRSA colonization surveillance program. It is not intended to diagnose  MRSA infection nor to guide or monitor treatment for MRSA infections.   Comprehensive metabolic panel     Status: Abnormal   Collection Time: 09/19/14 10:11 AM  Result Value Ref Range   Sodium 142 135 - 145 mmol/L   Potassium 3.6 3.5 - 5.1 mmol/L   Chloride 107 101 - 111 mmol/L   CO2 24 22 - 32 mmol/L   Glucose, Bld 89 65 - 99 mg/dL   BUN 5 (L) 6 - 20 mg/dL   Creatinine, Ser 0.73 0.61 - 1.24 mg/dL   Calcium 9.1 8.9 - 10.3 mg/dL   Total Protein 7.5 6.5 - 8.1 g/dL   Albumin 4.6 3.5 - 5.0 g/dL   AST 50 (H) 15 - 41 U/L   ALT 74 (H) 17 - 63 U/L   Alkaline Phosphatase 93 38 - 126 U/L   Total Bilirubin 0.5 0.3 - 1.2 mg/dL   GFR calc non Af Amer >60 >60 mL/min   GFR calc Af Amer >60 >60 mL/min    Comment: (NOTE) The eGFR has been calculated using the CKD EPI equation. This calculation has not been validated in all clinical situations. eGFR's persistently <60 mL/min signify possible Chronic Kidney  Disease.    Anion gap 11 5 - 15  Magnesium     Status: Abnormal   Collection Time: 09/19/14 10:11 AM  Result Value Ref Range   Magnesium 1.4 (L) 1.7 - 2.4 mg/dL  Phosphorus     Status: None   Collection Time: 09/19/14 10:11 AM  Result Value Ref Range   Phosphorus 3.4 2.5 - 4.6 mg/dL  Troponin I     Status: None   Collection Time: 09/19/14 10:11 AM  Result Value Ref Range   Troponin I <0.03 <0.031 ng/mL    Comment:        NO INDICATION OF MYOCARDIAL INJURY.   CBC WITH DIFFERENTIAL     Status: Abnormal   Collection Time: 09/19/14 10:11 AM  Result Value Ref Range   WBC 17.6 (H) 4.0 - 10.5 K/uL   RBC 5.45 4.22 - 5.81 MIL/uL   Hemoglobin 16.3 13.0 - 17.0 g/dL   HCT 47.0 39.0 - 52.0 %   MCV 86.2 78.0 - 100.0 fL   MCH 29.9 26.0 - 34.0 pg   MCHC 34.7 30.0 - 36.0 g/dL   RDW 12.0 11.5 - 15.5 %   Platelets 258 150 - 400 K/uL   Neutrophils Relative % 88 (H) 43 - 77 %   Neutro Abs 15.4 (H) 1.7 - 7.7 K/uL   Lymphocytes Relative 8 (L) 12 - 46 %   Lymphs Abs 1.5 0.7 - 4.0 K/uL    Monocytes Relative 4 3 - 12 %   Monocytes Absolute 0.7 0.1 - 1.0 K/uL   Eosinophils Relative 0 0 - 5 %   Eosinophils Absolute 0.0 0.0 - 0.7 K/uL   Basophils Relative 0 0 - 1 %   Basophils Absolute 0.0 0.0 - 0.1 K/uL    Vitals: Blood pressure 147/110, pulse 116, temperature 98.5 F (36.9 C), temperature source Oral, resp. rate 25, height $RemoveBe'5\' 8"'ZTJnnLhwX$  (1.727 m), weight 329 lb 9.4 oz (149.5 kg), SpO2 98 %.  Risk to Self: Is patient at risk for suicide?: Yes Risk to Others:   Prior Inpatient Therapy:   Prior Outpatient Therapy:    Current Facility-Administered Medications  Medication Dose Route Frequency Provider Last Rate Last Dose  . 0.9 %  sodium chloride infusion  250 mL Intravenous PRN Praveen Mannam, MD      . aspirin chewable tablet 324 mg  324 mg Oral NOW Praveen Mannam, MD       Or  . aspirin suppository 300 mg  300 mg Rectal NOW Praveen Mannam, MD      . heparin injection 5,000 Units  5,000 Units Subcutaneous 3 times per day Praveen Mannam, MD      . hydrALAZINE (APRESOLINE) injection 5 mg  5 mg Intravenous Q6H PRN Praveen Mannam, MD      . LORazepam (ATIVAN) injection 0.5-1 mg  0.5-1 mg Intravenous Q4H PRN Marshell Garfinkel, MD        Musculoskeletal: Strength & Muscle Tone: some tremulousness noted  Gait & Station: gait not examined, patient in bed  Patient leans: N/A  Psychiatric Specialty Exam: Physical Exam  ROS  Blood pressure 147/110, pulse 116, temperature 98.5 F (36.9 C), temperature source Oral, resp. rate 25, height $RemoveBe'5\' 8"'innDTeiwZ$  (1.727 m), weight 329 lb 9.4 oz (149.5 kg), SpO2 98 %.Body mass index is 50.13 kg/(m^2).  General Appearance: Fairly Groomed- in hospital garb   Eye Contact::  Good  Speech:  Slow  Volume:  Decreased  Mood:  Anxious and Depressed  Affect:  Constricted  and anxious   Thought Process:  Disorganized  Orientation:  Other:  oriented to person, partially to time - August , disoriented to place   Thought Content:  denies hallucinations, but looks around  the room at times as if internally preoccupied   Suicidal Thoughts:  Yes.  without intent/plan- currently does not endorse ongoing or active SI  Homicidal Thoughts:  No  Memory:  recent and remote poor   Judgement:  Impaired  Insight:  Lacking  Psychomotor Activity:  Restlessness  Concentration:  Fair  Recall:  [employer] of Knowledge:Fair  Language: Fair  Akathisia:  Negative  Handed:  Right  AIMS (if indicated):     Assets:  Desire for Improvement Resilience  ADL's:  Impaired  Cognition:  Confused   Sleep:      Medical Decision Making: Review of Psycho-Social Stressors (1), Review or order clinical lab tests (1), Established Problem, Worsening (2) and Review of Medication Regimen & Side Effects (2)  Treatment Plan Summary: Recommendations:  ( Discussed with Dr. Vaughan Browner as below)   1. At this time patient presents, in addition to his underlying depression, confused and agitated , likely as a result of his overdose on antihistamines. However, based on (+) BZD on UDS,  Agitation, restlessness, tremulousness, confusion, and elevated vitals , I would consider possible BZD withdrawal, delirium . Consider standing Ativan Detox Protocol, Thiamine, Folate .   1. At this time patient meets criteria for inpatient psychiatric admission once medically cleared . 3. Agree with need for one to one observation/sitter . 4. At this time would not start antidepressant or other psychiatric medication, until further stabilized/ confusion resolved  5. Psychiatry consultant will follow with you.  Plan:  Recommend psychiatric Inpatient admission when medically cleared.   COBOS, [place] 09/19/2014 11:54 AM

## 2014-09-19 NOTE — ED Notes (Signed)
Patient has grey shorts and blue underwear in a patient belonging bag at bedside.

## 2014-09-19 NOTE — ED Notes (Signed)
Bed: RESA Expected date:  Expected time:  Means of arrival:  Comments: EMS 24yo M 250-300 Benadryl tabs, hallucinations

## 2014-09-19 NOTE — H&P (Signed)
PULMONARY / CRITICAL CARE MEDICINE   Name: Miguel [name] MRN: [id number] DOB: 05-20-1990    ADMISSION DATE:  09/19/2014 CONSULTATION DATE:  09/19/14  REFERRING MD :  ED  CHIEF COMPLAINT:  Benadryl overdose, altered mental status.  INITIAL PRESENTATION: 24 Y/O male with H/O severe depression, PTSD multiple suicidal attempts in the past. Came to the ED with altered mental status after having taken several 100 mg of Benadryl. This was a suicide attempt. It is unclear when he actually took the pills whether today morning or yesterday afternoon. In the ED he was noted to have hallucinations, nystagmus and altered mental status. There is report of vomiting on route to the ED. He was given 2 L fluid bolus and 2 mg Ativan. At present time he is hematologically stable and able to protect his elevates. There is no acute need for intubation. Urine toxicology is positive for benzos and cocaine. EKG reviewed. QTC is not prolonged.  STUDIES:  EKG-   SIGNIFICANT EVENTS: 8/21- Admitted for Benadryl overdose, suicide attempt   HISTORY OF PRESENT ILLNESS:    PAST MEDICAL HISTORY :   has a past medical history of Hand fracture, right (2008); Hand fracture, left (2010); Depression; Schizophrenia; and Bipolar 1 disorder.  has past surgical history that includes left hand fracture (Left, 2010) and Cyst excision. Prior to Admission medications   Medication Sig Start Date End Date Taking? Authorizing Provider  hydrOXYzine (ATARAX/VISTARIL) 25 MG tablet Take 1 tablet (25 mg total) by mouth every 6 (six) hours as needed for anxiety. 06/17/14  Yes Adonis Brook, NP  methocarbamol (ROBAXIN) 500 MG tablet Take 1 tablet (500 mg total) by mouth every 8 (eight) hours as needed for muscle spasms. 06/17/14  Yes Adonis Brook, NP  cloNIDine (CATAPRES) 0.1 MG tablet Take 1 tablet (0.1 mg total) by mouth daily before breakfast. 06/19/14   Adonis Brook, NP  FLUoxetine (PROZAC) 20 MG capsule Take 1 capsule (20 mg total)  by mouth daily. 06/17/14   Adonis Brook, NP   Allergies  Allergen Reactions  . Tramadol Hives  . Vicodin [Hydrocodone-Acetaminophen] Nausea Only    FAMILY HISTORY:  indicated that his mother is alive. He indicated that his sister is alive.  SOCIAL HISTORY:  reports that he has never smoked. He has never used smokeless tobacco. He reports that he does not drink alcohol or use illicit drugs.  REVIEW OF SYSTEMS:  Unable to obtain as patient is confused.  SUBJECTIVE:   VITAL SIGNS: Temp:  [98.2 F (36.8 C)-99.4 F (37.4 C)] 99.4 F (37.4 C) (08/21 0728) Pulse Rate:  [108-126] 108 (08/21 0728) Resp:  [16-24] 24 (08/21 0728) BP: (151-164)/(103-104) 151/104 mmHg (08/21 0728) SpO2:  [96 %-97 %] 96 % (08/21 0728) HEMODYNAMICS:   VENTILATOR SETTINGS:   INTAKE / OUTPUT:  Intake/Output Summary (Last 24 hours) at 09/19/14 0803 Last data filed at 09/19/14 0757  Gross per 24 hour  Intake      0 ml  Output   1400 ml  Net  -1400 ml    PHYSICAL EXAMINATION: General:  Awake, No distress, confused Neuro:  No gross focal deficits HEENT: Moist mucus membranes,  Cardiovascular:  RRR, No MRG Lungs:  Clear anteriroly Abdomen:  Obese, soft, + BS Musculoskeletal:  No edema Skin:  Intact.  LABS:  CBC  Recent Labs Lab 09/19/14 0640  WBC 13.5*  HGB 16.9  HCT 48.9  PLT 269   Coag's No results for input(s): APTT, INR in the last 168 hours. BMET  Recent Labs Lab 09/19/14 0640  NA 141  K 3.2*  CL 107  CO2 23  BUN 7  CREATININE 0.79  GLUCOSE 88   Electrolytes  Recent Labs Lab 09/19/14 0640  CALCIUM 9.5   Sepsis Markers No results for input(s): LATICACIDVEN, PROCALCITON, O2SATVEN in the last 168 hours. ABG No results for input(s): PHART, PCO2ART, PO2ART in the last 168 hours. Liver Enzymes  Recent Labs Lab 09/19/14 0640  AST 52*  ALT 78*  ALKPHOS 94  BILITOT 0.6  ALBUMIN 4.6   Cardiac Enzymes No results for input(s): TROPONINI, PROBNP in the last 168  hours. Glucose  Recent Labs Lab 09/19/14 0748  GLUCAP 81    Imaging No results found.   ASSESSMENT / PLAN:  PULMONARY A:Pt able to protect airway. Good sats on room air. P:   Will monitor in ICU Chest x ray to assess for aspiration.   CARDIOVASCULAR A: Stable. Slightly hypertensive P:  Use hydralazine when necessary for hypertension. Hold outpatient clonidine. EKGs to monitor QRS. IV fluid hydration. Normal saline at 125 mL per hour  RENAL A: Had urinary retention.  1.5 L of urine after Foley was inserted P:   Continue Foley's. Monitor I/O  GASTROINTESTINAL A:  No issues P:   Keep NPO  HEMATOLOGIC A: Stable P:    INFECTIOUS A:  No evidence of infection P:   Monitor off antibiotics  NEUROLOGIC A: Altered due to benadryl ingestion.  P:    Monitor for seizures Ativan PRN. If in status epilepticus then he will need intubation and barbiturates.  PSYCHIATRY A: Severe depression with multiple suicide attempts. P: Psychiatry consult. 1:1 sitter.  FAMILY  - Updates: Family not available - Inter-disciplinary family meet or Palliative Care meeting due by: 8/28  TODAY'S SUMMARY: Admitted for benadryl overdose. Hemodynamically stable. Able to protect airway. Monitor in ICU with IVF hydration, Monitor qTC.   Chilton Greathouse MD [facility] Pager [phone] If no answer or after 3pm call: [phone] 09/19/2014, 8:23 AM

## 2014-09-19 NOTE — ED Notes (Addendum)
Report received, assessment completed.   General: Laying in bed, alert, startles easily, dry mucous membranes CV: Sinus Tach 102-110, BP 152/101, no pedal edema, pulses intact and equally Pulm: Satting well on room air, lungs CTA, resps non-labored GI: Soft nontender GU: 16 fr Foley inserted, 1500 ml clear yellow urine expelled.  Ext: Movement x 4 extremities, warm and well perfused.  Neuro: Alert, +nystagmus, pupils 5mm equal round and reactive, does not follow commands, speech incomprehensible, does not respond to orientation questions.   PIV: x 2 Intensive Care MD at bedside.

## 2014-09-19 NOTE — ED Notes (Addendum)
Pt from home via [facility] c/o overdose on approximately 250-300  25 mg tablets of benadryl. Pt hallucinating, shaking, Nystagmus present, and easily startled. Difficult to obtain information from. PT's mother called 911 and reported she had only taken about 10 tablets of benadryl out of the bottle and quantity of bottle is from 250-300. Per mother pt  is alert and oriented. Pt slouching to right side. Per [facility] patient vomited en route and NO pill fragments seen. Pt is alert to self, birthday, and place

## 2014-09-19 NOTE — ED Provider Notes (Signed)
CSN: [id number]     Arrival date & time 09/19/14  4098 History   First MD Initiated Contact with Patient 09/19/14 [phone]     Chief Complaint  Patient presents with  . Drug Overdose     (Consider location/radiation/quality/duration/timing/severity/associated sxs/prior Treatment) HPI 24 year old male presents to emergency department via EMS from home after overdose.  Per EMS, mother reports a missing bottle of Benadryl that contained up to 300 tablets of 25 mg Benadryl.  It is reported that the bottle may be had 10 tablets missing.  Patient arrives with hallucinations, nystagmus, and altered mental status.  Patient reports this was a suicide attempt.  He reports he took "a lot of pills".  Patient reports he took the pills at 2 or 3:00, but it is unclear whether this was in the afternoon or early morning hours.  Patient has history of bipolar and schizophrenia.  He is not currently on any medications per EMS from mother's information.  Mother currently not available for questioning.  Asian unable to give any other history.  No known coingestions.  Patient with vomiting in route, no pill fragments noted in emesis Past Medical History  Diagnosis Date  . Hand fracture, right 2008    no surgery required  . Hand fracture, left 2010  . Depression   . Schizophrenia   . Bipolar 1 disorder    Past Surgical History  Procedure Laterality Date  . Left hand fracture Left 2010    Dr Magnus Ivan  . Cyst excision     No family history on file. Social History  Substance Use Topics  . Smoking status: Never Smoker   . Smokeless tobacco: Never Used  . Alcohol Use: No     Comment: He drank this time in effort to hurt self/ usually non drinker    Review of Systems Level V caveat, confusion   Allergies  Tramadol and Vicodin  Home Medications   Prior to Admission medications   Medication Sig Start Date End Date Taking? Authorizing Provider  cloNIDine (CATAPRES) 0.1 MG tablet Take 1 tablet (0.1 mg  total) by mouth daily before breakfast. 06/19/14   Adonis Brook, NP  FLUoxetine (PROZAC) 20 MG capsule Take 1 capsule (20 mg total) by mouth daily. 06/17/14   Adonis Brook, NP  hydrOXYzine (ATARAX/VISTARIL) 25 MG tablet Take 1 tablet (25 mg total) by mouth every 6 (six) hours as needed for anxiety. 06/17/14   Adonis Brook, NP  methocarbamol (ROBAXIN) 500 MG tablet Take 1 tablet (500 mg total) by mouth every 8 (eight) hours as needed for muscle spasms. 06/17/14   Adonis Brook, NP   BP 164/103 mmHg  Pulse 126  Temp(Src) 98.2 F (36.8 C) (Oral)  Resp 16  SpO2 97% Physical Exam  Constitutional: He appears distressed.  Obese male, altered with nystagmus, appears uncomfortable, easily startled  Eyes: Pupils are equal, round, and reactive to light.  Neck: Normal range of motion. Neck supple. No JVD present. No tracheal deviation present. No thyromegaly present.  Cardiovascular: Regular rhythm and intact distal pulses.  Exam reveals no gallop and no friction rub.   No murmur heard. Tachycardia  Pulmonary/Chest: Effort normal and breath sounds normal. No stridor. No respiratory distress. He has no wheezes. He has no rales. He exhibits no tenderness.  Abdominal: Soft. He exhibits no distension and no mass. There is no tenderness. There is no rebound and no guarding.  Bowel sounds hypoactive  Musculoskeletal: He exhibits no edema or tenderness.  Lymphadenopathy:  He has no cervical adenopathy.  Neurological: He exhibits abnormal muscle tone. Coordination abnormal.  Nystagmus noted.    Skin: Skin is warm. No rash noted. No pallor.  Psychiatric:  Patient reports suicide attempt via overdose.  Nursing note and vitals reviewed.   ED Course  Procedures (including critical care time) Labs Review Labs Reviewed  COMPREHENSIVE METABOLIC PANEL - Abnormal; Notable for the following:    Potassium 3.2 (*)    AST 52 (*)    ALT 78 (*)    All other components within normal limits  ACETAMINOPHEN  LEVEL - Abnormal; Notable for the following:    Acetaminophen (Tylenol), Serum <10 (*)    All other components within normal limits  CBC - Abnormal; Notable for the following:    WBC 13.5 (*)    All other components within normal limits  ETHANOL  SALICYLATE LEVEL  URINE RAPID DRUG SCREEN, HOSP PERFORMED  URINALYSIS, ROUTINE W REFLEX MICROSCOPIC (NOT AT [facility])  CBG MONITORING, ED    Imaging Review No results found. I have personally reviewed and evaluated these images and lab results as part of my medical decision-making.   EKG Interpretation   Date/Time:  "Sunday September 19 2014 06:22:54 EDT Ventricular Rate:  127 PR Interval:  56 QRS Duration: 86 QT Interval:  304 QTC Calculation: 442 R Axis:   17 Text Interpretation:  Sinus tachycardia Consider right atrial enlargement  Borderline repolarization abnormality Confirmed by Chibuikem Thang  MD, Airanna Partin ([id number])  on 09/19/2014 6:33:50 AM     CRITICAL CARE Performed by: Moshe Wenger M Total critical care time: 60 min Critical care time was exclusive of separately billable procedures and treating other patients. Critical care was necessary to treat or prevent imminent or life-threatening deterioration. Critical care was time spent personally by me on the following activities: development of treatment plan with patient and/or surrogate as well as nursing, discussions with consultants, evaluation of patient's response to treatment, examination of patient, obtaining history from patient or surrogate, ordering and performing treatments and interventions, ordering and review of laboratory studies, ordering and review of radiographic studies, pulse oximetry and re-evaluation of patient's condition.  MDM   Final diagnoses:  Diphenhydramine overdose, intentional self-harm, initial encounter  Suicidal ideation     24"  year old male with significant intentional overdose of Benadryl.  Case was discussed with Judeth Cornfield at poison control.  Patient to remain on  monitor, monitoring QRS widening, bicarbonate if needed.  Aggressive fluid hydration.  Seizure precautions.  Benzodiazepine for agitation and seizures.  Should he have seizures, intractable to benzos, phenobarbital would be the next choice.  Patient is at risk for prolonged side effects from his ingestion due to delayed gastric motility.  He may also have urinary retention secondary to ingestion.    Marisa Severin, MD 09/19/14 [phone]

## 2014-09-20 DIAGNOSIS — R45851 Suicidal ideations: Secondary | ICD-10-CM

## 2014-09-20 DIAGNOSIS — T450X2D Poisoning by antiallergic and antiemetic drugs, intentional self-harm, subsequent encounter: Secondary | ICD-10-CM

## 2014-09-20 DIAGNOSIS — E876 Hypokalemia: Secondary | ICD-10-CM

## 2014-09-20 DIAGNOSIS — T50902A Poisoning by unspecified drugs, medicaments and biological substances, intentional self-harm, initial encounter: Secondary | ICD-10-CM

## 2014-09-20 LAB — BASIC METABOLIC PANEL
ANION GAP: 11 (ref 5–15)
ANION GAP: 9 (ref 5–15)
BUN: <5 mg/dL — ABNORMAL LOW (ref 6–20)
BUN: <5 mg/dL — ABNORMAL LOW (ref 6–20)
CALCIUM: 8.6 mg/dL — AB (ref 8.9–10.3)
CO2: 25 mmol/L (ref 22–32)
CO2: 24 mmol/L (ref 22–32)
Calcium: 8.6 mg/dL — ABNORMAL LOW (ref 8.9–10.3)
Chloride: 104 mmol/L (ref 101–111)
Chloride: 104 mmol/L (ref 101–111)
Creatinine, Ser: 0.7 mg/dL (ref 0.61–1.24)
Creatinine, Ser: 0.63 mg/dL (ref 0.61–1.24)
GLUCOSE: 83 mg/dL (ref 65–99)
Glucose, Bld: 87 mg/dL (ref 65–99)
POTASSIUM: 2.9 mmol/L — AB (ref 3.5–5.1)
POTASSIUM: 3.8 mmol/L (ref 3.5–5.1)
SODIUM: 137 mmol/L (ref 135–145)
Sodium: 140 mmol/L (ref 135–145)

## 2014-09-20 LAB — CBC
HCT: 46.4 % (ref 39.0–52.0)
Hemoglobin: 15.9 g/dL (ref 13.0–17.0)
MCH: 29.9 pg (ref 26.0–34.0)
MCHC: 34.3 g/dL (ref 30.0–36.0)
MCV: 87.2 fL (ref 78.0–100.0)
PLATELETS: 245 10*3/uL (ref 150–400)
RBC: 5.32 MIL/uL (ref 4.22–5.81)
RDW: 12.2 % (ref 11.5–15.5)
WBC: 14.5 10*3/uL — AB (ref 4.0–10.5)

## 2014-09-20 LAB — HEPATIC FUNCTION PANEL
ALBUMIN: 4.8 g/dL (ref 3.5–5.0)
ALK PHOS: 88 U/L (ref 38–126)
ALT: 63 U/L (ref 17–63)
AST: 41 U/L (ref 15–41)
Bilirubin, Direct: 0.2 mg/dL (ref 0.1–0.5)
Indirect Bilirubin: 0.8 mg/dL (ref 0.3–0.9)
TOTAL PROTEIN: 7.6 g/dL (ref 6.5–8.1)
Total Bilirubin: 1 mg/dL (ref 0.3–1.2)

## 2014-09-20 LAB — MAGNESIUM
MAGNESIUM: 1.5 mg/dL — AB (ref 1.7–2.4)
MAGNESIUM: 2.4 mg/dL (ref 1.7–2.4)

## 2014-09-20 LAB — CK: CK TOTAL: 602 U/L — AB (ref 49–397)

## 2014-09-20 LAB — TROPONIN I: Troponin I: <0.03 ng/mL (ref <0.031)

## 2014-09-20 LAB — PHOSPHORUS: PHOSPHORUS: 2.5 mg/dL (ref 2.5–4.6)

## 2014-09-20 MED ORDER — MAGNESIUM SULFATE 2 GM/50ML IV SOLN
2.0000 g | Freq: Once | INTRAVENOUS | Status: AC
  Administered 2014-09-20: 2 g via INTRAVENOUS
  Filled 2014-09-20: qty 50

## 2014-09-20 MED ORDER — POTASSIUM CHLORIDE 10 MEQ/100ML IV SOLN
10.0000 meq | INTRAVENOUS | Status: AC
  Administered 2014-09-20 (×4): 10 meq via INTRAVENOUS
  Filled 2014-09-20 (×4): qty 100

## 2014-09-20 MED ORDER — HYDRALAZINE HCL 20 MG/ML IJ SOLN
5.0000 mg | INTRAMUSCULAR | Status: DC | PRN
  Administered 2014-09-20: 5 mg via INTRAVENOUS

## 2014-09-20 MED ORDER — POTASSIUM CHLORIDE CRYS ER 20 MEQ PO TBCR
40.0000 meq | EXTENDED_RELEASE_TABLET | Freq: Once | ORAL | Status: AC
  Administered 2014-09-20: 40 meq via ORAL
  Filled 2014-09-20: qty 2

## 2014-09-20 MED ORDER — OXYCODONE HCL 5 MG PO TABS
5.0000 mg | ORAL_TABLET | ORAL | Status: DC | PRN
  Administered 2014-09-20 – 2014-09-21 (×3): 5 mg via ORAL
  Filled 2014-09-20 (×4): qty 1

## 2014-09-20 MED ORDER — ACETAMINOPHEN 325 MG PO TABS
650.0000 mg | ORAL_TABLET | Freq: Four times a day (QID) | ORAL | Status: DC | PRN
  Administered 2014-09-20: 650 mg via ORAL
  Filled 2014-09-20: qty 2

## 2014-09-20 MED ORDER — AMLODIPINE BESYLATE 5 MG PO TABS
5.0000 mg | ORAL_TABLET | Freq: Every day | ORAL | Status: DC
  Administered 2014-09-20 – 2014-09-21 (×2): 5 mg via ORAL
  Filled 2014-09-20 (×2): qty 1

## 2014-09-20 NOTE — Progress Notes (Signed)
eLink Physician-Brief Progress Note Patient Name: Miguel [name] DOB: 1990-03-06 MRN: [id number]   Date of Service  09/20/2014  HPI/Events of Note  Multiple issues: 1. BP = 173/116, 2. K+ = 2.9, Mg++ = 1.5 and Creatinine = 0.7.  eICU Interventions  Will order: 1. Replete Mg++ and K+ 2. Check BMP and Mg++ level at 12 noon. 3. Increase Hydralazine dose to Q 4 hours.      Intervention Category Major Interventions: Hypertension - evaluation and management Intermediate Interventions: Electrolyte abnormality - evaluation and management  Sommer,Steven Eugene 09/20/2014, 6:18 AM

## 2014-09-20 NOTE — Consult Note (Signed)
[facility] Face-to-Face Psychiatry Consult Follow up  Reason for Consult:  Suicide Attempt  Referring Physician:  Dr. Vaughan Browner  Patient Identification: Miguel [name] MRN:  [id number] Principal Diagnosis: Suicide attempt by overdose  Diagnosis:   Patient Active Problem List   Diagnosis Date Noted  . Suicide attempt [T14.91] 09/19/2014  . Nightmares [F51.5]   . PTSD (post-traumatic stress disorder) [F43.10] 06/11/2014  . Severe recurrent major depression without psychotic features [F33.2] 06/11/2014    Total Time spent with patient: 30 minutes  Subjective:   Miguel [name] is a 24 y.o. male patient admitted with  Suicide attempt by overdose   HPI:   Patient is a 24 year old man. He is currently unable to provide much history as he  Remains confused, delirious at this time . He is  , however,  Able to provide some valuable  Information as below .  He acknowledges he attempted suicide by overdosing . States it was " spur of the moment", not planned out, and states he  Has been  feeling " tired of life". He is unable to tell me what medication or how many tablets he took, but as per chart notes, overdosed on several 100 mgr tablets of  Benadryl. He presented to ED with changes in mental status, confusion following overdose . Patient endorses depression, sadness, suicidal ideations.  He states he was taking Prozac in the past , but cannot say if he had stopped it or if it helped him.   Of note, he denies alcohol abuse , denies drug abuse, but does mention opiate medications, but is unable to specify if he has been taking this type of medication or  When he last took them  . His admission UDS is positive for BZDs and for  Cocaine, but negative for opiates . Admission BAL is negative . 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  In reviewing chart, patient had a psychiatric admission at [facility] in May /2016, for depression, suicide attempt by overdosing on a  muscle relaxant, and a history of depression / panic attacks, following death of  An infant daughter  In 2013. He was diagnosed with MDD. Apparently was also given clonidine at the time to address opiate withdrawal symptoms. He was started on Prozac and was discharged much improved .   HPI Elements:   Acute confusion/mental status changes related to Suicide attempt , severe, by overdosing on  An Antihistamine. Prior history of Mood Disorder, suicide attempts .   Interval history: Patient is seen with psych social service for status post suicide attempt. Reportedly he has been depressed and has intentional overdose of benadryl which resulted confusion and altered mental state. He has history of [facility] admission for suicide attempt with muscle relaxants in May 2016. Patient was seen and evaluated initially by Dr. Parke Poisson this weekend and concern about possible benzo's withdrawal and suggested ativan protocol. 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 Patient sleeping and could not wake him up with verbal stimuli. Will seen him later for further assessment.   Past Medical History:  Past Medical History  Diagnosis Date  . Hand fracture, right 2008    no surgery required  . Hand fracture, left 2010  . Depression   . Schizophrenia   . Bipolar 1 disorder     Past Surgical History  Procedure Laterality Date  .  Left hand fracture Left 2010    Dr Magnus Ivan  . Cyst excision     Family History: History reviewed. No pertinent family history. Social History:  History  Alcohol Use No    Comment: He drank this time in effort to hurt self/ usually non drinker     History  Drug Use No    Social History   Social History  . Marital Status: Single    Spouse Name: N/A  . Number of Children: N/A  . Years of Education: N/A   Social History Main Topics  . Smoking status: Never Smoker   . Smokeless tobacco:  Never Used  . Alcohol Use: No     Comment: He drank this time in effort to hurt self/ usually non drinker  . Drug Use: No  . Sexual Activity: Yes    Birth Control/ Protection: None   Other Topics Concern  . None   Social History Narrative   Additional Social History:   Allergies:   Allergies  Allergen Reactions  . Tramadol Hives  . Vicodin [Hydrocodone-Acetaminophen] Nausea Only    Labs:  Results for orders placed or performed during the hospital encounter of 09/19/14 (from the past 48 hour(s))  Comprehensive metabolic panel     Status: Abnormal   Collection Time: 09/19/14  6:40 AM  Result Value Ref Range   Sodium 141 135 - 145 mmol/L   Potassium 3.2 (L) 3.5 - 5.1 mmol/L   Chloride 107 101 - 111 mmol/L   CO2 23 22 - 32 mmol/L   Glucose, Bld 88 65 - 99 mg/dL   BUN 7 6 - 20 mg/dL   Creatinine, Ser 3.59 0.61 - 1.24 mg/dL   Calcium 9.5 8.9 - 72.2 mg/dL   Total Protein 8.0 6.5 - 8.1 g/dL   Albumin 4.6 3.5 - 5.0 g/dL   AST 52 (H) 15 - 41 U/L   ALT 78 (H) 17 - 63 U/L   Alkaline Phosphatase 94 38 - 126 U/L   Total Bilirubin 0.6 0.3 - 1.2 mg/dL   GFR calc non Af Amer >60 >60 mL/min   GFR calc Af Amer >60 >60 mL/min    Comment: (NOTE) The eGFR has been calculated using the CKD EPI equation. This calculation has not been validated in all clinical situations. eGFR's persistently <60 mL/min signify possible Chronic Kidney Disease.    Anion gap 11 5 - 15  Ethanol (ETOH)     Status: None   Collection Time: 09/19/14  6:40 AM  Result Value Ref Range   Alcohol, Ethyl (B) <5 <5 mg/dL    Comment:        LOWEST DETECTABLE LIMIT FOR SERUM ALCOHOL IS 5 mg/dL FOR MEDICAL PURPOSES ONLY   Salicylate level     Status: None   Collection Time: 09/19/14  6:40 AM  Result Value Ref Range   Salicylate Lvl <4.0 2.8 - 30.0 mg/dL  Acetaminophen level     Status: Abnormal   Collection Time: 09/19/14  6:40 AM  Result Value Ref Range   Acetaminophen (Tylenol), Serum <10 (L) 10 - 30 ug/mL     Comment:        THERAPEUTIC CONCENTRATIONS VARY SIGNIFICANTLY. A RANGE OF 10-30 ug/mL MAY BE AN EFFECTIVE CONCENTRATION FOR MANY PATIENTS. HOWEVER, SOME ARE BEST TREATED AT CONCENTRATIONS OUTSIDE THIS RANGE. ACETAMINOPHEN CONCENTRATIONS >150 ug/mL AT 4 HOURS AFTER INGESTION AND >50 ug/mL AT 12 HOURS AFTER INGESTION ARE OFTEN ASSOCIATED WITH TOXIC REACTIONS.   CBC  Status: Abnormal   Collection Time: 09/19/14  6:40 AM  Result Value Ref Range   WBC 13.5 (H) 4.0 - 10.5 K/uL   RBC 5.65 4.22 - 5.81 MIL/uL   Hemoglobin 16.9 13.0 - 17.0 g/dL   HCT 48.9 39.0 - 52.0 %   MCV 86.5 78.0 - 100.0 fL   MCH 29.9 26.0 - 34.0 pg   MCHC 34.6 30.0 - 36.0 g/dL   RDW 11.9 11.5 - 15.5 %   Platelets 269 150 - 400 K/uL  Urine rapid drug screen (hosp performed) (Not at [facility])     Status: Abnormal   Collection Time: 09/19/14  7:18 AM  Result Value Ref Range   Opiates NONE DETECTED NONE DETECTED   Cocaine POSITIVE (A) NONE DETECTED   Benzodiazepines POSITIVE (A) NONE DETECTED   Amphetamines NONE DETECTED NONE DETECTED   Tetrahydrocannabinol NONE DETECTED NONE DETECTED   Barbiturates NONE DETECTED NONE DETECTED    Comment:        DRUG SCREEN FOR MEDICAL PURPOSES ONLY.  IF CONFIRMATION IS NEEDED FOR ANY PURPOSE, NOTIFY LAB WITHIN 5 DAYS.        LOWEST DETECTABLE LIMITS FOR URINE DRUG SCREEN Drug Class       Cutoff (ng/mL) Amphetamine      1000 Barbiturate      200 Benzodiazepine   295 Tricyclics       188 Opiates          300 Cocaine          300 THC              50   Urinalysis, Routine w reflex microscopic (not at [facility])     Status: None   Collection Time: 09/19/14  7:19 AM  Result Value Ref Range   Color, Urine YELLOW YELLOW   APPearance CLEAR CLEAR   Specific Gravity, Urine 1.009 1.005 - 1.030   pH 6.0 5.0 - 8.0   Glucose, UA NEGATIVE NEGATIVE mg/dL   Hgb urine dipstick NEGATIVE NEGATIVE   Bilirubin Urine NEGATIVE NEGATIVE   Ketones, ur NEGATIVE NEGATIVE mg/dL   Protein, ur  NEGATIVE NEGATIVE mg/dL   Urobilinogen, UA 0.2 0.0 - 1.0 mg/dL   Nitrite NEGATIVE NEGATIVE   Leukocytes, UA NEGATIVE NEGATIVE    Comment: MICROSCOPIC NOT DONE ON URINES WITH NEGATIVE PROTEIN, BLOOD, LEUKOCYTES, NITRITE, OR GLUCOSE <1000 mg/dL.  CBG monitoring, ED     Status: None   Collection Time: 09/19/14  7:48 AM  Result Value Ref Range   Glucose-Capillary 81 65 - 99 mg/dL  MRSA PCR Screening     Status: None   Collection Time: 09/19/14  9:46 AM  Result Value Ref Range   MRSA by PCR NEGATIVE NEGATIVE    Comment:        The GeneXpert MRSA Assay (FDA approved for NASAL specimens only), is one component of a comprehensive MRSA colonization surveillance program. It is not intended to diagnose MRSA infection nor to guide or monitor treatment for MRSA infections.   Comprehensive metabolic panel     Status: Abnormal   Collection Time: 09/19/14 10:11 AM  Result Value Ref Range   Sodium 142 135 - 145 mmol/L   Potassium 3.6 3.5 - 5.1 mmol/L   Chloride 107 101 - 111 mmol/L   CO2 24 22 - 32 mmol/L   Glucose, Bld 89 65 - 99 mg/dL   BUN 5 (L) 6 - 20 mg/dL   Creatinine, Ser 0.73 0.61 - 1.24 mg/dL   Calcium  9.1 8.9 - 10.3 mg/dL   Total Protein 7.5 6.5 - 8.1 g/dL   Albumin 4.6 3.5 - 5.0 g/dL   AST 50 (H) 15 - 41 U/L   ALT 74 (H) 17 - 63 U/L   Alkaline Phosphatase 93 38 - 126 U/L   Total Bilirubin 0.5 0.3 - 1.2 mg/dL   GFR calc non Af Amer >60 >60 mL/min   GFR calc Af Amer >60 >60 mL/min    Comment: (NOTE) The eGFR has been calculated using the CKD EPI equation. This calculation has not been validated in all clinical situations. eGFR's persistently <60 mL/min signify possible Chronic Kidney Disease.    Anion gap 11 5 - 15  Magnesium     Status: Abnormal   Collection Time: 09/19/14 10:11 AM  Result Value Ref Range   Magnesium 1.4 (L) 1.7 - 2.4 mg/dL  Phosphorus     Status: None   Collection Time: 09/19/14 10:11 AM  Result Value Ref Range   Phosphorus 3.4 2.5 - 4.6 mg/dL   Troponin I     Status: None   Collection Time: 09/19/14 10:11 AM  Result Value Ref Range   Troponin I <0.03 <0.031 ng/mL    Comment:        NO INDICATION OF MYOCARDIAL INJURY.   CBC WITH DIFFERENTIAL     Status: Abnormal   Collection Time: 09/19/14 10:11 AM  Result Value Ref Range   WBC 17.6 (H) 4.0 - 10.5 K/uL   RBC 5.45 4.22 - 5.81 MIL/uL   Hemoglobin 16.3 13.0 - 17.0 g/dL   HCT 47.0 39.0 - 52.0 %   MCV 86.2 78.0 - 100.0 fL   MCH 29.9 26.0 - 34.0 pg   MCHC 34.7 30.0 - 36.0 g/dL   RDW 12.0 11.5 - 15.5 %   Platelets 258 150 - 400 K/uL   Neutrophils Relative % 88 (H) 43 - 77 %   Neutro Abs 15.4 (H) 1.7 - 7.7 K/uL   Lymphocytes Relative 8 (L) 12 - 46 %   Lymphs Abs 1.5 0.7 - 4.0 K/uL   Monocytes Relative 4 3 - 12 %   Monocytes Absolute 0.7 0.1 - 1.0 K/uL   Eosinophils Relative 0 0 - 5 %   Eosinophils Absolute 0.0 0.0 - 0.7 K/uL   Basophils Relative 0 0 - 1 %   Basophils Absolute 0.0 0.0 - 0.1 K/uL  CK     Status: None   Collection Time: 09/19/14 10:11 AM  Result Value Ref Range   Total CK 108 49 - 397 U/L  Hepatic function panel     Status: Abnormal   Collection Time: 09/19/14 10:11 AM  Result Value Ref Range   Total Protein 7.6 6.5 - 8.1 g/dL   Albumin 4.5 3.5 - 5.0 g/dL   AST 51 (H) 15 - 41 U/L   ALT 73 (H) 17 - 63 U/L   Alkaline Phosphatase 93 38 - 126 U/L   Total Bilirubin 0.8 0.3 - 1.2 mg/dL   Bilirubin, Direct 0.2 0.1 - 0.5 mg/dL   Indirect Bilirubin 0.6 0.3 - 0.9 mg/dL  Troponin I     Status: None   Collection Time: 09/19/14  2:56 PM  Result Value Ref Range   Troponin I <0.03 <0.031 ng/mL    Comment:        NO INDICATION OF MYOCARDIAL INJURY.   Troponin I     Status: None   Collection Time: 09/19/14  9:12 PM  Result Value  Ref Range   Troponin I <0.03 <0.031 ng/mL    Comment:        NO INDICATION OF MYOCARDIAL INJURY.   Troponin I (q 6hr x 3)     Status: None   Collection Time: 09/20/14  3:35 AM  Result Value Ref Range   Troponin I <0.03 <0.031  ng/mL    Comment:        NO INDICATION OF MYOCARDIAL INJURY.   CBC     Status: Abnormal   Collection Time: 09/20/14  3:35 AM  Result Value Ref Range   WBC 14.5 (H) 4.0 - 10.5 K/uL   RBC 5.32 4.22 - 5.81 MIL/uL   Hemoglobin 15.9 13.0 - 17.0 g/dL   HCT 46.4 39.0 - 52.0 %   MCV 87.2 78.0 - 100.0 fL   MCH 29.9 26.0 - 34.0 pg   MCHC 34.3 30.0 - 36.0 g/dL   RDW 12.2 11.5 - 15.5 %   Platelets 245 150 - 400 K/uL  Basic metabolic panel     Status: Abnormal   Collection Time: 09/20/14  3:35 AM  Result Value Ref Range   Sodium 140 135 - 145 mmol/L   Potassium 2.9 (L) 3.5 - 5.1 mmol/L    Comment: DELTA CHECK NOTED REPEATED TO VERIFY    Chloride 104 101 - 111 mmol/L   CO2 25 22 - 32 mmol/L   Glucose, Bld 83 65 - 99 mg/dL   BUN <5 (L) 6 - 20 mg/dL   Creatinine, Ser 0.70 0.61 - 1.24 mg/dL   Calcium 8.6 (L) 8.9 - 10.3 mg/dL   GFR calc non Af Amer >60 >60 mL/min   GFR calc Af Amer >60 >60 mL/min    Comment: (NOTE) The eGFR has been calculated using the CKD EPI equation. This calculation has not been validated in all clinical situations. eGFR's persistently <60 mL/min signify possible Chronic Kidney Disease.    Anion gap 11 5 - 15  Magnesium     Status: Abnormal   Collection Time: 09/20/14  3:35 AM  Result Value Ref Range   Magnesium 1.5 (L) 1.7 - 2.4 mg/dL  Phosphorus     Status: None   Collection Time: 09/20/14  3:35 AM  Result Value Ref Range   Phosphorus 2.5 2.5 - 4.6 mg/dL  Hepatic function panel     Status: None   Collection Time: 09/20/14  3:35 AM  Result Value Ref Range   Total Protein 7.6 6.5 - 8.1 g/dL   Albumin 4.8 3.5 - 5.0 g/dL   AST 41 15 - 41 U/L   ALT 63 17 - 63 U/L   Alkaline Phosphatase 88 38 - 126 U/L   Total Bilirubin 1.0 0.3 - 1.2 mg/dL   Bilirubin, Direct 0.2 0.1 - 0.5 mg/dL   Indirect Bilirubin 0.8 0.3 - 0.9 mg/dL  CK     Status: Abnormal   Collection Time: 09/20/14  3:35 AM  Result Value Ref Range   Total CK 602 (H) 49 - 397 U/L  Basic metabolic  panel     Status: Abnormal   Collection Time: 09/20/14 11:39 AM  Result Value Ref Range   Sodium 137 135 - 145 mmol/L   Potassium 3.8 3.5 - 5.1 mmol/L    Comment: RESULT REPEATED AND VERIFIED DELTA CHECK NOTED    Chloride 104 101 - 111 mmol/L   CO2 24 22 - 32 mmol/L   Glucose, Bld 87 65 - 99 mg/dL   BUN <5 (L) 6 -  20 mg/dL   Creatinine, Ser 0.63 0.61 - 1.24 mg/dL   Calcium 8.6 (L) 8.9 - 10.3 mg/dL   GFR calc non Af Amer >60 >60 mL/min   GFR calc Af Amer >60 >60 mL/min    Comment: (NOTE) The eGFR has been calculated using the CKD EPI equation. This calculation has not been validated in all clinical situations. eGFR's persistently <60 mL/min signify possible Chronic Kidney Disease.    Anion gap 9 5 - 15  Magnesium     Status: None   Collection Time: 09/20/14 11:39 AM  Result Value Ref Range   Magnesium 2.4 1.7 - 2.4 mg/dL    Vitals: Blood pressure 139/92, pulse 78, temperature 98.6 F (37 C), temperature source Oral, resp. rate 16, height $RemoveBe'5\' 8"'[id number]$  (1.727 m), weight 145.9 kg (321 lb 10.4 oz), SpO2 97 %.  Risk to Self: Is patient at risk for suicide?: Yes Risk to Others:   Prior Inpatient Therapy:   Prior Outpatient Therapy:    Current Facility-Administered Medications  Medication Dose Route Frequency Provider Last Rate Last Dose  . 0.9 %  sodium chloride infusion  250 mL Intravenous PRN Praveen Mannam, MD      . 0.9 %  sodium chloride infusion   Intravenous Continuous Praveen Mannam, MD 125 mL/hr at 09/20/14 1001    . amLODipine (NORVASC) tablet 5 mg  5 mg Oral Daily Juanito Doom, MD   5 mg at 09/20/14 1503  . folic acid (FOLVITE) tablet 1 mg  1 mg Oral Daily Praveen Mannam, MD   1 mg at 09/20/14 1001  . heparin injection 5,000 Units  5,000 Units Subcutaneous 3 times per day Marshell Garfinkel, MD   5,000 Units at 09/20/14 0637  . hydrALAZINE (APRESOLINE) injection 5 mg  5 mg Intravenous Q4H PRN Anders Simmonds, MD   5 mg at 09/20/14 4193  . LORazepam (ATIVAN) injection 0.5-1  mg  0.5-1 mg Intravenous Q4H PRN Praveen Mannam, MD   1 mg at 09/20/14 1032  . LORazepam (ATIVAN) injection 2 mg  2 mg Intravenous Q6H Praveen Mannam, MD   2 mg at 09/20/14 0810  . thiamine (VITAMIN B-1) tablet 100 mg  100 mg Oral Daily Praveen Mannam, MD   100 mg at 09/19/14 1854    Musculoskeletal: Strength & Muscle Tone: some tremulousness noted  Gait & Station: gait not examined, patient in bed  Patient leans: N/A  Psychiatric Specialty Exam: Physical Exam  ROS  Blood pressure 139/92, pulse 78, temperature 98.6 F (37 C), temperature source Oral, resp. rate 16, height $RemoveBe'5\' 8"'oLPcBLXGW$  (1.727 m), weight 145.9 kg (321 lb 10.4 oz), SpO2 97 %.Body mass index is 48.92 kg/(m^2).  General Appearance: Fairly Groomed- in hospital garb   Eye Contact::  Good  Speech:  Slow  Volume:  Decreased  Mood:  Anxious and Depressed  Affect:  Constricted and anxious   Thought Process:  Disorganized  Orientation:  Other:  oriented to person, partially to time - August , disoriented to place   Thought Content:  denies hallucinations, but looks around the room at times as if internally preoccupied   Suicidal Thoughts:  Yes.  without intent/plan- currently does not endorse ongoing or active SI  Homicidal Thoughts:  No  Memory:  recent and remote poor   Judgement:  Impaired  Insight:  Lacking  Psychomotor Activity:  Restlessness  Concentration:  Fair  Recall:  [place]  Language: Fair  Akathisia:  Negative  Handed:  Right  AIMS (if indicated):     Assets:  Desire for Improvement Resilience  ADL's:  Impaired  Cognition:  Confused   Sleep:      Medical Decision Making: Review of Psycho-Social Stressors (1), Review or order clinical lab tests (1), Established Problem, Worsening (2) and Review of Medication Regimen & Side Effects (2)  Treatment Plan Summary: In addition to his underlying depression, confused and agitated , likely as a result of his overdose on antihistamines. However,  based on (+) BZD on UDS,  Agitation, restlessness, tremulousness, confusion, and elevated vitals, I would consider possible BZD withdrawal delirium.    Recommendations:    1. Consider standing Ativan Detox Protocol, Thiamine, Folate .   1. Patient meets criteria for inpatient psychiatric admission once medically cleared . 3. Status post suicide attempt: Chief Technology Officer . 4. Recommends NO antidepressant or other psychiatric medication, until confusion resolved  5. Appreciate psychiatric consultation and follow up as clinically required 6. Please contact [phone] or [phone] if needs further assistance  Plan:  Recommend psychiatric Inpatient admission when medically cleared.   Ardith Test,JANARDHAHA R. 09/20/2014 3:26 PM

## 2014-09-20 NOTE — Progress Notes (Signed)
eLink Physician-Brief Progress Note Patient Name: Miguel [name] DOB: [birth year] MRN: [id number]   Date of Service  09/20/2014  HPI/Events of Note  Chronic pain  eICU Interventions  Low dose PRN oxycodone     Intervention Category Intermediate Interventions: Pain - evaluation and management  Billy Fischer 09/20/2014, 4:36 PM

## 2014-09-20 NOTE — Progress Notes (Signed)
Pt refusing SCDs and Heparin SubQ. Educated. Justin Mend, RN

## 2014-09-20 NOTE — Progress Notes (Signed)
Pt belongings not brought to floor when transferred to rm 1514. Tech looked for belongings in ICU and pt states a bag w/ "black shorts, [employer] shirt and cell phone" is missing.

## 2014-09-20 NOTE — Progress Notes (Signed)
PULMONARY / CRITICAL CARE MEDICINE   Name: Miguel [name] MRN: [id number] DOB: December 23, 1990    ADMISSION DATE:  09/19/2014 CONSULTATION DATE:  09/19/14  REFERRING MD :  ED  CHIEF COMPLAINT:  Benadryl overdose, altered mental status.  INITIAL PRESENTATION: 24 Y/O male with H/O severe depression, PTSD presented with suicide attempt with benadryl.   STUDIES:  EKG-   SIGNIFICANT EVENTS: 8/21- Admitted for Benadryl overdose, suicide attempt   SUBJECTIVE:  Still confused, "[facility] owns everything"  VITAL SIGNS: Temp:  [98.3 F (36.8 C)-99.5 F (37.5 C)] 98.3 F (36.8 C) (08/22 0800) Pulse Rate:  [84-109] 92 (08/22 0800) Resp:  [18-34] 18 (08/22 0900) BP: (110-191)/(61-146) 132/93 mmHg (08/22 0900) SpO2:  [93 %-99 %] 95 % (08/22 0800) Weight:  [145.9 kg (321 lb 10.4 oz)] 145.9 kg (321 lb 10.4 oz) (08/22 0506) HEMODYNAMICS:   VENTILATOR SETTINGS:   INTAKE / OUTPUT:  Intake/Output Summary (Last 24 hours) at 09/20/14 1007 Last data filed at 09/20/14 0900  Gross per 24 hour  Intake   3050 ml  Output   2950 ml  Net    100 ml    PHYSICAL EXAMINATION: General:  Awake, alert, confused Neuro: awake, conversant, follows commands, MAEW HEENT: Mydriasis noted PULM: CTA B CV: RRR, no mgr GI: BS+, soft, non-tender MSK: normal bulk and tone Derm: dry, slightly red in cheeks  LABS:  CBC  Recent Labs Lab 09/19/14 0640 09/19/14 1011 09/20/14 0335  WBC 13.5* 17.6* 14.5*  HGB 16.9 16.3 15.9  HCT 48.9 47.0 46.4  PLT 269 258 245   Coag's No results for input(s): APTT, INR in the last 168 hours. BMET  Recent Labs Lab 09/19/14 0640 09/19/14 1011 09/20/14 0335  NA 141 142 140  K 3.2* 3.6 2.9*  CL 107 107 104  CO2 23 24 25   BUN 7 5* <5*  CREATININE 0.79 0.73 0.70  GLUCOSE 88 89 83   Electrolytes  Recent Labs Lab 09/19/14 0640 09/19/14 1011 09/20/14 0335  CALCIUM 9.5 9.1 8.6*  MG  --  1.4* 1.5*  PHOS  --  3.4 2.5   Sepsis Markers No results for  input(s): LATICACIDVEN, PROCALCITON, O2SATVEN in the last 168 hours. ABG No results for input(s): PHART, PCO2ART, PO2ART in the last 168 hours. Liver Enzymes  Recent Labs Lab 09/19/14 0640 09/19/14 1011 09/20/14 0335  AST 52* 51*  50* 41  ALT 78* 73*  74* 63  ALKPHOS 94 93  93 88  BILITOT 0.6 0.8  0.5 1.0  ALBUMIN 4.6 4.5  4.6 4.8   Cardiac Enzymes  Recent Labs Lab 09/19/14 1456 09/19/14 2112 09/20/14 0335  TROPONINI <0.03 <0.03 <0.03   Glucose  Recent Labs Lab 09/19/14 0748  GLUCAP 81    Imaging No results found.  8/22 EKG> NSR, QRS OK, QTc 482, no ST wave changes  ASSESSMENT / PLAN:  PULMONARY A: No acute issues P:   Transfer to floor Monitor O2 saturation  CARDIOVASCULAR A: Mild QTc prolongation due to benadryl, no other classic benadryl findings Hypertension P:  Use hydralazine when necessary for hypertension.  D/C outpatient clonidine Start amlodipine Tele monitoring Continue IV fluid hydration > Normal saline at 125 mL per hour  RENAL A: Hypokalemia, Hypomag Urinary retention > foley placed, suspect was related to benadryl P:   D/c foley Monitor UOP Replete K, Mg  GASTROINTESTINAL A:  No issues P:   Advance diet  HEMATOLOGIC A: Stable P:    INFECTIOUS A:  No evidence of  infection P:   Monitor off antibiotics  NEUROLOGIC/PSYCHIATRY A: Acute encephalopathy due to benadryl ingestion > improving Suicide attempt, history of depression P:    Monitor for seizures Ativan PRN Suicide precautions Will need inpatient psyche this week   FAMILY  - Updates: Mother updated by phone on 8/22 AM - Inter-disciplinary family meet or Palliative Care meeting due by: 8/28  [place], MD [place] [facility] Pager: [phone] Cell: [phone] After 3pm or if no response, call [phone]   09/20/2014, 10:07 AM

## 2014-09-20 NOTE — Progress Notes (Signed)
Clinical Social Work  CSW went to room to evaluate patient. Patient sleeping and sitter at bedside. Sitter reports patient remains confused and recently needed Ativan due to agitation. CSW will follow up at later time to complete full assessment.  Miguel [name], [place] [phone]

## 2014-09-21 ENCOUNTER — Inpatient Hospital Stay
Admission: EM | Admit: 2014-09-21 | Discharge: 2014-09-23 | DRG: 885 | Disposition: A | Discharge status: Home / Self Care | Payer: No Typology Code available for payment source | Source: Other Acute Inpatient Hospital | Attending: Psychiatry | Admitting: Psychiatry

## 2014-09-21 ENCOUNTER — Encounter (HOSPITAL_COMMUNITY): Payer: Self-pay | Admitting: Psychiatry

## 2014-09-21 DIAGNOSIS — F1320 Sedative, hypnotic or anxiolytic dependence, uncomplicated: Secondary | ICD-10-CM | POA: Diagnosis present

## 2014-09-21 DIAGNOSIS — Z888 Allergy status to other drugs, medicaments and biological substances status: Secondary | ICD-10-CM | POA: Diagnosis not present

## 2014-09-21 DIAGNOSIS — Z79899 Other long term (current) drug therapy: Secondary | ICD-10-CM

## 2014-09-21 DIAGNOSIS — Z9889 Other specified postprocedural states: Secondary | ICD-10-CM

## 2014-09-21 DIAGNOSIS — T450X2A Poisoning by antiallergic and antiemetic drugs, intentional self-harm, initial encounter: Secondary | ICD-10-CM | POA: Diagnosis present

## 2014-09-21 DIAGNOSIS — I10 Essential (primary) hypertension: Secondary | ICD-10-CM | POA: Diagnosis present

## 2014-09-21 DIAGNOSIS — F17200 Nicotine dependence, unspecified, uncomplicated: Secondary | ICD-10-CM | POA: Diagnosis present

## 2014-09-21 DIAGNOSIS — F1420 Cocaine dependence, uncomplicated: Secondary | ICD-10-CM | POA: Diagnosis present

## 2014-09-21 DIAGNOSIS — Z9119 Patient's noncompliance with other medical treatment and regimen: Secondary | ICD-10-CM | POA: Diagnosis present

## 2014-09-21 DIAGNOSIS — Z818 Family history of other mental and behavioral disorders: Secondary | ICD-10-CM

## 2014-09-21 DIAGNOSIS — F332 Major depressive disorder, recurrent severe without psychotic features: Principal | ICD-10-CM | POA: Diagnosis present

## 2014-09-21 LAB — BASIC METABOLIC PANEL
ANION GAP: 11 (ref 5–15)
BUN: 6 mg/dL (ref 6–20)
CALCIUM: 9.1 mg/dL (ref 8.9–10.3)
CO2: 24 mmol/L (ref 22–32)
CREATININE: 0.88 mg/dL (ref 0.61–1.24)
Chloride: 103 mmol/L (ref 101–111)
Glucose, Bld: 75 mg/dL (ref 65–99)
Potassium: 4.5 mmol/L (ref 3.5–5.1)
SODIUM: 138 mmol/L (ref 135–145)

## 2014-09-21 MED ORDER — TRAZODONE HCL 100 MG PO TABS
100.0000 mg | ORAL_TABLET | Freq: Every evening | ORAL | Status: DC | PRN
  Administered 2014-09-21: 100 mg via ORAL
  Filled 2014-09-21: qty 1

## 2014-09-21 MED ORDER — LORAZEPAM 2 MG PO TABS
2.0000 mg | ORAL_TABLET | Freq: Three times a day (TID) | ORAL | Status: DC | PRN
  Administered 2014-09-21: 2 mg via ORAL
  Filled 2014-09-21: qty 1

## 2014-09-21 MED ORDER — HYDRALAZINE HCL 25 MG PO TABS
25.0000 mg | ORAL_TABLET | Freq: Three times a day (TID) | ORAL | Status: DC | PRN
  Administered 2014-09-21: 25 mg via ORAL
  Filled 2014-09-21: qty 1

## 2014-09-21 MED ORDER — IBUPROFEN 600 MG PO TABS
600.0000 mg | ORAL_TABLET | Freq: Four times a day (QID) | ORAL | Status: DC | PRN
  Filled 2014-09-21: qty 1

## 2014-09-21 MED ORDER — FOLIC ACID 1 MG PO TABS: 1.0000 mg | ORAL_TABLET | Freq: Every day | ORAL | Status: DC

## 2014-09-21 MED ORDER — AMLODIPINE BESYLATE 10 MG PO TABS: 10.0000 mg | ORAL_TABLET | Freq: Every day | ORAL | Status: DC

## 2014-09-21 MED ORDER — ACETAMINOPHEN 325 MG PO TABS
650.0000 mg | ORAL_TABLET | Freq: Four times a day (QID) | ORAL | Status: DC | PRN
  Administered 2014-09-21 – 2014-09-22 (×2): 650 mg via ORAL
  Filled 2014-09-21 (×2): qty 2

## 2014-09-21 MED ORDER — ALUM & MAG HYDROXIDE-SIMETH 200-200-20 MG/5ML PO SUSP: 30.0000 mL | ORAL | Status: DC | PRN

## 2014-09-21 MED ORDER — MAGNESIUM HYDROXIDE 400 MG/5ML PO SUSP: 30.0000 mL | Freq: Every day | ORAL | Status: DC | PRN

## 2014-09-21 MED ORDER — AMLODIPINE BESYLATE 5 MG PO TABS
5.0000 mg | ORAL_TABLET | Freq: Once | ORAL | Status: AC
  Administered 2014-09-21: 5 mg via ORAL
  Filled 2014-09-21 (×2): qty 1

## 2014-09-21 MED ORDER — FLUOXETINE HCL 20 MG PO CAPS
20.0000 mg | ORAL_CAPSULE | Freq: Every day | ORAL | Status: DC
  Administered 2014-09-22 – 2014-09-23 (×2): 20 mg via ORAL
  Filled 2014-09-21 (×2): qty 1

## 2014-09-21 MED ORDER — THIAMINE HCL 100 MG PO TABS: 100.0000 mg | ORAL_TABLET | Freq: Every day | ORAL | Status: DC

## 2014-09-21 MED ORDER — PROMETHAZINE HCL 25 MG PO TABS: 25.0000 mg | ORAL_TABLET | Freq: Three times a day (TID) | ORAL | Status: DC | PRN

## 2014-09-21 MED ORDER — NICOTINE 10 MG IN INHA: 1.0000 | RESPIRATORY_TRACT | Status: DC | PRN

## 2014-09-21 MED ORDER — AMLODIPINE BESYLATE 10 MG PO TABS
10.0000 mg | ORAL_TABLET | Freq: Every day | ORAL | Status: DC
  Administered 2014-09-22 – 2014-09-23 (×2): 10 mg via ORAL
  Filled 2014-09-21 (×2): qty 1

## 2014-09-21 NOTE — Progress Notes (Signed)
Pt req removal of foley. Pt alert and oriented x4, hospitalist notified of request, order placed for removal. Pt tolerated well
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
## 2014-09-21 NOTE — Tx Team (Signed)
Initial Interdisciplinary Treatment Plan   PATIENT STRESSORS: Loss of child. Medication change or noncompliance Substance abuse   PATIENT STRENGTHS: Ability for insight Motivation for treatment/growth Supportive family/friends   PROBLEM LIST: Problem List/Patient Goals Date to be addressed Date deferred Reason deferred Estimated date of resolution  Suicidal attempt / ideation 09/21/14           Substance abuse.  09/21/14           PTSD 09/21/14           Bipolar 09/21/14                  DISCHARGE CRITERIA:  Improved stabilization in mood, thinking, and/or behavior Motivation to continue treatment in a less acute level of care Verbal commitment to aftercare and medication compliance  PRELIMINARY DISCHARGE PLAN: Attend 12-step recovery group Participate in family therapy Return to previous living arrangement  PATIENT/FAMIILY INVOLVEMENT: This treatment plan has been presented to and reviewed with the patient, Miguel [name], The patient and family have been given the opportunity to ask questions and make suggestions.  Miguel [name] 09/21/2014, 8:55 PM

## 2014-09-21 NOTE — Consult Note (Signed)
[facility] Face-to-Face Psychiatry Consult Follow up  Reason for Consult:  Suicide Attempt  Referring Physician:  Dr. Vaughan Browner  Patient Identification: Miguel [name] MRN:  [id number] Principal Diagnosis: Suicide attempt by overdose  Diagnosis:   Patient Active Problem List   Diagnosis Date Noted  . Suicide attempt [T14.91] 09/19/2014  . Nightmares [F51.5]   . PTSD (post-traumatic stress disorder) [F43.10] 06/11/2014  . Severe recurrent major depression without psychotic features [F33.2] 06/11/2014    Total Time spent with patient: 30 minutes  Subjective:   Miguel [name] is a 24 y.o. male patient admitted with  Suicide attempt by overdose   HPI:   Patient is a 24 year old man. He is currently unable to provide much history as he  Remains confused, delirious at this time . He is  , however,  Able to provide some valuable  Information as below .  He acknowledges he attempted suicide by overdosing . States it was " spur of the moment", not planned out, and states he  Has been  feeling " tired of life". He is unable to tell me what medication or how many tablets he took, but as per chart notes, overdosed on several 100 mgr tablets of  Benadryl. He presented to ED with changes in mental status, confusion following overdose . Patient endorses depression, sadness, suicidal ideations.  He states he was taking Prozac in the past , but cannot say if he had stopped it or if it helped him.   Of note, he denies alcohol abuse , denies drug abuse, but does mention opiate medications, but is unable to specify if he has been taking this type of medication or  When he last took them  . His admission UDS is positive for BZDs and for  Cocaine, but negative for opiates . Admission BAL is negative . 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  In reviewing chart, patient had a psychiatric admission at [facility] in May /2016, for depression, suicide attempt by overdosing on a  muscle relaxant, and a history of depression / panic attacks, following death of  An infant daughter  In 2013. He was diagnosed with MDD. Apparently was also given clonidine at the time to address opiate withdrawal symptoms. He was started on Prozac and was discharged much improved .   HPI Elements:   Acute confusion/mental status changes related to Suicide attempt , severe, by overdosing on  An Antihistamine. Prior history of Mood Disorder, suicide attempts .   Interval history: Patient is seen with psych social service for status post suicide attempt. Patient appeared calm and cooperative and sitting in a chair next to his bed. Patient staff RN and Air cabin crew at bedside. Patient endorses intentional suicidal overdose of Benadryl because of ongoing symptoms of depression, anxiety, posttraumatic stress disorder, suicidal ideation and has no mental health provider for medication management and also counseling since he was discharged from the hospital. Patient is able to recognize this provider from outpatient medication management from [facility] focus when he was a teenager. Patient agrees with inpatient psychiatric hospitalization and requested different medication management then fluoxetine which patient stated not helpful. Patient has ongoing symptoms of depression, anxiety, dispense of sleep and appetite and suicidal thoughts. Patient cannot contract for safety during this hospitalization. Psychiatric social service reported patient may have inpatient psychiatric bed available [facility]    Past Medical History:  Past Medical History  Diagnosis Date  .  Hand fracture, right 2008    no surgery required  . Hand fracture, left 2010  . Depression   . Schizophrenia   . Bipolar 1 disorder     Past Surgical History  Procedure Laterality Date  . Left hand fracture Left 2010    Dr Ninfa Linden  . Cyst excision     Family History: History reviewed. No pertinent family  history. Social History:  History  Alcohol Use No    Comment: He drank this time in effort to hurt self/ usually non drinker     History  Drug Use No    Social History   Social History  . Marital Status: Single    Spouse Name: N/A  . Number of Children: N/A  . Years of Education: N/A   Social History Main Topics  . Smoking status: Never Smoker   . Smokeless tobacco: Never Used  . Alcohol Use: No     Comment: He drank this time in effort to hurt self/ usually non drinker  . Drug Use: No  . Sexual Activity: Yes    Birth Control/ Protection: None   Other Topics Concern  . None   Social History Narrative   Additional Social History:   Allergies:   Allergies  Allergen Reactions  . Tramadol Hives  . Vicodin [Hydrocodone-Acetaminophen] Nausea Only    Labs:  Results for orders placed or performed during the hospital encounter of 09/19/14 (from the past 48 hour(s))  Troponin I     Status: None   Collection Time: 09/19/14  2:56 PM  Result Value Ref Range   Troponin I <0.03 <0.031 ng/mL    Comment:        NO INDICATION OF MYOCARDIAL INJURY.   Troponin I     Status: None   Collection Time: 09/19/14  9:12 PM  Result Value Ref Range   Troponin I <0.03 <0.031 ng/mL    Comment:        NO INDICATION OF MYOCARDIAL INJURY.   Troponin I (q 6hr x 3)     Status: None   Collection Time: 09/20/14  3:35 AM  Result Value Ref Range   Troponin I <0.03 <0.031 ng/mL    Comment:        NO INDICATION OF MYOCARDIAL INJURY.   CBC     Status: Abnormal   Collection Time: 09/20/14  3:35 AM  Result Value Ref Range   WBC 14.5 (H) 4.0 - 10.5 K/uL   RBC 5.32 4.22 - 5.81 MIL/uL   Hemoglobin 15.9 13.0 - 17.0 g/dL   HCT 46.4 39.0 - 52.0 %   MCV 87.2 78.0 - 100.0 fL   MCH 29.9 26.0 - 34.0 pg   MCHC 34.3 30.0 - 36.0 g/dL   RDW 12.2 11.5 - 15.5 %   Platelets 245 150 - 400 K/uL  Basic metabolic panel     Status: Abnormal   Collection Time: 09/20/14  3:35 AM  Result Value Ref Range    Sodium 140 135 - 145 mmol/L   Potassium 2.9 (L) 3.5 - 5.1 mmol/L    Comment: DELTA CHECK NOTED REPEATED TO VERIFY    Chloride 104 101 - 111 mmol/L   CO2 25 22 - 32 mmol/L   Glucose, Bld 83 65 - 99 mg/dL   BUN <5 (L) 6 - 20 mg/dL   Creatinine, Ser 0.70 0.61 - 1.24 mg/dL   Calcium 8.6 (L) 8.9 - 10.3 mg/dL   GFR calc non Af Amer >60 >60  mL/min   GFR calc Af Amer >60 >60 mL/min    Comment: (NOTE) The eGFR has been calculated using the CKD EPI equation. This calculation has not been validated in all clinical situations. eGFR's persistently <60 mL/min signify possible Chronic Kidney Disease.    Anion gap 11 5 - 15  Magnesium     Status: Abnormal   Collection Time: 09/20/14  3:35 AM  Result Value Ref Range   Magnesium 1.5 (L) 1.7 - 2.4 mg/dL  Phosphorus     Status: None   Collection Time: 09/20/14  3:35 AM  Result Value Ref Range   Phosphorus 2.5 2.5 - 4.6 mg/dL  Hepatic function panel     Status: None   Collection Time: 09/20/14  3:35 AM  Result Value Ref Range   Total Protein 7.6 6.5 - 8.1 g/dL   Albumin 4.8 3.5 - 5.0 g/dL   AST 41 15 - 41 U/L   ALT 63 17 - 63 U/L   Alkaline Phosphatase 88 38 - 126 U/L   Total Bilirubin 1.0 0.3 - 1.2 mg/dL   Bilirubin, Direct 0.2 0.1 - 0.5 mg/dL   Indirect Bilirubin 0.8 0.3 - 0.9 mg/dL  CK     Status: Abnormal   Collection Time: 09/20/14  3:35 AM  Result Value Ref Range   Total CK 602 (H) 49 - 397 U/L  Basic metabolic panel     Status: Abnormal   Collection Time: 09/20/14 11:39 AM  Result Value Ref Range   Sodium 137 135 - 145 mmol/L   Potassium 3.8 3.5 - 5.1 mmol/L    Comment: RESULT REPEATED AND VERIFIED DELTA CHECK NOTED    Chloride 104 101 - 111 mmol/L   CO2 24 22 - 32 mmol/L   Glucose, Bld 87 65 - 99 mg/dL   BUN <5 (L) 6 - 20 mg/dL   Creatinine, Ser 0.63 0.61 - 1.24 mg/dL   Calcium 8.6 (L) 8.9 - 10.3 mg/dL   GFR calc non Af Amer >60 >60 mL/min   GFR calc Af Amer >60 >60 mL/min    Comment: (NOTE) The eGFR has been calculated  using the CKD EPI equation. This calculation has not been validated in all clinical situations. eGFR's persistently <60 mL/min signify possible Chronic Kidney Disease.    Anion gap 9 5 - 15  Magnesium     Status: None   Collection Time: 09/20/14 11:39 AM  Result Value Ref Range   Magnesium 2.4 1.7 - 2.4 mg/dL  Basic metabolic panel     Status: None   Collection Time: 09/21/14  5:15 AM  Result Value Ref Range   Sodium 138 135 - 145 mmol/L   Potassium 4.5 3.5 - 5.1 mmol/L    Comment: MODERATE HEMOLYSIS HEMOLYSIS AT THIS LEVEL MAY AFFECT RESULT    Chloride 103 101 - 111 mmol/L   CO2 24 22 - 32 mmol/L   Glucose, Bld 75 65 - 99 mg/dL   BUN 6 6 - 20 mg/dL   Creatinine, Ser 0.88 0.61 - 1.24 mg/dL   Calcium 9.1 8.9 - 10.3 mg/dL   GFR calc non Af Amer >60 >60 mL/min   GFR calc Af Amer >60 >60 mL/min    Comment: (NOTE) The eGFR has been calculated using the CKD EPI equation. This calculation has not been validated in all clinical situations. eGFR's persistently <60 mL/min signify possible Chronic Kidney Disease.    Anion gap 11 5 - 15    Vitals: Blood pressure 129/78,  pulse 78, temperature 98 F (36.7 C), temperature source Oral, resp. rate 20, height $RemoveBe'5\' 8"'aOTkpPuQf$  (1.727 m), weight 146.1 kg (322 lb 1.5 oz), SpO2 99 %.  Risk to Self: Is patient at risk for suicide?: Yes Risk to Others:   Prior Inpatient Therapy:   Prior Outpatient Therapy:    Current Facility-Administered Medications  Medication Dose Route Frequency Provider Last Rate Last Dose  . 0.9 %  sodium chloride infusion  250 mL Intravenous PRN Praveen Mannam, MD      . 0.9 %  sodium chloride infusion   Intravenous Continuous Praveen Mannam, MD 125 mL/hr at 09/21/14 1024    . acetaminophen (TYLENOL) tablet 650 mg  650 mg Oral Q6H PRN Wilhelmina Mcardle, MD   650 mg at 09/20/14 1857  . [START ON 09/22/2014] amLODipine (NORVASC) tablet 10 mg  10 mg Oral Daily Juanito Doom, MD      . folic acid (FOLVITE) tablet 1 mg  1 mg Oral  Daily Praveen Mannam, MD   1 mg at 09/21/14 1024  . heparin injection 5,000 Units  5,000 Units Subcutaneous 3 times per day Marshell Garfinkel, MD   5,000 Units at 09/21/14 1405  . hydrALAZINE (APRESOLINE) injection 5 mg  5 mg Intravenous Q4H PRN Anders Simmonds, MD   5 mg at 09/20/14 1950  . LORazepam (ATIVAN) injection 0.5-1 mg  0.5-1 mg Intravenous Q4H PRN Marshell Garfinkel, MD   1 mg at 09/21/14 0537  . LORazepam (ATIVAN) injection 2 mg  2 mg Intravenous Q6H Praveen Mannam, MD   2 mg at 09/21/14 1405  . oxyCODONE (Oxy IR/ROXICODONE) immediate release tablet 5 mg  5 mg Oral Q4H PRN Wilhelmina Mcardle, MD   5 mg at 09/21/14 0535  . thiamine (VITAMIN B-1) tablet 100 mg  100 mg Oral Daily Praveen Mannam, MD   100 mg at 09/21/14 1024    Musculoskeletal: Strength & Muscle Tone: some tremulousness noted  Gait & Station: gait not examined, patient in bed  Patient leans: N/A  Psychiatric Specialty Exam: Physical Exam  ROS  Blood pressure 129/78, pulse 78, temperature 98 F (36.7 C), temperature source Oral, resp. rate 20, height $RemoveBe'5\' 8"'UHjpqphOP$  (1.727 m), weight 146.1 kg (322 lb 1.5 oz), SpO2 99 %.Body mass index is 48.99 kg/(m^2).  General Appearance: Fairly Groomed- in hospital garb   Eye Contact::  Good  Speech:  Slow  Volume:  Decreased  Mood:  Anxious and Depressed  Affect:  Constricted and anxious   Thought Process:  Disorganized  Orientation:  Other:  oriented to person, partially to time - August , disoriented to place   Thought Content:  denies hallucinations, but looks around the room at times as if internally preoccupied   Suicidal Thoughts:  Yes.  without intent/plan- currently does not endorse ongoing or active SI  Homicidal Thoughts:  No  Memory:  recent and remote poor   Judgement:  Impaired  Insight:  Lacking  Psychomotor Activity:  Restlessness  Concentration:  Fair  Recall:  [employer] of Knowledge:Fair  Language: Fair  Akathisia:  Negative  Handed:  Right  AIMS (if indicated):      Assets:  Desire for Improvement Resilience  ADL's:  Impaired  Cognition:  Confused   Sleep:      Medical Decision Making: Review of Psycho-Social Stressors (1), Review or order clinical lab tests (1), Established Problem, Worsening (2) and Review of Medication Regimen & Side Effects (2)  Treatment Plan Summary: Patient presented  with symptoms of depression, anxiety, posttraumatic stress disorder, status post intentional drug overdose. Patient is currently stable medically but continued to be risk to his life as he has ongoing suicidal thoughts and has no Surveyor, mining. Patient reported he would like to be admitted to psychiatric hospital and his medication need to be adjusted for treating his depression and anxiety. Patient meet criteria for inpatient psychiatric hospitalization for crisis evaluation, safety monitoring on medication management of depression anxiety.   Recommendations:    1. Consider standing Ativan Detox Protocol, Thiamine, Folate .   1. Patient meets criteria for inpatient psychiatric admission once medically cleared . 3. Status post suicide attempt: Chief Technology Officer . 4. Recommends NO antidepressant or other psychiatric medication. 5. Appreciate psychiatric consultation and follow up as clinically required 6. Please contact [phone] or [phone] if needs further assistance  Plan:  Recommend psychiatric Inpatient admission when medically cleared.   Mitchel Delduca,JANARDHAHA R. 09/21/2014 2:08 PM

## 2014-09-21 NOTE — Care Management Note (Signed)
Case Management Note  Patient Details  Name: Miguel [name] MRN: [id number] Date of Birth: 08-Jan-1991  Subjective/Objective:            Admitted with OD        Action/Plan: Discharge planning per CSW  Expected Discharge Date:                  Expected Discharge Plan:  Psychiatric Hospital  In-House Referral:  Clinical Social Work  Discharge planning Services  CM Consult  Post Acute Care Choice:  NA Choice offered to:  NA  DME Arranged:  N/A DME Agency:  NA  HH Arranged:  NA HH Agency:  NA  Status of Service:  Completed, signed off  Medicare Important Message Given:    Date Medicare IM Given:    Medicare IM give by:    Date Additional Medicare IM Given:    Additional Medicare Important Message give by:     If discussed at Long Length of Stay Meetings, dates discussed:    Additional Comments:  Alexis Goodell, RN 09/21/2014, 10:55 AM
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
## 2014-09-21 NOTE — Plan of Care (Signed)
Problem: Ineffective individual coping Goal: LTG: Patient will report a decrease in negative feelings Outcome: Progressing Patient currently denies SI/HI.

## 2014-09-21 NOTE — Progress Notes (Signed)
Clinical Social Work  Per MD, patient is medically stable to DC. CSW spoke with [facility] Inetta Fermo) at [facility] who will review information. CSW spoke with admissions Dahlia Client) at [facility] who will also review. If neither facilities can accept then CSW will expand search.  [place], [place] [phone]

## 2014-09-21 NOTE — Progress Notes (Signed)
[facility] PCCM  S: Feeling better today O: Filed Vitals:   09/20/14 1400 09/20/14 2143 09/21/14 0522 09/21/14 0634  BP: 139/92 144/99 131/108 129/78  Pulse: 78     Temp: 98.6 F (37 C) 98.4 F (36.9 C) 98 F (36.7 C)   TempSrc: Oral Oral Oral   Resp: Height:      Weight:   322 lb 1.5 oz (146.1 kg)   SpO2: 97% 98% 99%    RA  Gen: well appearing HENT: OP clear,  neck supple PULM: CTA B, normal percussion CV: RRR, no mgr, trace edema GI: BS+, soft, nontender Derm: no cyanosis or rash Psyche: normal mood and affect  BMET    Component Value Date/Time   NA 138 09/21/2014 0515   K 4.5 09/21/2014 0515   CL 103 09/21/2014 0515   CO2 24 09/21/2014 0515   GLUCOSE 75 09/21/2014 0515   BUN 6 09/21/2014 0515   CREATININE 0.88 09/21/2014 0515   CALCIUM 9.1 09/21/2014 0515   GFRNONAA >60 09/21/2014 0515   GFRAA >60 09/21/2014 0515    Impression: Benadryl overdose> now resolved, no evidence of lasting medical damage based on labs, exam Hypokalemia> resolved Acute encephalopathy> resolved Hypertension> improved with amlodipine Suicide attempt> needs [facility] inpatient care, psyche following  Plan: Discharge to inpatient psyche facility Increase amlodipine to  daily, will need outpatient BP monitoring  Heber Inverness, MD [place] PCCM Pager: [phone] Cell: [phone] After 3pm or if no response, call [phone]

## 2014-09-21 NOTE — Progress Notes (Signed)
Miguel [name]  Is 24 y.o caucasian male admitted under the services of Dr Jennet Maduro for suicidal attempt. Respondent overdosed on multiple tablets of benadryl and was admitted in the ICU for this incidence. Patient has a hx of HTN, depression, PTSD and multiple  suicidal attempts. Patent's UDS was positive for cocaine and benzodiazepine, but he denies using this drug during interview.Upon admisson on the unit pt is alert and oriented x 4, affect is flat, sad, depressed, he appears anxious and apprehensive about his stay on the  unit. Patient's skin assessment was done with another nurse; skin warm, dry and intact, tattoo noted to left upper arm, also he was searched for contraband non found. Patient was offered support and encouragement, and also oriented to the unit. In addition patient was  encouraged to attend evening wrap up group. No acute distress noted, 15 minutes checks maintained, will continue to monitor.

## 2014-09-21 NOTE — Progress Notes (Signed)
Clinical Social Work  Patient accepted to [facility] by Dr. Jennet Maduro to room 307. RN to call report to [phone]. [facility] cannot accept patient until after 7pm. Patient in room with family and agreeable for them to be involved with assessment. CSW explained DC plans and patient agreeable to go voluntarily. [facility] arranged for 7pm pick up.  CSW is signing off but available if needed.  [place], [place] [phone]

## 2014-09-22 DIAGNOSIS — F1320 Sedative, hypnotic or anxiolytic dependence, uncomplicated: Secondary | ICD-10-CM | POA: Diagnosis present

## 2014-09-22 DIAGNOSIS — F1420 Cocaine dependence, uncomplicated: Secondary | ICD-10-CM | POA: Diagnosis present

## 2014-09-22 DIAGNOSIS — F332 Major depressive disorder, recurrent severe without psychotic features: Principal | ICD-10-CM

## 2014-09-22 DIAGNOSIS — F17200 Nicotine dependence, unspecified, uncomplicated: Secondary | ICD-10-CM | POA: Diagnosis present

## 2014-09-22 NOTE — H&P (Signed)
Psychiatric Admission Assessment Adult  Patient Identification: Miguel [name] MRN:  [id number] Date of Evaluation:  09/22/2014 Chief Complaint:  depression substance abuse Principal Diagnosis: Severe recurrent major depression without psychotic features Diagnosis:   Patient Active Problem List   Diagnosis Date Noted  . Cocaine use disorder, moderate, dependence [F14.20] 09/22/2014  . Tobacco use disorder [Z72.0] 09/22/2014  . 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 HTN (hypertension) [I10] 09/21/2014  . Suicide attempt [T14.91] 09/19/2014  . Nightmares [F51.5]   . PTSD (post-traumatic stress disorder) [F43.10] 06/11/2014  . Severe recurrent major depression without psychotic features [F33.2] 06/11/2014   History of Present Illness::   Identifying data. Miguel [name] is a 24 year old male with a history of depression and substance use.  Chief complaint. The patient to sleeping to state.  History of present illness. Per patient and his chart, Miguel [name] has been depressed for a while. It was exacerbating by the loss of his infant daughter in 2014. He was hospitalized at [facility] in May 2016 and started on Prozac. He did not continue in the community. He did not follow-up with a psychiatrist. He experiences respiratory relationship with his girlfriend with whom he has a child and who is now pregnant. Reportedly his girlfriend wants to separate. The patient has been increasingly depressed with poor sleep, decreased appetite, anhedonia, being of guilt and hopelessness worthlessness, poor energy and concentration, social isolation, crying spells that culminated in a suicide attempt by Benadryl overdose for which he had been admitted to CCU. It is not possible that Benadryl is one of substance of abuse in addition to cocaine, pain killers, and benzodiazepines. The patient is rather sleepy on an interview and unable or unwilling to talk about  his problems. He denies symptoms suggestive of bipolar mania. He denies heightened anxiety. He denies excessive drinking. He minimizes other substance  Past psychiatric history. One admission to [facility] in May, 2016. He denies other suicide attempts . Family psychiatric history. Unknown  .Social history. He lives with his mother. He is employed. He recently separated from his girlfriend was pregnant. They have 57-year-old child together as well. Total Time spent with patient: 1 hour  Past Medical History:  Past Medical History  Diagnosis Date  . Hand fracture, right 2008    no surgery required  . Hand fracture, left 2010  . Depression   . Schizophrenia   . Bipolar 1 disorder     Past Surgical History  Procedure Laterality Date  . Left hand fracture Left 2010    Dr Ninfa Linden  . Cyst excision     Family History:  Family History  Problem Relation Age of Onset  . Bipolar disorder Mother   . Schizophrenia Mother   . Schizophrenia Sister    Social History:  History  Alcohol Use No    Comment: He drank this time in effort to hurt self/ usually non drinker     History  Drug Use No    Social History   Social History  . Marital Status: Single    Spouse Name: N/A  . Number of Children: N/A  . Years of Education: N/A   Social History Main Topics  . Smoking status: Never Smoker   . Smokeless tobacco: Never Used  . Alcohol Use: No     Comment: He drank this time in effort to hurt self/ usually non drinker  . Drug Use: No  . Sexual Activity: Yes    Birth  Control/ Protection: Other-see comments     Comment: condom   Other Topics Concern  . None   Social History Narrative   Additional Social History:                          Musculoskeletal: Strength & Muscle Tone: within normal limits Gait & Station: normal Patient leans: N/A  Psychiatric Specialty Exam: I reviewed physical exam performed at [facility] and agree with the findings. Physical Exam   Nursing note and vitals reviewed.   Review of Systems  All other systems reviewed and are negative.   Blood pressure 132/84, pulse 80, temperature 98.6 F (37 C), temperature source Oral, resp. rate 20, height $RemoveBe'5\' 7"'qiYiZsYbM$  (1.702 m), weight 142.883 kg (315 lb).Body mass index is 49.32 kg/(m^2).  See SRA.                                                  Sleep:  Number of Hours: 7.5   Risk to Self: Is patient at risk for suicide?: Yes Risk to Others:   Prior Inpatient Therapy:   Prior Outpatient Therapy:    Alcohol Screening: 1. How often do you have a drink containing alcohol?: Never 2. How many drinks containing alcohol do you have on a typical day when you are drinking?: 1 or 2 3. How often do you have six or more drinks on one occasion?: Never Preliminary Score: 0 4. How often during the last year have you found that you were not able to stop drinking once you had started?: Never 5. How often during the last year have you failed to do what was normally expected from you becasue of drinking?: Never 6. How often during the last year have you needed a first drink in the morning to get yourself going after a heavy drinking session?: Never 7. How often during the last year have you had a feeling of guilt of remorse after drinking?: Never 8. How often during the last year have you been unable to remember what happened the night before because you had been drinking?: Never 9. Have you or someone else been injured as a result of your drinking?: No 10. Has a relative or friend or a doctor or another health worker been concerned about your drinking or suggested you cut down?: No Alcohol Use Disorder Identification Test Final Score (AUDIT): 0 Brief Intervention: AUDIT score less than 7 or less-screening does not suggest unhealthy drinking-brief intervention not indicated  Allergies:   Allergies  Allergen Reactions  . Tramadol Hives  . Vicodin [Hydrocodone-Acetaminophen]  Nausea Only   Lab Results:  Results for orders placed or performed during the hospital encounter of 09/19/14 (from the past 48 hour(s))  Basic metabolic panel     Status: None   Collection Time: 09/21/14  5:15 AM  Result Value Ref Range   Sodium 138 135 - 145 mmol/L   Potassium 4.5 3.5 - 5.1 mmol/L    Comment: MODERATE HEMOLYSIS HEMOLYSIS AT THIS LEVEL MAY AFFECT RESULT    Chloride 103 101 - 111 mmol/L   CO2 24 22 - 32 mmol/L   Glucose, Bld 75 65 - 99 mg/dL   BUN 6 6 - 20 mg/dL   Creatinine, Ser 0.88 0.61 - 1.24 mg/dL   Calcium 9.1 8.9 - 10.3 mg/dL  GFR calc non Af Amer >60 >60 mL/min   GFR calc Af Amer >60 >60 mL/min    Comment: (NOTE) The eGFR has been calculated using the CKD EPI equation. This calculation has not been validated in all clinical situations. eGFR's persistently <60 mL/min signify possible Chronic Kidney Disease.    Anion gap 11 5 - 15   Current Medications: Current Facility-Administered Medications  Medication Dose Route Frequency Provider Last Rate Last Dose  . acetaminophen (TYLENOL) tablet 650 mg  650 mg Oral Q6H PRN Hildred Priest, MD   650 mg at 09/22/14 0940  . alum & mag hydroxide-simeth (MAALOX/MYLANTA) 200-200-20 MG/5ML suspension 30 mL  30 mL Oral Q4H PRN Hildred Priest, MD      . amLODipine (NORVASC) tablet 10 mg  10 mg Oral Daily Hildred Priest, MD   10 mg at 09/22/14 1540  . FLUoxetine (PROZAC) capsule 20 mg  20 mg Oral Daily Clovis Fredrickson, MD   20 mg at 09/22/14 0938  . hydrALAZINE (APRESOLINE) tablet 25 mg  25 mg Oral TID PRN Hildred Priest, MD   25 mg at 09/21/14 2207  . ibuprofen (ADVIL,MOTRIN) tablet 600 mg  600 mg Oral Q6H PRN Hildred Priest, MD      . LORazepam (ATIVAN) tablet 2 mg  2 mg Oral Q8H PRN Hildred Priest, MD   2 mg at 09/21/14 2208  . magnesium hydroxide (MILK OF MAGNESIA) suspension 30 mL  30 mL Oral Daily PRN Hildred Priest, MD      . nicotine  (NICOTROL) 10 MG inhaler 1 continuous puffing  1 continuous puffing Inhalation PRN Hildred Priest, MD      . promethazine (PHENERGAN) tablet 25 mg  25 mg Oral Q8H PRN Hildred Priest, MD      . traZODone (DESYREL) tablet 100 mg  100 mg Oral QHS PRN Hildred Priest, MD   100 mg at 09/21/14 2208   PTA Medications: Prescriptions prior to admission  Medication Sig Dispense Refill Last Dose  . amLODipine (NORVASC) 10 MG tablet Take 1 tablet (10 mg total) by mouth daily. 30 tablet 0   . FLUoxetine (PROZAC) 20 MG capsule Take 1 capsule (20 mg total) by mouth daily. 30 capsule 0 Past Month at Unknown time  . folic acid (FOLVITE) 1 MG tablet Take 1 tablet (1 mg total) by mouth daily. 30 tablet 0   . thiamine 100 MG tablet Take 1 tablet (100 mg total) by mouth daily. 30 tablet 0     Previous Psychotropic Medications: Yes   Substance Abuse History in the last 12 months:  Yes.      Consequences of Substance Abuse: Negative  Results for orders placed or performed during the hospital encounter of 09/19/14 (from the past 72 hour(s))  Troponin I     Status: None   Collection Time: 09/19/14  2:56 PM  Result Value Ref Range   Troponin I <0.03 <0.031 ng/mL    Comment:        NO INDICATION OF MYOCARDIAL INJURY.   Troponin I     Status: None   Collection Time: 09/19/14  9:12 PM  Result Value Ref Range   Troponin I <0.03 <0.031 ng/mL    Comment:        NO INDICATION OF MYOCARDIAL INJURY.   Troponin I (q 6hr x 3)     Status: None   Collection Time: 09/20/14  3:35 AM  Result Value Ref Range   Troponin I <0.03 <0.031 ng/mL  Comment:        NO INDICATION OF MYOCARDIAL INJURY.   CBC     Status: Abnormal   Collection Time: 09/20/14  3:35 AM  Result Value Ref Range   WBC 14.5 (H) 4.0 - 10.5 K/uL   RBC 5.32 4.22 - 5.81 MIL/uL   Hemoglobin 15.9 13.0 - 17.0 g/dL   HCT 46.4 39.0 - 52.0 %   MCV 87.2 78.0 - 100.0 fL   MCH 29.9 26.0 - 34.0 pg   MCHC 34.3 30.0 -  36.0 g/dL   RDW 12.2 11.5 - 15.5 %   Platelets 245 150 - 400 K/uL  Basic metabolic panel     Status: Abnormal   Collection Time: 09/20/14  3:35 AM  Result Value Ref Range   Sodium 140 135 - 145 mmol/L   Potassium 2.9 (L) 3.5 - 5.1 mmol/L    Comment: DELTA CHECK NOTED REPEATED TO VERIFY    Chloride 104 101 - 111 mmol/L   CO2 25 22 - 32 mmol/L   Glucose, Bld 83 65 - 99 mg/dL   BUN <5 (L) 6 - 20 mg/dL   Creatinine, Ser 0.70 0.61 - 1.24 mg/dL   Calcium 8.6 (L) 8.9 - 10.3 mg/dL   GFR calc non Af Amer >60 >60 mL/min   GFR calc Af Amer >60 >60 mL/min    Comment: (NOTE) The eGFR has been calculated using the CKD EPI equation. This calculation has not been validated in all clinical situations. eGFR's persistently <60 mL/min signify possible Chronic Kidney Disease.    Anion gap 11 5 - 15  Magnesium     Status: Abnormal   Collection Time: 09/20/14  3:35 AM  Result Value Ref Range   Magnesium 1.5 (L) 1.7 - 2.4 mg/dL  Phosphorus     Status: None   Collection Time: 09/20/14  3:35 AM  Result Value Ref Range   Phosphorus 2.5 2.5 - 4.6 mg/dL  Hepatic function panel     Status: None   Collection Time: 09/20/14  3:35 AM  Result Value Ref Range   Total Protein 7.6 6.5 - 8.1 g/dL   Albumin 4.8 3.5 - 5.0 g/dL   AST 41 15 - 41 U/L   ALT 63 17 - 63 U/L   Alkaline Phosphatase 88 38 - 126 U/L   Total Bilirubin 1.0 0.3 - 1.2 mg/dL   Bilirubin, Direct 0.2 0.1 - 0.5 mg/dL   Indirect Bilirubin 0.8 0.3 - 0.9 mg/dL  CK     Status: Abnormal   Collection Time: 09/20/14  3:35 AM  Result Value Ref Range   Total CK 602 (H) 49 - 397 U/L  Basic metabolic panel     Status: Abnormal   Collection Time: 09/20/14 11:39 AM  Result Value Ref Range   Sodium 137 135 - 145 mmol/L   Potassium 3.8 3.5 - 5.1 mmol/L    Comment: RESULT REPEATED AND VERIFIED DELTA CHECK NOTED    Chloride 104 101 - 111 mmol/L   CO2 24 22 - 32 mmol/L   Glucose, Bld 87 65 - 99 mg/dL   BUN <5 (L) 6 - 20 mg/dL   Creatinine, Ser  0.63 0.61 - 1.24 mg/dL   Calcium 8.6 (L) 8.9 - 10.3 mg/dL   GFR calc non Af Amer >60 >60 mL/min   GFR calc Af Amer >60 >60 mL/min    Comment: (NOTE) The eGFR has been calculated using the CKD EPI equation. This calculation has not been validated in  all clinical situations. eGFR's persistently <60 mL/min signify possible Chronic Kidney Disease.    Anion gap 9 5 - 15  Magnesium     Status: None   Collection Time: 09/20/14 11:39 AM  Result Value Ref Range   Magnesium 2.4 1.7 - 2.4 mg/dL  Basic metabolic panel     Status: None   Collection Time: 09/21/14  5:15 AM  Result Value Ref Range   Sodium 138 135 - 145 mmol/L   Potassium 4.5 3.5 - 5.1 mmol/L    Comment: MODERATE HEMOLYSIS HEMOLYSIS AT THIS LEVEL MAY AFFECT RESULT    Chloride 103 101 - 111 mmol/L   CO2 24 22 - 32 mmol/L   Glucose, Bld 75 65 - 99 mg/dL   BUN 6 6 - 20 mg/dL   Creatinine, Ser 0.88 0.61 - 1.24 mg/dL   Calcium 9.1 8.9 - 10.3 mg/dL   GFR calc non Af Amer >60 >60 mL/min   GFR calc Af Amer >60 >60 mL/min    Comment: (NOTE) The eGFR has been calculated using the CKD EPI equation. This calculation has not been validated in all clinical situations. eGFR's persistently <60 mL/min signify possible Chronic Kidney Disease.    Anion gap 11 5 - 15    Observation Level/Precautions:  15 minute checks  Laboratory:  CBC Chemistry Profile UDS UA  Psychotherapy:    Medications:    Consultations:    Discharge Concerns:    Estimated LOS:  Other:     Psychological Evaluations: No   Treatment Plan Summary: Daily contact with patient to assess and evaluate symptoms and progress in treatment and Medication management  Medical Decision Making:  New problem, with additional work up planned, Review of Psycho-Social Stressors (1), Independent Review of image, tracing or specimen (2) and Review of Medication Regimen & Side Effects (2)   Miguel [name] is a 24 year old male with a history of depression in substance abuse  admitted after a suicide attempt by Benadryl overdose in the context of treatment noncompliance.  1. Suicidal ideation. The patient is able to contract for safety in the hospital.  2. Mood. He was restarted on Prozac for depression.  3. Hypertension. We will continue Norvasc.  4. Smoking. Nicotine products are available.  5. Substance abuse treatment. The patient is positive for cocaine and benzodiazepine. In the past he was positive for pain killers as well. He minimizes his problems and declines residential treatment.  6. Disposition. He will be discharged to home with his parents. He will follow up with [facility].   I certify that inpatient services furnished can reasonably be expected to improve the patient's condition.   Jolanta Pucilowska 8/24/201612:23 PM

## 2014-09-22 NOTE — [facility] Group Notes (Signed)
[facility] Group Notes:  (Nursing/MHT/Case Management/Adjunct)  Date:  09/22/2014  Time:  11:37 AM  Type of Therapy:  Psychoeducational Skills  Participation Level:  Did Not Attend   Aileen Amore Lea Campbell 09/22/2014, 11:37 AM 

## 2014-09-22 NOTE — Plan of Care (Signed)
Problem: Diagnosis: Increased Risk For Suicide Attempt Goal: LTG-Patient Will Report Improved Mood and Deny Suicidal LTG (by discharge) Patient will report improved mood and deny suicidal ideation.  Outcome: Progressing Patient denies suicidal ideations.     

## 2014-09-22 NOTE — Progress Notes (Signed)
Recreation Therapy Notes  INPATIENT RECREATION THERAPY ASSESSMENT  Patient Details Name: Olliver Boyadjian MRN: [id number] DOB: [birth year] Today's Date: 09/22/2014  Patient Stressors:  Patient reported no stressors.  Coping Skills:   Isolate, Arguments, Avoidance, Exercise, Art/Dance, Music, Sports  Personal Challenges: Anger, Concentration, Expressing Yourself, Stress Management, Substance Abuse  Leisure Interests (2+):  Individual - Other (Comment) (Play Lacrosse, watch football)  Awareness of Community Resources:  Yes  Community Resources:  Park  Current Use: Yes  If no, Barriers?:    Patient Strengths:  Caring, loving  Patient Identified Areas of Improvement:  No  Current Recreation Participation:  Riding around  Patient Goal for Hospitalization:  To get out  [place] of Residence:  [place] of Residence:  [place]   Current [place] (including self-harm):  No  Current HI:  No  Consent to Intern Participation: N/A   Jacquelynn Cree, LRT/CTRS 09/22/2014, 2:52 PM
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
## 2014-09-22 NOTE — Progress Notes (Signed)
Recreation Therapy Notes  Date: 08.24.16 Time: 3:00 pm Location: Craft Room  Group Topic: Self-esteem  Goal Area(s) Addresses:  Patient will write down at least one positive trait about self. Patient will verbalize importance of self-esteem  Behavioral Response: Did not attend  Intervention: I Am  Activity: Patients were given a worksheet with the letter I and instructed to fill it with as many positive traits about themselves as they could.  Education: LRT educated patients on ways to increase their self-esteem.   Education Outcome: Patient did not attend group.  Clinical Observations/Feedback: Patient did not attend group.  Abbye Lao M, LRT/CTRS 09/22/2014 4:58 PM 

## 2014-09-22 NOTE — [facility] Suicide Risk Assessment (Signed)
[facility] Admission Suicide Risk Assessment   Nursing information obtained from:  Patient Demographic factors:  Male, Caucasian, Low socioeconomic status, Unemployed, Adolescent or young adult Current Mental Status:  NA Loss Factors:  Loss of significant relationship Historical Factors:  Prior suicide attempts, Anniversary of important loss Risk Reduction Factors:  Responsible for children under 25 years of age Total Time spent with patient: 1 hour Principal Problem: Severe recurrent major depression without psychotic features Diagnosis:   Patient Active Problem List   Diagnosis Date Noted  . HTN (hypertension) [I10] 09/21/2014  . Suicide attempt [T14.91] 09/19/2014  . Nightmares [F51.5]   . PTSD (post-traumatic stress disorder) [F43.10] 06/11/2014  . Severe recurrent major depression without psychotic features [F33.2] 06/11/2014     Continued Clinical Symptoms:  Alcohol Use Disorder Identification Test Final Score (AUDIT): 0 The "Alcohol Use Disorders Identification Test", Guidelines for Use in Primary Care, Second Edition.  World Science writer [facility]). Score between 0-7:  no or low risk or alcohol related problems. Score between 8-15:  moderate risk of alcohol related problems. Score between 16-19:  high risk of alcohol related problems. Score 20 or above:  warrants further diagnostic evaluation for alcohol dependence and treatment.   CLINICAL FACTORS:   Depression:   Comorbid alcohol abuse/dependence Severe   Musculoskeletal: Strength & Muscle Tone: within normal limits Gait & Station: normal Patient leans: N/A  Psychiatric Specialty Exam: Physical Exam  Nursing note and vitals reviewed.   Review of Systems  Neurological: Positive for dizziness.  All other systems reviewed and are negative.   Blood pressure 132/84, pulse 80, temperature 98.6 F (37 C), temperature source Oral, resp. rate 20, height  (1.702 m), weight 142.883 kg (315 lb).Body mass index is 49.32  kg/(m^2).  General Appearance: Disheveled  Eye Contact::  Poor  Speech:  Slow and Slurred  Volume:  Decreased  Mood:  Depressed  Affect:  Flat  Thought Process:  Linear  Orientation:  Full (Time, Place, and Person)  Thought Content:  WDL  Suicidal Thoughts:  Yes.  with intent/plan  Homicidal Thoughts:  No  Memory:  Immediate;   Poor Recent;   Poor Remote;   Poor  Judgement:  Impaired  Insight:  Shallow  Psychomotor Activity:  Decreased  Concentration:  Fair  Recall:  [employer] of Knowledge:Fair  Language: Fair  Akathisia:  No  Handed:  Right  AIMS (if indicated):     Assets:  Communication Skills Desire for Improvement  Sleep:  Number of Hours: 7.5  Cognition: WNL  ADL's:  Intact     COGNITIVE FEATURES THAT CONTRIBUTE TO RISK:  None    SUICIDE RISK:   Moderate:  Frequent suicidal ideation with limited intensity, and duration, some specificity in terms of plans, no associated intent, good self-control, limited dysphoria/symptomatology, some risk factors present, and identifiable protective factors, including available and accessible social support.  PLAN OF CARE: hospital admission, medication management, discharge planning.  Medical Decision Making:  New problem, with additional work up planned, Review of Psycho-Social Stressors (1), Review of Medication Regimen & Side Effects (2) and Review of New Medication or Change in Dosage (2)   Miguel [name] is a 24 year old male with a history of depression in substance abuse admitted after a suicide attempt by Benadryl overdose in the context of treatment noncompliance.  1. Suicidal ideation. The patient is able to contract for safety in the hospital.  2. Mood. He was restarted on Prozac for depression.  3. Hypertension. We will continue  Norvasc.  4. Smoking. Nicotine products are available.  5. Substance abuse treatment. The patient is positive for cocaine and benzodiazepine. In the past he was positive for pain killers  as well. He minimizes his problems and declines residential treatment.  6. Disposition. He will be discharged to home with his parents. He will follow up with [facility].  I certify that inpatient services furnished can reasonably be expected to improve the patient's condition.   Miguel [name] 09/22/2014, 12:14 PM

## 2014-09-22 NOTE — Progress Notes (Signed)
D: Patient stated slept good last night .Stated appetite is good and energy level  Is normal. Stated concentration is good . Stated on Depression scale1 , hopeless 0 and anxiety 0 . ( low 0-10 high) Denies suicidal  homicidal ideations  .  No auditory hallucinations  No pain concerns . Appropriate ADL'S. Interacting with peers and staff.  A: Encourage patient participation with unit programming . Instruction  Given on  Medication , verbalize understanding. R: Voice no other concerns. Staff continue to monitor

## 2014-09-23 MED ORDER — FLUOXETINE HCL 20 MG PO CAPS: 20.0000 mg | ORAL_CAPSULE | Freq: Every day | ORAL | Status: DC

## 2014-09-23 MED ORDER — AMLODIPINE BESYLATE 10 MG PO TABS: 10.0000 mg | ORAL_TABLET | Freq: Every day | ORAL | Status: DC

## 2014-09-23 MED ORDER — TRAZODONE HCL 100 MG PO TABS: 100.0000 mg | ORAL_TABLET | Freq: Every evening | ORAL | Status: DC | PRN

## 2014-09-23 NOTE — Progress Notes (Signed)
  [facility] :  Will you be returning to the same living situation after discharge:  Yes,  Home  At discharge, do you have transportation home?: Yes,  Bus  Do you have the ability to pay for your medications: Yes,  Mental Health   Release of information consent forms completed and in the chart;  Patient's signature needed at discharge.  Patient to Follow up at: Follow-up Information    Follow up with [facility] of the [place] . Go in 1 week.   Why:  For your hospital follow up please go to the walk in clinic. Monday - Friday 8:30am-12:30pm and 1:00pm-2:30pm. Please take all of your hospital paperwork.    Contact information:   [address]  [place], [place] [address] Phone: [phone]      Patient denies SI/HI: Yes,  Yes    Safety Planning and Suicide Prevention discussed: Yes,  With mother and patient   Have you used any form of tobacco in the last 30 days? (Cigarettes, Smokeless Tobacco, Cigars, and/or Pipes): No  Has patient been referred to the Quitline?: N/A patient is not a smoker  [employer] MSW, LCSWA  09/23/2014, 2:48 PM

## 2014-09-23 NOTE — [facility] Suicide Risk Assessment (Signed)
[facility] INPATIENT:  Family/Significant Other Suicide Prevention Education  Suicide Prevention Education:  Education Completed; Nichola Warren (mom) [phone] has been identified by the patient as the family member/significant other with whom the patient will be residing, and identified as the person(s) who will aid the patient in the event of a mental health crisis (suicidal ideations/suicide attempt).  With written consent from the patient, the family member/significant other has been provided the following suicide prevention education, prior to the and/or following the discharge of the patient.  The suicide prevention education provided includes the following:  Suicide risk factors  Suicide prevention and interventions  National Suicide Hotline telephone number  [facility] assessment telephone number  [facility] Emergency Assistance 911  [facility] and/or Residential Mobile Crisis Unit telephone number  Request made of family/significant other to:  Remove weapons (e.g., guns, rifles, knives), all items previously/currently identified as safety concern.    Remove drugs/medications (over-the-counter, prescriptions, illicit drugs), all items previously/currently identified as a safety concern.  The family member/significant other verbalizes understanding of the suicide prevention education information provided.  The family member/significant other agrees to remove the items of safety concern listed above.   [employer] MSW, LCSWA  09/23/2014, 2:14 PM

## 2014-09-23 NOTE — Progress Notes (Signed)
Recreation Therapy Notes  Date: 08.25.16 Time: 3:00 pm Location: Craft Room  Group Topic: Leisure Education  Goal Area(s) Addresses:  Patient will identify things they are grateful for. Patient will identify how being grateful can influence decision making.  Behavioral Response: Did not attend  Intervention: Grateful Wheel  Activity: Patients were given a I Am Grateful For worksheet and instructed to list specific things they are grateful for under each category.   Education: LRT educated patients on why it is important to be grateful.  Education Outcome: Patient did not attend group.  Clinical Observations/Feedback: Patient did not attend group.  Jacquelynn Cree, LRT/CTRS 09/23/2014 4:09 PM

## 2014-09-23 NOTE — Progress Notes (Signed)
D: Patient denies SI/HI/AVH.  Patient affect and mood are anxious.  Patient did attend evening group. Patient visible on the milieu. No distress noted. A: Support and encouragement offered. Scheduled medications given to pt. Q 15 min checks continued for patient safety. R: Patient receptive. Patient remains safe on the unit.   

## 2014-09-23 NOTE — B[facility] Group Notes (Signed)
[facility] Group Notes:  (Nursing/MHT/Case Management/Adjunct)  Date:  09/23/2014  Time:  8:40 AM  Type of Therapy:  Group Therapy  Participation Level:  Did Not Attend  Summary of Progress/Problems:  Jacquis Paxton De'Chelle Tytianna Greenley 09/23/2014, 8:40 AM 

## 2014-09-23 NOTE — Discharge Summary (Signed)
Physician Discharge Summary Note  Patient:  Miguel [name] is an 24 y.o., male MRN:  [id number] DOB:  01/18/1991 Patient phone:  [phone] (home)  Patient address:   [place] [address],  Total Time spent with patient: 30 minutes  Date of Admission:  09/21/2014 Date of Discharge: 09/23/2014  Reason for Admission:  Suicide attempt by overdose.  Identifying data. Mr. Miguel [name] is a 24 year old male with a history of depression and substance use.  Chief complaint. The patient to sleeping to state.  History of present illness. Per patient and his chart, Mr. Miguel [name] has been depressed for a while. It was exacerbating by the loss of his infant daughter in 2014. He was hospitalized at [facility] in May 2016 and started on Prozac. He did not continue in the community. He did not follow-up with a psychiatrist. He experiences respiratory relationship with his girlfriend with whom he has a child and who is now pregnant. Reportedly his girlfriend wants to separate. The patient has been increasingly depressed with poor sleep, decreased appetite, anhedonia, being of guilt and hopelessness worthlessness, poor energy and concentration, social isolation, crying spells that culminated in a suicide attempt by Benadryl overdose for which he had been admitted to CCU. It is not possible that Benadryl is one of substance of abuse in addition to cocaine, pain killers, and benzodiazepines. The patient is rather sleepy on an interview and unable or unwilling to talk about his problems. He denies symptoms suggestive of bipolar mania. He denies heightened anxiety. He denies excessive drinking. He minimizes other substance  Past psychiatric history. One admission to [facility] in May, 2016. He denies other suicide attempts . Family psychiatric history. Unknown  .Social history. He lives with his mother. He is employed. He recently separated from his girlfriend was pregnant. They have 1-year-old  child together as well.  Principal Problem: Severe recurrent major depression without psychotic features Discharge Diagnoses: Patient Active Problem List   Diagnosis Date Noted  . Cocaine use disorder, moderate, dependence [F14.20] 09/22/2014  . Tobacco use disorder [Z72.0] 09/22/2014  . 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 HTN (hypertension) [I10] 09/21/2014  . Suicide attempt [T14.91] 09/19/2014  . Nightmares [F51.5]   . PTSD (post-traumatic stress disorder) [F43.10] 06/11/2014  . Severe recurrent major depression without psychotic features [F33.2] 06/11/2014    Musculoskeletal: Strength & Muscle Tone: within normal limits Gait & Station: normal Patient leans: N/A  Psychiatric Specialty Exam: Physical Exam  Nursing note and vitals reviewed.   Review of Systems  All other systems reviewed and are negative.   Blood pressure 125/81, pulse 88, temperature 98.2 F (36.8 C), temperature source Oral, resp. rate 20, height $RemoveBe'5\' 7"'OWRcUxvdc$  (1.702 m), weight 142.883 kg (315 lb).Body mass index is 49.32 kg/(m^2).  See SRA.                                                  Sleep:  Number of Hours: 7.5   Have you used any form of tobacco in the last 30 days? (Cigarettes, Smokeless Tobacco, Cigars, and/or Pipes): No  Has this patient used any form of tobacco in the last 30 days? (Cigarettes, Smokeless Tobacco, Cigars, and/or Pipes) No  Past Medical History:  Past Medical History  Diagnosis Date  . Hand fracture, right 2008  no surgery required  . Hand fracture, left 2010  . Depression   . Schizophrenia   . Bipolar 1 disorder     Past Surgical History  Procedure Laterality Date  . Left hand fracture Left 2010    Dr Ninfa Linden  . Cyst excision     Family History:  Family History  Problem Relation Age of Onset  . Bipolar disorder Mother   . Schizophrenia Mother   . Schizophrenia Sister    Social History:   History  Alcohol Use No    Comment: He drank this time in effort to hurt self/ usually non drinker     History  Drug Use No    Social History   Social History  . Marital Status: Single    Spouse Name: N/A  . Number of Children: N/A  . Years of Education: N/A   Social History Main Topics  . Smoking status: Never Smoker   . Smokeless tobacco: Never Used  . Alcohol Use: No     Comment: He drank this time in effort to hurt self/ usually non drinker  . Drug Use: No  . Sexual Activity: Yes    Birth Control/ Protection: Other-see comments     Comment: condom   Other Topics Concern  . None   Social History Narrative    Past Psychiatric History: Hospitalizations:  Outpatient Care:  Substance Abuse Care:  Self-Mutilation:  Suicidal Attempts:  Violent Behaviors:   Risk to Self: Is patient at risk for suicide?: Yes Risk to Others:   Prior Inpatient Therapy:   Prior Outpatient Therapy:    Level of Care:  OP  Hospital Course:    Mr. Miguel [name] is a 24 year old male with a history of depression and substance abuse admitted after suicide attempt by Benadryl overdose in the context of treatment noncompliance and severe social stressors.  1. Suicidal ideation. This has resolved. The patient is able to contract for safety.  2. Mood. He was restarted on Prozac for depression.  3. Hypertension. We continued Norvasc.  4. Smoking. Nicotine products were available.  5. Substance abuse treatment. The patient is positive for cocaine and benzodiazepine. In the past he was positive for pain killers as well. He minimizes his problems and declines residential treatment.  6. Disposition. He was discharged to home with his parents. He will follow up with [facility].   Consults:  None  Significant Diagnostic Studies:  None  Discharge Vitals:   Blood pressure 125/81, pulse 88, temperature 98.2 F (36.8 C), temperature source Oral, resp. rate 20, height $RemoveBe'5\' 7"'GnsHhVgux$  (1.702 m), weight 142.883  kg (315 lb). Body mass index is 49.32 kg/(m^2). Lab Results:   Results for orders placed or performed during the hospital encounter of 09/19/14 (from the past 72 hour(s))  Basic metabolic panel     Status: Abnormal   Collection Time: 09/20/14 11:39 AM  Result Value Ref Range   Sodium 137 135 - 145 mmol/L   Potassium 3.8 3.5 - 5.1 mmol/L    Comment: RESULT REPEATED AND VERIFIED DELTA CHECK NOTED    Chloride 104 101 - 111 mmol/L   CO2 24 22 - 32 mmol/L   Glucose, Bld 87 65 - 99 mg/dL   BUN <5 (L) 6 - 20 mg/dL   Creatinine, Ser 0.63 0.61 - 1.24 mg/dL   Calcium 8.6 (L) 8.9 - 10.3 mg/dL   GFR calc non Af Amer >60 >60 mL/min   GFR calc Af Amer >60 >60 mL/min    Comment: (  NOTE) The eGFR has been calculated using the CKD EPI equation. This calculation has not been validated in all clinical situations. eGFR's persistently <60 mL/min signify possible Chronic Kidney Disease.    Anion gap 9 5 - 15  Magnesium     Status: None   Collection Time: 09/20/14 11:39 AM  Result Value Ref Range   Magnesium 2.4 1.7 - 2.4 mg/dL  Basic metabolic panel     Status: None   Collection Time: 09/21/14  5:15 AM  Result Value Ref Range   Sodium 138 135 - 145 mmol/L   Potassium 4.5 3.5 - 5.1 mmol/L    Comment: MODERATE HEMOLYSIS HEMOLYSIS AT THIS LEVEL MAY AFFECT RESULT    Chloride 103 101 - 111 mmol/L   CO2 24 22 - 32 mmol/L   Glucose, Bld 75 65 - 99 mg/dL   BUN 6 6 - 20 mg/dL   Creatinine, Ser 0.88 0.61 - 1.24 mg/dL   Calcium 9.1 8.9 - 10.3 mg/dL   GFR calc non Af Amer >60 >60 mL/min   GFR calc Af Amer >60 >60 mL/min    Comment: (NOTE) The eGFR has been calculated using the CKD EPI equation. This calculation has not been validated in all clinical situations. eGFR's persistently <60 mL/min signify possible Chronic Kidney Disease.    Anion gap 11 5 - 15    Physical Findings: AIMS: Facial and Oral Movements Muscles of Facial Expression: None, normal Lips and Perioral Area: None, normal Jaw:  None, normal Tongue: None, normal,Extremity Movements Upper (arms, wrists, hands, fingers): None, normal Lower (legs, knees, ankles, toes): None, normal, Trunk Movements Neck, shoulders, hips: None, normal, Overall Severity Severity of abnormal movements (highest score from questions above): None, normal Incapacitation due to abnormal movements: None, normal Patient's awareness of abnormal movements (rate only patient's report): No Awareness, Dental Status Current problems with teeth and/or dentures?: No Does patient usually wear dentures?: No  CIWA:  CIWA-Ar Total: 1 COWS:  COWS Total Score: 2   See Psychiatric Specialty Exam and Suicide Risk Assessment completed by Attending Physician prior to discharge.  Discharge destination:  Home  Is patient on multiple antipsychotic therapies at discharge:  No   Has Patient had three or more failed trials of antipsychotic monotherapy by history:  No    Recommended Plan for Multiple Antipsychotic Therapies: NA     Medication List    STOP taking these medications        folic acid 1 MG tablet  Commonly known as:  FOLVITE     thiamine 100 MG tablet      TAKE these medications      Indication   amLODipine 10 MG tablet  Commonly known as:  NORVASC  Take 1 tablet (10 mg total) by mouth daily.   Indication:  High Blood Pressure     FLUoxetine 20 MG capsule  Commonly known as:  PROZAC  Take 1 capsule (20 mg total) by mouth daily.   Indication:  Depression     traZODone 100 MG tablet  Commonly known as:  DESYREL  Take 1 tablet (100 mg total) by mouth at bedtime as needed for sleep.   Indication:  Trouble Sleeping         Follow-up recommendations:  Activity:  As tolerated. Diet:  Regular. Other:  Keep follow-up appointments.  Comments:    Total Discharge Time: 35 min.  Signed: Orson Slick 09/23/2014, 10:38 AM

## 2014-09-23 NOTE — Tx Team (Signed)
Interdisciplinary Treatment Plan Update (Adult)  Date:  09/23/2014 Time Reviewed:  3:06 PM  Progress in Treatment: Attending groups: No. Participating in groups:  No. Taking medication as prescribed:  Yes. Tolerating medication:  Yes. Family/Significant othe contact made:  Yes, individual(s) contacted:  [name]  Patient understands diagnosis:  Yes. Discussing patient identified problems/goals with staff:  Yes. Medical problems stabilized or resolved:  Yes. Denies suicidal/homicidal ideation: Yes. Issues/concerns per patient self-inventory:  No. Other:  New problem(s) identified: No, Describe:  none  Discharge Plan or Barriers: Pt plans to return home and follow up with outpatient   Reason for Continuation of Hospitalization: Depression Medication stabilization Suicidal ideation  Comments: Mr. Cantara has been depressed for a while. It was exacerbating by the loss of his infant daughter in 2014. He was hospitalized at [facility] in May 2016 and started on Prozac. He did not continue in the community. He did not follow-up with a psychiatrist. He experiences respiratory relationship with his girlfriend with whom he has a child and who is now pregnant. Reportedly his girlfriend wants to separate. The patient has been increasingly depressed with poor sleep, decreased appetite, anhedonia, being of guilt and hopelessness worthlessness, poor energy and concentration, social isolation, crying spells that culminated in a suicide attempt by Benadryl overdose for which he had been admitted to CCU. It is not possible that Benadryl is one of substance of abuse in addition to cocaine, pain killers, and benzodiazepines. The patient is rather sleepy on an interview and unable or unwilling to talk about his problems. He denies symptoms suggestive of bipolar mania. He denies heightened anxiety. He denies excessive drinking. He minimizes other substance  Estimated length of stay: Pt is discharging  today.   New goal(s): NA  Review of initial/current patient goals per problem list:    1.  Goal(s): Patient will participate in aftercare plan * Met: 09/23/2014  * Target date: at discharge * As evidenced by: Patient will participate within aftercare plan AEB aftercare provider and housing plan at discharge being identified.   2.  Goal (s): Patient will exhibit decreased depressive symptoms and suicidal ideations. * Met: 09/23/2014  *  Target date: at discharge * As evidenced by: Patient will utilize self rating of depression at 3 or below and demonstrate decreased signs of depression or be deemed stable for discharge by MD.  Attendees: Patient:  Miguel [name] 8/25/20163:06 PM  Family:   8/25/20163:06 PM  Physician:  Dr. Bary Leriche 8/25/20163:06 PM  Nursing:   Floyde Parkins, RN  8/25/20163:06 PM  Clinical Social Worker:  [place] 8/25/20163:06 PM  Counselor:   8/25/20163:06 PM  Other:  Everitt Amber, Rosine 8/25/20163:06 PM  Other:   8/25/20163:06 PM  Other:   8/25/20163:06 PM  Other:  8/25/20163:06 PM  Other:  8/25/20163:06 PM  Other:  8/25/20163:06 PM  Other:  8/25/20163:06 PM  Other:  8/25/20163:06 PM  Other:  8/25/20163:06 PM  Other:   8/25/20163:06 PM   Scribe for Treatment Team:   Wray Kearns, MSW, [place]  09/23/2014, 3:06 PM

## 2014-09-23 NOTE — Plan of Care (Signed)
Problem: [facility] Participation in Recreation Therapeutic Interventions Goal: STG-Other Recreation Therapy Goal (Specify) STG: Stress Management - Within 3 treatment sessions, patient will verbalize understanding of the stress management techniques in one treatment session to increase stress management skills post d/c.  Outcome: Completed/Met Date Met:  09/23/14 Treatment Session 1; Completed 1 out of 1: At approximately 9:40 am, LRT met with patient in patient room. LRT educated and provided patient with handouts on stress management techniques. Patient verbalized understanding. LRT encouraged patient to read over and practice the stress management techniques.  [facility], LRT/CTRS 08.25.16 12:16 pm

## 2014-09-23 NOTE — Progress Notes (Signed)
Pt discharged home. DC instructions provided and explained. Medications reviewed. Rx given. All questions answered. Denies SI, HI, AVH. Pt stable at discharge 

## 2014-09-23 NOTE — [facility] Suicide Risk Assessment (Signed)
[facility] Discharge Suicide Risk Assessment   Demographic Factors:  Male, Adolescent or young adult and Caucasian  Total Time spent with patient: 30 minutes  Musculoskeletal: Strength & Muscle Tone: within normal limits Gait & Station: normal Patient leans: N/A  Psychiatric Specialty Exam: Physical Exam  Nursing note and vitals reviewed.   Review of Systems  All other systems reviewed and are negative.   Blood pressure 125/81, pulse 88, temperature 98.2 F (36.8 C), temperature source Oral, resp. rate 20, height  (1.702 m), weight 142.883 kg (315 lb).Body mass index is 49.32 kg/(m^2).  General Appearance: Casual  Eye Contact::  Good  Speech:  Clear and Coherent409  Volume:  Normal  Mood:  Euthymic  Affect:  Appropriate  Thought Process:  Goal Directed  Orientation:  Full (Time, Place, and Person)  Thought Content:  WDL  Suicidal Thoughts:  No  Homicidal Thoughts:  No  Memory:  Immediate;   Fair Recent;   Fair Remote;   Fair  Judgement:  Fair  Insight:  Fair  Psychomotor Activity:  Normal  Concentration:  Fair  Recall:  [employer] of Knowledge:Fair  Language: Fair  Akathisia:  No  Handed:  Right  AIMS (if indicated):     Assets:  Communication Skills Desire for Improvement Housing Physical Health Social Support  Sleep:  Number of Hours: 7.5  Cognition: WNL  ADL's:  Intact   Have you used any form of tobacco in the last 30 days? (Cigarettes, Smokeless Tobacco, Cigars, and/or Pipes): No  Has this patient used any form of tobacco in the last 30 days? (Cigarettes, Smokeless Tobacco, Cigars, and/or Pipes) No  Mental Status Per Nursing Assessment::   On Admission:  NA  Current Mental Status by Physician: NA  Loss Factors: Loss of significant relationship  Historical Factors: Impulsivity  Risk Reduction Factors:   Responsible for children under 3 years of age, Sense of responsibility to family, Employed, Living with another person, especially a relative  and Positive social support  Continued Clinical Symptoms:  Depression:   Comorbid alcohol abuse/dependence Insomnia Severe Alcohol/Substance Abuse/Dependencies  Cognitive Features That Contribute To Risk:  None    Suicide Risk:  Minimal: No identifiable suicidal ideation.  Patients presenting with no risk factors but with morbid ruminations; may be classified as minimal risk based on the severity of the depressive symptoms  Principal Problem: Severe recurrent major depression without psychotic features Discharge Diagnoses:  Patient Active Problem List   Diagnosis Date Noted  . Cocaine use disorder, moderate, dependence [F14.20] 09/22/2014  . Tobacco use disorder [Z72.0] 09/22/2014  . 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 HTN (hypertension) [I10] 09/21/2014  . Suicide attempt [T14.91] 09/19/2014  . Nightmares [F51.5]   . PTSD (post-traumatic stress disorder) [F43.10] 06/11/2014  . Severe recurrent major depression without psychotic features [F33.2] 06/11/2014      Plan Of Care/Follow-up recommendations:  Activity:  as tolerated. Diet:  regular. Other:  keep follow up appointments.  Is patient on multiple antipsychotic therapies at discharge:  No   Has Patient had three or more failed trials of antipsychotic monotherapy by history:  No  Recommended Plan for Multiple Antipsychotic Therapies: NA    Cherica Heiden 09/23/2014, 10:35 AM

## 2014-09-24 NOTE — Progress Notes (Signed)
AVS H&P Discharge Summary faxed to [facility] the [facility] for hospital follow-up

## 2014-09-24 NOTE — Progress Notes (Signed)
Recreation Therapy Notes  INPATIENT RECREATION TR PLAN  Patient Details Name: Miguel [name] MRN: [id number] DOB: 02-08-1990 Today's Date: 09/24/2014  Rec Therapy Plan Is patient appropriate for Therapeutic Recreation?: Yes Treatment times per week: At least once a week TR Treatment/Interventions: 1:1 session, Group participation (Comment) (Appropriate participation in daily recreation therapy tx)  Discharge Criteria Pt will be discharged from therapy if:: Discharged Treatment plan/goals/alternatives discussed and agreed upon by:: Patient/family  Discharge Summary Short term goals set: See Care Plan Short term goals met: Complete Progress toward goals comments: One-to-one attended One-to-one attended: Stress Management Reason goals not met: N/A Therapeutic equipment acquired: None Reason patient discharged from therapy: Discharge from hospital Pt/family agrees with progress & goals achieved: Yes Date patient discharged from therapy: 09/23/14   [facility], LRT/CTRS 09/24/2014, 5:09 PM

## 2015-03-12 ENCOUNTER — Encounter (HOSPITAL_COMMUNITY): Payer: Self-pay | Admitting: Emergency Medicine

## 2015-03-12 ENCOUNTER — Emergency Department (HOSPITAL_COMMUNITY)
Admission: EM | Admit: 2015-03-12 | Discharge: 2015-03-13 | Disposition: A | Discharge status: Other Acute Inpatient Hospital | Payer: No Typology Code available for payment source

## 2015-03-12 DIAGNOSIS — R45850 Homicidal ideations: Secondary | ICD-10-CM | POA: Insufficient documentation

## 2015-03-12 DIAGNOSIS — Z8781 Personal history of (healed) traumatic fracture: Secondary | ICD-10-CM | POA: Insufficient documentation

## 2015-03-12 DIAGNOSIS — F11220 Opioid dependence with intoxication, uncomplicated: Secondary | ICD-10-CM

## 2015-03-12 DIAGNOSIS — F1120 Opioid dependence, uncomplicated: Secondary | ICD-10-CM | POA: Insufficient documentation

## 2015-03-12 DIAGNOSIS — F1420 Cocaine dependence, uncomplicated: Secondary | ICD-10-CM

## 2015-03-12 DIAGNOSIS — F1320 Sedative, hypnotic or anxiolytic dependence, uncomplicated: Secondary | ICD-10-CM | POA: Insufficient documentation

## 2015-03-12 DIAGNOSIS — Z79899 Other long term (current) drug therapy: Secondary | ICD-10-CM | POA: Insufficient documentation

## 2015-03-12 DIAGNOSIS — E669 Obesity, unspecified: Secondary | ICD-10-CM | POA: Insufficient documentation

## 2015-03-12 DIAGNOSIS — F332 Major depressive disorder, recurrent severe without psychotic features: Secondary | ICD-10-CM | POA: Insufficient documentation

## 2015-03-12 DIAGNOSIS — R000 Tachycardia, unspecified: Secondary | ICD-10-CM | POA: Insufficient documentation

## 2015-03-12 LAB — COMPREHENSIVE METABOLIC PANEL
ALK PHOS: 86 U/L (ref 38–126)
ALT: 78 U/L — ABNORMAL HIGH (ref 17–63)
ANION GAP: 12 (ref 5–15)
AST: 55 U/L — ABNORMAL HIGH (ref 15–41)
Albumin: 4.6 g/dL (ref 3.5–5.0)
BUN: 11 mg/dL (ref 6–20)
CALCIUM: 9.6 mg/dL (ref 8.9–10.3)
CO2: 25 mmol/L (ref 22–32)
Chloride: 103 mmol/L (ref 101–111)
Creatinine, Ser: 0.87 mg/dL (ref 0.61–1.24)
GFR calc Af Amer: >60 mL/min (ref >60)
GLUCOSE: 127 mg/dL — AB (ref 65–99)
POTASSIUM: 3.9 mmol/L (ref 3.5–5.1)
Sodium: 140 mmol/L (ref 135–145)
TOTAL PROTEIN: 8.2 g/dL — AB (ref 6.5–8.1)
Total Bilirubin: 0.9 mg/dL (ref 0.3–1.2)

## 2015-03-12 LAB — ETHANOL

## 2015-03-12 LAB — CBC
HEMATOCRIT: 46.1 % (ref 39.0–52.0)
Hemoglobin: 15.8 g/dL (ref 13.0–17.0)
MCH: 29.2 pg (ref 26.0–34.0)
MCHC: 34.3 g/dL (ref 30.0–36.0)
MCV: 85.1 fL (ref 78.0–100.0)
PLATELETS: 336 10*3/uL (ref 150–400)
RBC: 5.42 MIL/uL (ref 4.22–5.81)
RDW: 11.9 % (ref 11.5–15.5)
WBC: 16.4 10*3/uL — AB (ref 4.0–10.5)

## 2015-03-12 LAB — RAPID URINE DRUG SCREEN, HOSP PERFORMED
Amphetamines: NOT DETECTED
BARBITURATES: NOT DETECTED
Benzodiazepines: POSITIVE — AB
Cocaine: POSITIVE — AB
Opiates: POSITIVE — AB
Tetrahydrocannabinol: NOT DETECTED

## 2015-03-12 LAB — ACETAMINOPHEN LEVEL: ACETAMINOPHEN (TYLENOL), SERUM: 14 ug/mL (ref 10–30)

## 2015-03-12 LAB — SALICYLATE LEVEL: Salicylate Lvl: <4 mg/dL (ref 2.8–30.0)

## 2015-03-12 NOTE — BH Assessment (Addendum)
Assessment Note  Patient is a white 25 year old white male that reports SI with a plan to overdose on pills.  Patient reports a prior psychiatric hospitalization in July 2016 for a suicide attempt.    Patient reports that voices are telling him to kill himself.  Patient reports that he has been hearing babies crying.  Patient reports that his child died at the age of 110 months old in 2013.    Patient reports that he has been diagnosed with PTSD, Bipolar and Schizophrenia in the past.  Patient reports that he was not compliant with taking his psychiatric medication once discharged from [facility].    Patient reports that he did not follow up with the outpatient provider appointment when he was discharged from [facility]. Patient denies going to outpatient mental health counseling appointments.  Patient denies physical, sexual or emotional abuse.   Patient reports increased feelings of depression associated with losing his house and experiencing family problems.  Patient did not want to elaborate on his family problems or how he lost his home.  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  Patient reports that he now lives with a roommate.  Patient reports that his mother brought him to the ER.   Patient reports that his increased depression has caused him to use more and more pain pills.  Patient reports that he began abusing pain pills 5 years ago. Patient reports that he used 4-5 pain pills daily.  Patient denies withdrawal symptoms.   Patient UDS is positive for cocaine, opiates and benzos.  Patient denies a history of seizures.  Patient reports that he wants help with abusing pain pills.  Patient denies prior detox or treatment.   Documentation in the epic chart reports that, when asked if he has any homicidal ideation he nodded yes and states that sometime he wants to kill "annoying people around me" but denies having any specific plan    Diagnosis: Schizophrenia, Opiate  Abuse   Past Medical History:  Past Medical History  Diagnosis Date  . Hand fracture, right 2008    no surgery required  . Hand fracture, left 2010  . Depression   . Schizophrenia (HCC)   . Bipolar 1 disorder [facility])     Past Surgical History  Procedure Laterality Date  . Left hand fracture Left 2010    Dr Magnus Ivan  . Cyst excision      Family History:  Family History  Problem Relation Age of Onset  . Bipolar disorder Mother   . Schizophrenia Mother   . Schizophrenia Sister     Social History:  reports that he has never smoked. He has never used smokeless tobacco. He reports that he does not drink alcohol or use illicit drugs.  Additional Social History:  Alcohol / Drug Use History of alcohol / drug use?: Yes Longest period of sobriety (when/how long): Patient reports that he is not able to remember  Negative Consequences of Use: Financial, Personal relationships, Work / School Withdrawal Symptoms:  (None Reported) Substance #1 Name of Substance 1: Pain Pills  1 - Age of First Use: 25 years old  1 - Amount (size/oz): 4-5 Oxycodone  1 - Frequency: Daily 1 - Duration: 5 years 1 - Last Use / Amount: Today   CIWA: CIWA-Ar BP: 136/95 mmHg Pulse Rate: 115 COWS:    Allergies:  Allergies  Allergen Reactions  . Tramadol Hives  . Vicodin [Hydrocodone-Acetaminophen] Nausea Only    Home Medications:  (  Not in a hospital admission)  OB/GYN Status:  No LMP for male patient.  General Assessment Data Location of Assessment: [facility] TTS Assessment: In system Is this a Tele or Face-to-Face Assessment?: Face-to-Face Is this an Initial Assessment or a Re-assessment for this encounter?: Initial Assessment Marital status: Single Maiden name: NA Is patient pregnant?: No Pregnancy Status: No Living Arrangements: Other (Comment) (Lives with his room mate) Can pt return to current living arrangement?: Yes Admission Status: Voluntary Is patient capable of signing voluntary  admission?: Yes Referral Source: Self/Family/Friend Insurance type: Med Pay     Crisis Care Plan Living Arrangements: Other (Comment) (Lives with his room mate) Legal Guardian:  (NA) Name of Psychiatrist: None Reported Name of Therapist: None Reported  Education Status Is patient currently in school?: No Current Grade: NA Highest grade of school patient has completed: NA Name of school: NA Contact person: NA  Risk to self with the past 6 months Suicidal Ideation: Yes-Currently Present Has patient been a risk to self within the past 6 months prior to admission? : Yes Suicidal Intent: Yes-Currently Present Has patient had any suicidal intent within the past 6 months prior to admission? : Yes Is patient at risk for suicide?: Yes Suicidal Plan?: Yes-Currently Present Has patient had any suicidal plan within the past 6 months prior to admission? : Yes Specify Current Suicidal Plan: Overdose on pills Access to Means: Yes Specify Access to Suicidal Means: Pills What has been your use of drugs/alcohol within the last 12 months?: Pain pills Previous Attempts/Gestures: Yes How many times?: 1 Other Self Harm Risks: None Reported Triggers for Past Attempts: Other (Comment) (Addicted to drugs, per patient ) Intentional Self Injurious Behavior: None Family Suicide History: No Recent stressful life event(s): Job Loss, Financial Problems, Other (Comment) (Drug abuse) Persecutory voices/beliefs?: Yes Depression: Yes Depression Symptoms: Despondent, Fatigue, Guilt, Loss of interest in usual pleasures, Feeling worthless/self pity Substance abuse history and/or treatment for substance abuse?: Yes Suicide prevention information given to non-admitted patients: Yes  Risk to Others within the past 6 months Homicidal Ideation: Yes-Currently Present Does patient have any lifetime risk of violence toward others beyond the six months prior to admission? : No Thoughts of Harm to Others:  Yes-Currently Present Comment - Thoughts of Harm to Others: and states that sometime he wants to kill "annoying people around me" but denies having any specific plan. Current Homicidal Intent: Yes-Currently Present Current Homicidal Plan: No Access to Homicidal Means: No Identified Victim: None Reported History of harm to others?: No Assessment of Violence: None Noted Violent Behavior Description: None Reported Does patient have access to weapons?: No Criminal Charges Pending?: No Does patient have a court date: No Is patient on probation?: No  Psychosis Hallucinations: Auditory, Visual Delusions: Grandiose (Hears babies crying )  Mental Status Report Appearance/Hygiene: Disheveled, In scrubs Eye Contact: Fair Speech: Logical/coherent Level of Consciousness: Alert Mood: Depressed, Suspicious Affect: Blunted Anxiety Level: None Thought Processes: Coherent, Relevant Judgement: Unimpaired Orientation: Person, Place, Time, Situation Obsessive Compulsive Thoughts/Behaviors: None  Cognitive Functioning Concentration: Normal Memory: Recent Intact, Remote Intact IQ: Average Insight: Fair Impulse Control: Poor Appetite: Poor Weight Loss: 0 Weight Gain: 0 Sleep: Decreased Total Hours of Sleep: 5 Vegetative Symptoms: Decreased grooming, Staying in bed  ADLScreening [facility]) Patient's cognitive ability adequate to safely complete daily activities?: Yes Patient able to express need for assistance with ADLs?: Yes Independently performs ADLs?: Yes (appropriate for developmental age)  Prior Inpatient Therapy Prior Inpatient Therapy: Yes Prior Therapy Dates:  2016 July Prior Therapy Facilty/Provider(s): [facility] Reason for Treatment: SI   Prior Outpatient Therapy Prior Outpatient Therapy: No Prior Therapy Dates: NA Prior Therapy Facilty/Provider(s): NA Reason for Treatment: NA Does patient have an ACCT team?: No Does patient have Intensive In-House Services?  :  No Does patient have [facility] services? : No Does patient have P4CC services?: No  ADL Screening (condition at time of admission) Patient's cognitive ability adequate to safely complete daily activities?: Yes Is the patient deaf or have difficulty hearing?: No Does the patient have difficulty seeing, even when wearing glasses/contacts?: No Does the patient have difficulty concentrating, remembering, or making decisions?: No Patient able to express need for assistance with ADLs?: Yes Does the patient have difficulty dressing or bathing?: No Independently performs ADLs?: Yes (appropriate for developmental age) Does the patient have difficulty walking or climbing stairs?: No Weakness of Legs: None Weakness of Arms/Hands: None  Home Assistive Devices/Equipment Home Assistive Devices/Equipment: None    Abuse/Neglect Assessment (Assessment to be complete while patient is alone) Physical Abuse: Denies Verbal Abuse: Denies Sexual Abuse: Denies Exploitation of patient/patient's resources: Denies Self-Neglect: Denies Values / Beliefs Cultural Requests During Hospitalization: None Spiritual Requests During Hospitalization: None Consults Spiritual Care Consult Needed: No Social Work Consult Needed: No Merchant navy officer (For Healthcare) Does patient have an advance directive?: No Would patient like information on creating an advanced directive?: No - patient declined information    Additional Information 1:1 In Past 12 Months?: No CIRT Risk: No Elopement Risk: No Does patient have medical clearance?: Yes     Disposition: Per Leonette Most, NP - patient meets criteria for inpatient hospitalization.  No appropriate beds at [facility].  TTS will seek placement.  Disposition Initial Assessment Completed for this Encounter: Yes  On Site Evaluation by:   Reviewed with Physician:    Phillip Heal LaVerne 03/12/2015 8:11 PM

## 2015-03-12 NOTE — ED Notes (Signed)
Patient states he is here because " I am having thoughts of hurting myself by overdosing. Patient states he uses opiates which he buys from off the street. Patient oriented to unit, Q 15 minute checks initiated and maintained for safety. Monitoring continues.

## 2015-03-12 NOTE — ED Notes (Signed)
Pt presents with SI with plan to "overdose on some pills;" pt reports recent stressors e.g. Lost house. Pt also reports would like opiate detox.

## 2015-03-12 NOTE — ED Provider Notes (Signed)
CSN: [id number]     Arrival date & time 03/12/15  1826 History   First MD Initiated Contact with Patient 03/12/15 1849     Chief Complaint  Patient presents with  . Suicidal     (Consider location/radiation/quality/duration/timing/severity/associated sxs/prior Treatment) HPI   25 year old male with history of bipolar, depression, suicidal attempt, schizophrenia presenting with suicidal ideation. Patient reports for the past 3-4 weeks he has been having worsening PTSD and depression along with having suicidal thoughts with plan to overdose on medication. He attributed his stress due to losing his house and having family problems. He admits to using cocaine, last use was last night, and admits to abusing alcohol, last use was 2 days ago total of 12 pack. He is eating less, having trouble sleeping, having auditory hallucination, states that he hears babies voices. When asked if he has any homicidal ideation he nodded yes and states that sometime he wants to kill "annoying people around me" but denies having any specific plan. He admits that he has been abusing drugs and alcohol, and would like help with detox. States he has never gone through detox before. Aside from mild pain to his nose he denies any nosebleed or having any other pain. He is actively suicidal but does want help.  Past Medical History  Diagnosis Date  . Hand fracture, right 2008    no surgery required  . Hand fracture, left 2010  . Depression   . Schizophrenia (HCC)   . Bipolar 1 disorder [facility])    Past Surgical History  Procedure Laterality Date  . Left hand fracture Left 2010    Dr Magnus Ivan  . Cyst excision     Family History  Problem Relation Age of Onset  . Bipolar disorder Mother   . Schizophrenia Mother   . Schizophrenia Sister    Social History  Substance Use Topics  . Smoking status: Never Smoker   . Smokeless tobacco: Never Used  . Alcohol Use: No     Comment: He drank this time in effort to hurt self/  usually non drinker    Review of Systems  All other systems reviewed and are negative.     Allergies  Tramadol and Vicodin  Home Medications   Prior to Admission medications   Medication Sig Start Date End Date Taking? Authorizing Provider  HYDROcodone-acetaminophen (NORCO) 10-325 MG tablet Take 30 tablets by mouth every 2 (two) hours as needed for moderate pain or severe pain.   Yes Historical Provider, MD  Oxycodone HCl 10 MG TABS Take 30 mg by mouth daily as needed (addiction).   Yes Historical Provider, MD  oxyCODONE-acetaminophen (PERCOCET) 7.5-325 MG tablet Take 3 tablets by mouth every 2 (two) hours as needed for moderate pain or severe pain.   Yes Historical Provider, MD  oxyCODONE-acetaminophen (PERCOCET/ROXICET) 5-325 MG tablet Take 2 tablets by mouth every 2 (two) hours as needed for moderate pain or severe pain.   Yes Historical Provider, MD  amLODipine (NORVASC) 10 MG tablet Take 1 tablet (10 mg total) by mouth daily. 09/23/14   Shari Prows, MD  FLUoxetine (PROZAC) 20 MG capsule Take 1 capsule (20 mg total) by mouth daily. 09/23/14   Shari Prows, MD  traZODone (DESYREL) 100 MG tablet Take 1 tablet (100 mg total) by mouth at bedtime as needed for sleep. 09/23/14   Jolanta B Pucilowska, MD   BP 136/95 mmHg  Pulse 115  Temp(Src) 98 F (36.7 C) (Oral)  Resp 18  SpO2 99%  Physical Exam  Constitutional: He is oriented to person, place, and time. He appears well-developed and well-nourished. No distress.  Obesity obese Caucasian male appears depressed.  HENT:  Head: Atraumatic.  Eyes: Conjunctivae are normal.  Neck: Neck supple.  Cardiovascular:  Mild tachycardia without murmurs or gallops  Pulmonary/Chest: Effort normal and breath sounds normal.  Abdominal: Soft. Bowel sounds are normal. There is no tenderness.  Neurological: He is alert and oriented to person, place, and time. GCS eye subscore is 4. GCS verbal subscore is 5. GCS motor subscore is 6.   Skin: No rash noted.  Psychiatric: His speech is normal. Judgment normal. He is withdrawn. Thought content is not paranoid and not delusional. Cognition and memory are normal. He exhibits a depressed mood. He expresses homicidal and suicidal ideation.  Nursing note and vitals reviewed.   ED Course  Procedures (including critical care time) Labs Review Labs Reviewed  COMPREHENSIVE METABOLIC PANEL - Abnormal; Notable for the following:    Glucose, Bld 127 (*)    Total Protein 8.2 (*)    AST 55 (*)    ALT 78 (*)    All other components within normal limits  CBC - Abnormal; Notable for the following:    WBC 16.4 (*)    All other components within normal limits  URINE RAPID DRUG SCREEN, HOSP PERFORMED - Abnormal; Notable for the following:    Opiates POSITIVE (*)    Cocaine POSITIVE (*)    Benzodiazepines POSITIVE (*)    All other components within normal limits  ETHANOL  SALICYLATE LEVEL  ACETAMINOPHEN LEVEL    Imaging Review No results found. I have personally reviewed and evaluated these images and lab results as part of my medical decision-making.   EKG Interpretation None      MDM   Final diagnoses:  Depression with suicidal ideation  Polysubstance dependence including opioid type drug, episodic abuse, uncomplicated    BP 130/85 mmHg  Pulse 98  Temp(Src) 98.4 F (36.9 C) (Oral)  Resp 16  SpO2 99%   7:28 PM Patient here with suicidal ideation and vague homicidal thoughts. He also is a polysubstance abuser. He will needs to be medically cleared and psychiatrically assess for further management of his condition. Labs will be obtained.    10:28 PM Evidence of leukocytosis with WBC of 16.4, likely reactive. Urine drug screen is positive for opiates, cocaine, and benzodiazepine. At this time patient is without any medical problem that will preclude him from further psychiatric assessment. TTS has evaluated patient and felt he would need admission however, there are  no bed available at this time. There will actively find placement for patient.  Fayrene Helper, PA-C 03/12/15 2230  Lyndal Pulley, MD 03/13/15 1255

## 2015-03-12 NOTE — BH Assessment (Signed)
Per, Leonette Most, NP - patient meets criteria for inpatient hospitalization.  At present [facility] does not have an appropriate bed for the patient.  Writer informed the PA-C,  BowieTran  of the patients disposition.  TTS will refer to other facilities.

## 2015-03-13 ENCOUNTER — Encounter (HOSPITAL_COMMUNITY): Payer: Self-pay | Admitting: Behavioral Health

## 2015-03-13 ENCOUNTER — Inpatient Hospital Stay (HOSPITAL_COMMUNITY)
Admission: AD | Admit: 2015-03-13 | Discharge: 2015-03-18 | DRG: 885 | Disposition: A | Discharge status: Home / Self Care | Payer: No Typology Code available for payment source | Source: Intra-hospital | Attending: Psychiatry | Admitting: Psychiatry

## 2015-03-13 DIAGNOSIS — F332 Major depressive disorder, recurrent severe without psychotic features: Secondary | ICD-10-CM | POA: Diagnosis not present

## 2015-03-13 DIAGNOSIS — F322 Major depressive disorder, single episode, severe without psychotic features: Secondary | ICD-10-CM | POA: Diagnosis present

## 2015-03-13 DIAGNOSIS — R45851 Suicidal ideations: Secondary | ICD-10-CM | POA: Diagnosis not present

## 2015-03-13 DIAGNOSIS — F1920 Other psychoactive substance dependence, uncomplicated: Secondary | ICD-10-CM | POA: Diagnosis present

## 2015-03-13 DIAGNOSIS — F1420 Cocaine dependence, uncomplicated: Secondary | ICD-10-CM

## 2015-03-13 DIAGNOSIS — F1120 Opioid dependence, uncomplicated: Secondary | ICD-10-CM | POA: Diagnosis present

## 2015-03-13 DIAGNOSIS — R45850 Homicidal ideations: Secondary | ICD-10-CM | POA: Diagnosis not present

## 2015-03-13 LAB — URINALYSIS, ROUTINE W REFLEX MICROSCOPIC
Bilirubin Urine: NEGATIVE
Glucose, UA: NEGATIVE mg/dL
Hgb urine dipstick: NEGATIVE
KETONES UR: NEGATIVE mg/dL
NITRITE: NEGATIVE
PROTEIN: NEGATIVE mg/dL
Specific Gravity, Urine: 1.024 (ref 1.005–1.030)
pH: 5.5 (ref 5.0–8.0)

## 2015-03-13 LAB — URINE MICROSCOPIC-ADD ON
Bacteria, UA: NONE SEEN
RBC / HPF: NONE SEEN RBC/hpf (ref 0–5)

## 2015-03-13 MED ORDER — LOPERAMIDE HCL 2 MG PO CAPS: 2.0000 mg | ORAL_CAPSULE | ORAL | Status: DC | PRN

## 2015-03-13 MED ORDER — LOPERAMIDE HCL 2 MG PO CAPS
2.0000 mg | ORAL_CAPSULE | ORAL | Status: DC | PRN
  Administered 2015-03-15: 4 mg via ORAL
  Filled 2015-03-13: qty 2

## 2015-03-13 MED ORDER — HYDROXYZINE HCL 25 MG PO TABS: 25.0000 mg | ORAL_TABLET | Freq: Four times a day (QID) | ORAL | Status: DC | PRN

## 2015-03-13 MED ORDER — CLONIDINE HCL 0.1 MG PO TABS
0.1000 mg | ORAL_TABLET | Freq: Four times a day (QID) | ORAL | Status: DC
  Administered 2015-03-13: 0.1 mg via ORAL
  Filled 2015-03-13: qty 1

## 2015-03-13 MED ORDER — ONDANSETRON 4 MG PO TBDP
4.0000 mg | ORAL_TABLET | Freq: Three times a day (TID) | ORAL | Status: DC | PRN
  Administered 2015-03-13: 4 mg via ORAL
  Filled 2015-03-13: qty 1

## 2015-03-13 MED ORDER — CLONIDINE HCL 0.1 MG PO TABS
0.1000 mg | ORAL_TABLET | ORAL | Status: AC
  Administered 2015-03-15 – 2015-03-17 (×4): 0.1 mg via ORAL
  Filled 2015-03-13 (×4): qty 1

## 2015-03-13 MED ORDER — DICYCLOMINE HCL 20 MG PO TABS
20.0000 mg | ORAL_TABLET | Freq: Four times a day (QID) | ORAL | Status: DC | PRN
  Administered 2015-03-13 – 2015-03-15 (×2): 20 mg via ORAL
  Filled 2015-03-13 (×2): qty 1

## 2015-03-13 MED ORDER — NAPROXEN 500 MG PO TABS: 500.0000 mg | ORAL_TABLET | Freq: Two times a day (BID) | ORAL | Status: DC | PRN

## 2015-03-13 MED ORDER — NAPROXEN 500 MG PO TABS
500.0000 mg | ORAL_TABLET | Freq: Two times a day (BID) | ORAL | Status: DC | PRN
  Administered 2015-03-13: 500 mg via ORAL
  Filled 2015-03-13 (×2): qty 1

## 2015-03-13 MED ORDER — MAGNESIUM HYDROXIDE 400 MG/5ML PO SUSP: 30.0000 mL | Freq: Every day | ORAL | Status: DC | PRN

## 2015-03-13 MED ORDER — CLONIDINE HCL 0.1 MG PO TABS: 0.1000 mg | ORAL_TABLET | Freq: Every day | ORAL | Status: DC

## 2015-03-13 MED ORDER — CLONIDINE HCL 0.1 MG PO TABS
0.1000 mg | ORAL_TABLET | Freq: Four times a day (QID) | ORAL | Status: AC
  Administered 2015-03-13 – 2015-03-15 (×7): 0.1 mg via ORAL
  Filled 2015-03-13 (×8): qty 1

## 2015-03-13 MED ORDER — HYDROXYZINE HCL 25 MG PO TABS
25.0000 mg | ORAL_TABLET | Freq: Four times a day (QID) | ORAL | Status: DC | PRN
  Administered 2015-03-13: 25 mg via ORAL
  Filled 2015-03-13: qty 1

## 2015-03-13 MED ORDER — ALUM & MAG HYDROXIDE-SIMETH 200-200-20 MG/5ML PO SUSP
30.0000 mL | ORAL | Status: DC | PRN
  Administered 2015-03-15 – 2015-03-16 (×2): 30 mL via ORAL
  Filled 2015-03-13 (×2): qty 30

## 2015-03-13 MED ORDER — CLONIDINE HCL 0.1 MG PO TABS: 0.1000 mg | ORAL_TABLET | ORAL | Status: DC

## 2015-03-13 MED ORDER — TRAZODONE HCL 100 MG PO TABS
100.0000 mg | ORAL_TABLET | Freq: Every evening | ORAL | Status: DC | PRN
Start: 2015-03-13 — End: 2015-03-15
  Administered 2015-03-14: 100 mg via ORAL
  Filled 2015-03-13 (×2): qty 1

## 2015-03-13 MED ORDER — METHOCARBAMOL 500 MG PO TABS: 500.0000 mg | ORAL_TABLET | Freq: Three times a day (TID) | ORAL | Status: DC | PRN

## 2015-03-13 MED ORDER — CLONIDINE HCL 0.1 MG PO TABS
0.1000 mg | ORAL_TABLET | Freq: Every day | ORAL | Status: AC
  Administered 2015-03-17 – 2015-03-18 (×2): 0.1 mg via ORAL
  Filled 2015-03-13 (×2): qty 1

## 2015-03-13 MED ORDER — DICYCLOMINE HCL 20 MG PO TABS: 20.0000 mg | ORAL_TABLET | Freq: Four times a day (QID) | ORAL | Status: DC | PRN

## 2015-03-13 MED ORDER — ONDANSETRON 4 MG PO TBDP: 4.0000 mg | ORAL_TABLET | Freq: Four times a day (QID) | ORAL | Status: DC | PRN

## 2015-03-13 MED ORDER — HYDROXYZINE HCL 25 MG PO TABS
25.0000 mg | ORAL_TABLET | Freq: Four times a day (QID) | ORAL | Status: DC | PRN
  Administered 2015-03-13 – 2015-03-16 (×6): 25 mg via ORAL
  Filled 2015-03-13 (×6): qty 1

## 2015-03-13 MED ORDER — METHOCARBAMOL 500 MG PO TABS
500.0000 mg | ORAL_TABLET | Freq: Three times a day (TID) | ORAL | Status: DC | PRN
Start: 2015-03-13 — End: 2015-03-18
  Administered 2015-03-13 – 2015-03-17 (×7): 500 mg via ORAL
  Filled 2015-03-13 (×7): qty 1

## 2015-03-13 NOTE — BH Assessment (Signed)
Received call from [facility] stating Pt is on wait list.   Harlin Rain Patsy [place], [facility], [facility], [facility] Triage Specialist [phone]

## 2015-03-13 NOTE — Consult Note (Signed)
[facility] Consult   Reason for Consult:  Suicidal ideation, polysubstance abuse Referring Physician:  EDP Patient Identification: Miguel [name] MRN:  [id number] Principal Diagnosis: <principal problem not specified> Diagnosis:   Patient Active Problem List   Diagnosis Date Noted  . Cocaine use disorder, moderate, dependence ([place]) [F14.20] 09/22/2014  . Tobacco use disorder [F17.200] 09/22/2014  . Sedative, hypnotic or anxiolytic use disorder, severe, dependence ([place]) [F13.20] 09/22/2014  . HTN (hypertension) [I10] 09/21/2014  . Suicide attempt ([place]) [T14.91] 09/19/2014  . Nightmares [F51.5]   . PTSD (post-traumatic stress disorder) [F43.10] 06/11/2014  . Severe recurrent major depression without psychotic features ([place]) [F33.2] 06/11/2014    Total Time spent with patient: 30 minutes  Subjective:   Miguel [name] is a 25 y.o. male patient admitted with suicidal ideation and polysubstance abuse  HPI:  Pt is a 25 year old male with a history of depression and polysubstance use. He has been using narcotics such as vicodin and percocets for several years, cocaine and some alcohol. His use of narcotics has accelerated recently. He claims he has lost everything including house, girlfriend and two kids. He had a  baby die in 2013, still hears a baby's voice. He has a plan to kill himself with drug overdose  Past Psychiatric History: several past admissions for suicidal ideation and polysubstance abuse  Risk to Self: Suicidal Ideation: Yes-Currently Present Suicidal Intent: Yes-Currently Present Is patient at risk for suicide?: Yes Suicidal Plan?: Yes-Currently Present Specify Current Suicidal Plan: Overdose on pills Access to Means: Yes Specify Access to Suicidal Means: Pills What has been your use of drugs/alcohol within the last 12 months?: Pain pills How many times?: 1 Other Self Harm Risks: None Reported Triggers for Past Attempts: Other (Comment)  (Addicted to drugs, per patient ) Intentional Self Injurious Behavior: None Risk to Others: Homicidal Ideation: Yes-Currently Present Thoughts of Harm to Others: Yes-Currently Present Comment - Thoughts of Harm to Others: and states that sometime he wants to kill "annoying people around me" but denies having any specific plan. Current Homicidal Intent: Yes-Currently Present Current Homicidal Plan: No Access to Homicidal Means: No Identified Victim: None Reported History of harm to others?: No Assessment of Violence: None Noted Violent Behavior Description: None Reported Does patient have access to weapons?: No Criminal Charges Pending?: No Does patient have a court date: No Prior Inpatient Therapy: Prior Inpatient Therapy: Yes Prior Therapy Dates: 2016 July Prior Therapy Facilty/Provider(s): [facility] Reason for Treatment: SI  Prior Outpatient Therapy: Prior Outpatient Therapy: No Prior Therapy Dates: NA Prior Therapy Facilty/Provider(s): NA Reason for Treatment: NA Does patient have an ACCT team?: No Does patient have Intensive In-House Services?  : No Does patient have [facility] services? : No Does patient have P4CC services?: No  Past Medical History:  Past Medical History  Diagnosis Date  . Hand fracture, right 2008    no surgery required  . Hand fracture, left 2010  . Depression   . Schizophrenia ([place])   . Bipolar 1 disorder [facility])     Past Surgical History  Procedure Laterality Date  . Left hand fracture Left 2010    Dr Ninfa Linden  . Cyst excision     Family History:  Family History  Problem Relation Age of Onset  . Bipolar disorder Mother   . Schizophrenia Mother   . Schizophrenia Sister    Family Psychiatric  History: none Social History:  History  Alcohol Use No    Comment: He drank this time in  effort to hurt self/ usually non drinker     History  Drug Use No    Social History   Social History  . Marital Status: Single    Spouse Name: N/A  . Number of  Children: N/A  . Years of Education: N/A   Social History Main Topics  . Smoking status: Never Smoker   . Smokeless tobacco: Never Used  . Alcohol Use: No     Comment: He drank this time in effort to hurt self/ usually non drinker  . Drug Use: No  . Sexual Activity: Yes    Birth Control/ Protection: Other-see comments     Comment: condom   Other Topics Concern  . None   Social History Narrative   Additional Social History:    Allergies:   Allergies  Allergen Reactions  . Tramadol Hives  . Vicodin [Hydrocodone-Acetaminophen] Nausea Only    Labs:  Results for orders placed or performed during the hospital encounter of 03/12/15 (from the past 48 hour(s))  Urine rapid drug screen (hosp performed) (Not at [facility])     Status: Abnormal   Collection Time: 03/12/15  7:10 PM  Result Value Ref Range   Opiates POSITIVE (A) NONE DETECTED   Cocaine POSITIVE (A) NONE DETECTED   Benzodiazepines POSITIVE (A) NONE DETECTED   Amphetamines NONE DETECTED NONE DETECTED   Tetrahydrocannabinol NONE DETECTED NONE DETECTED   Barbiturates NONE DETECTED NONE DETECTED    Comment:        DRUG SCREEN FOR MEDICAL PURPOSES ONLY.  IF CONFIRMATION IS NEEDED FOR ANY PURPOSE, NOTIFY LAB WITHIN 5 DAYS.        LOWEST DETECTABLE LIMITS FOR URINE DRUG SCREEN Drug Class       Cutoff (ng/mL) Amphetamine      1000 Barbiturate      200 Benzodiazepine   093 Tricyclics       267 Opiates          300 Cocaine          300 THC              50   Comprehensive metabolic panel     Status: Abnormal   Collection Time: 03/12/15  7:20 PM  Result Value Ref Range   Sodium 140 135 - 145 mmol/L   Potassium 3.9 3.5 - 5.1 mmol/L   Chloride 103 101 - 111 mmol/L   CO2 25 22 - 32 mmol/L   Glucose, Bld 127 (H) 65 - 99 mg/dL   BUN 11 6 - 20 mg/dL   Creatinine, Ser 0.87 0.61 - 1.24 mg/dL   Calcium 9.6 8.9 - 10.3 mg/dL   Total Protein 8.2 (H) 6.5 - 8.1 g/dL   Albumin 4.6 3.5 - 5.0 g/dL   AST 55 (H) 15 - 41 U/L   ALT  78 (H) 17 - 63 U/L   Alkaline Phosphatase 86 38 - 126 U/L   Total Bilirubin 0.9 0.3 - 1.2 mg/dL   GFR calc non Af Amer >60 >60 mL/min   GFR calc Af Amer >60 >60 mL/min    Comment: (NOTE) The eGFR has been calculated using the CKD EPI equation. This calculation has not been validated in all clinical situations. eGFR's persistently <60 mL/min signify possible Chronic Kidney Disease.    Anion gap 12 5 - 15  Ethanol (ETOH)     Status: None   Collection Time: 03/12/15  7:20 PM  Result Value Ref Range   Alcohol, Ethyl (B) <5 <5 mg/dL  Comment:        LOWEST DETECTABLE LIMIT FOR SERUM ALCOHOL IS 5 mg/dL FOR MEDICAL PURPOSES ONLY   Salicylate level     Status: None   Collection Time: 03/12/15  7:20 PM  Result Value Ref Range   Salicylate Lvl <4.2 2.8 - 30.0 mg/dL  Acetaminophen level     Status: None   Collection Time: 03/12/15  7:20 PM  Result Value Ref Range   Acetaminophen (Tylenol), Serum 14 10 - 30 ug/mL    Comment:        THERAPEUTIC CONCENTRATIONS VARY SIGNIFICANTLY. A RANGE OF 10-30 ug/mL MAY BE AN EFFECTIVE CONCENTRATION FOR MANY PATIENTS. HOWEVER, SOME ARE BEST TREATED AT CONCENTRATIONS OUTSIDE THIS RANGE. ACETAMINOPHEN CONCENTRATIONS >150 ug/mL AT 4 HOURS AFTER INGESTION AND >50 ug/mL AT 12 HOURS AFTER INGESTION ARE OFTEN ASSOCIATED WITH TOXIC REACTIONS.   CBC     Status: Abnormal   Collection Time: 03/12/15  7:20 PM  Result Value Ref Range   WBC 16.4 (H) 4.0 - 10.5 K/uL   RBC 5.42 4.22 - 5.81 MIL/uL   Hemoglobin 15.8 13.0 - 17.0 g/dL   HCT 46.1 39.0 - 52.0 %   MCV 85.1 78.0 - 100.0 fL   MCH 29.2 26.0 - 34.0 pg   MCHC 34.3 30.0 - 36.0 g/dL   RDW 11.9 11.5 - 15.5 %   Platelets 336 150 - 400 K/uL    Current Facility-Administered Medications  Medication Dose Route Frequency Provider Last Rate Last Dose  . hydrOXYzine (ATARAX/VISTARIL) tablet 25 mg  25 mg Oral Q6H PRN Cloria Spring, MD   25 mg at 03/13/15 1051  . ondansetron (ZOFRAN-ODT) disintegrating  tablet 4 mg  4 mg Oral Q8H PRN Cloria Spring, MD   4 mg at 03/13/15 1051   Current Outpatient Prescriptions  Medication Sig Dispense Refill  . HYDROcodone-acetaminophen (NORCO) 10-325 MG tablet Take 30 tablets by mouth every 2 (two) hours as needed (addiction).     . Oxycodone HCl 10 MG TABS Take 30 mg by mouth daily as needed (addiction).    [name] oxyCODONE-acetaminophen (PERCOCET) 7.5-325 MG tablet Take 3 tablets by mouth every 2 (two) hours as needed (addiction).     [name] oxyCODONE-acetaminophen (PERCOCET/ROXICET) 5-325 MG tablet Take 2 tablets by mouth every 2 (two) hours as needed (addiction).     [name] amLODipine (NORVASC) 10 MG tablet Take 1 tablet (10 mg total) by mouth daily. (Patient not taking: Reported on 03/12/2015) 30 tablet 0  . FLUoxetine (PROZAC) 20 MG capsule Take 1 capsule (20 mg total) by mouth daily. (Patient not taking: Reported on 03/12/2015) 30 capsule 0  . traZODone (DESYREL) 100 MG tablet Take 1 tablet (100 mg total) by mouth at bedtime as needed for sleep. (Patient not taking: Reported on 03/12/2015) 30 tablet 0    Musculoskeletal: Strength & Muscle Tone: within normal limits Gait & Station: normal Patient leans: N/A  Psychiatric Specialty Exam: Review of Systems  Constitutional: Positive for diaphoresis.  Psychiatric/Behavioral: Positive for depression and suicidal ideas. The patient is nervous/anxious.     Blood pressure 151/100, pulse 92, temperature 97.4 F (36.3 C), temperature source Oral, resp. rate 18, SpO2 99 %.There is no weight on file to calculate BMI.  General Appearance: Disheveled  Eye Sport and exercise psychologist::  Fair  Speech:  Clear and Coherent  Volume:  Normal  Mood:  Anxious, Depressed and Dysphoric  Affect:  Depressed and Flat  Thought Process:  Goal Directed  Orientation:  Full (Time, Place, and Person)  Thought Content:  Rumination  Suicidal Thoughts:  Yes.  with intent/plan  Homicidal Thoughts:  No  Memory:  Immediate;   Fair Recent;   Fair Remote;   Fair   Judgement:  Impaired  Insight:  Lacking  Psychomotor Activity:  Restlessness  Concentration:  Poor  Recall:  [employer] of Knowledge:Fair  Language: Good  Akathisia:  No  Handed:  Right  AIMS (if indicated):     Assets:  Communication Skills Desire for Improvement Resilience  ADL's:  Intact  Cognition: WNL  Sleep:      Treatment Plan Summary: Daily contact with patient to assess and evaluate symptoms and progress in treatment and Medication management  Disposition: Recommend psychiatric Inpatient admission when medically cleared.  visteril and zofran given   Clonidine protocol initiated for withdrawal  Levonne Spiller, MD 03/13/2015 11:57 AM

## 2015-03-13 NOTE — Progress Notes (Signed)
D.  Pt is a new admission to unit admitted for detox from opiates.  Pt has remained in bed since admission, and did not feel well enough to attend evening wrap up group.  Pt does endorse passive suicidal ideation but contracts for safety on the unit.  Pt states he sometimes hears a baby crying (he lost a child in 2013).  Pt denies homicidal ideation.  Pt is endorsing some s/s of withdrawal at this time (see CIWA).  A.  Support and encouragement offered, medication given as ordered for withdrawal.  R.  Pt remains safe on the unit, will continue to monitor.

## 2015-03-13 NOTE — BHH Counselor (Signed)
Patient has been accepted to [facility] 406-2 by Dr. Tenny Craw, per Jasmine Pang, RN, [facility].  Patient reviewed and signed voluntary release and consent to treatment. Patient signed ROI to his mother. Patient denies questions or concerns. Patients paperwork faxed to [facility]. Patients paperwork provided to patients nurse. Patient will be transported by [facility]. Call nurse report to [phone].  Davina Poke, LCSW Therapeutic Triage Specialist [facility] 03/13/2015 3:43 PM

## 2015-03-13 NOTE — Tx Team (Addendum)
Initial Interdisciplinary Treatment Plan   PATIENT STRESSORS: Financial difficulties Substance abuse   PATIENT STRENGTHS: Ability for insight Capable of independent living   PROBLEM LIST: Problem List/Patient Goals Date to be addressed Date deferred Reason deferred Estimated date of resolution  "addiction" 03/13/15     "mental state" 03/13/15     Suicidal Ideation                                           DISCHARGE CRITERIA:  Ability to meet basic life and health needs Adequate post-discharge living arrangements Improved stabilization in mood, thinking, and/or behavior  PRELIMINARY DISCHARGE PLAN: Attend aftercare/continuing care group Attend PHP/IOP  PATIENT/FAMIILY INVOLVEMENT: This treatment plan has been presented to and reviewed with the patient, Miguel [name], and/or family member.  The patient and family have been given the opportunity to ask questions and make suggestions.  White, Patrice L 03/13/2015, 5:31 PM

## 2015-03-13 NOTE — Progress Notes (Signed)
Disposition CSW completed additional patient referrals to the following inpatient psych facilities:  [facility]  CSW will continue to follow patient for placement needs.  Seward Speck [facility] Disposition CSW [phone]
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
## 2015-03-14 ENCOUNTER — Encounter (HOSPITAL_COMMUNITY): Payer: Self-pay | Admitting: Psychiatry

## 2015-03-14 DIAGNOSIS — F322 Major depressive disorder, single episode, severe without psychotic features: Principal | ICD-10-CM

## 2015-03-14 DIAGNOSIS — F1920 Other psychoactive substance dependence, uncomplicated: Secondary | ICD-10-CM

## 2015-03-14 DIAGNOSIS — R45850 Homicidal ideations: Secondary | ICD-10-CM

## 2015-03-14 MED ORDER — FLUOXETINE HCL 20 MG PO CAPS
20.0000 mg | ORAL_CAPSULE | Freq: Every day | ORAL | Status: DC
  Administered 2015-03-14 – 2015-03-18 (×6): 20 mg via ORAL
  Filled 2015-03-14 (×7): qty 1

## 2015-03-14 NOTE — BHH Counselor (Signed)
CSW attempted to meet with Pt to complete PSA, however Pt would not wake up to do so. CSW will attempt tomorrow.  Chad Cordial, LCSWA Clinical Social Work [phone]

## 2015-03-14 NOTE — Progress Notes (Signed)
Psychoeducational Group Note  Date:  03/14/2015 Time:  2439  Group Topic/Focus:  Wrap-Up Group:   The focus of this group is to help patients review their daily goal of treatment and discuss progress on daily workbooks.  Participation Level: Did Not Attend  Participation Quality:  Not Applicable  Affect:  Not Applicable  Cognitive:  Not Applicable  Insight:  Not Applicable  Engagement in Group: Not Applicable  Additional Comments:  The patient did not attend group since he was asleep in his bed.   Hazle Coca S 03/14/2015, 12:39 AM

## 2015-03-14 NOTE — Tx Team (Signed)
Interdisciplinary Treatment Plan Update (Adult) Date: 03/14/2015   Date: 03/14/2015 1:20 PM  Progress in Treatment:  Attending groups: No Participating in groups:  No Taking medication as prescribed: Yes  Tolerating medication: Yes  Family/Significant othe contact made: No, CSW assessing for appropriate contact Patient understands diagnosis: Yes AEB seeking help with depression and substance abuse Discussing patient identified problems/goals with staff: Yes  Medical problems stabilized or resolved: Yes  Denies suicidal/homicidal ideation: Pt recently admitted with SI Patient has not harmed self or Others: Yes   New problem(s) identified: None identified at this time.   Discharge Plan or Barriers: CSW will assess for appropriate discharge plan and relevant barriers.   Additional comments:  Patient and CSW reviewed pt's identified goals and treatment plan. Patient verbalized understanding and agreed to treatment plan. CSW reviewed [facility] "Discharge Process and Patient Involvement" Form. Pt verbalized understanding of information provided and signed form.   Reason for Continuation of Hospitalization:  Depression Medication stabilization Suicidal ideation Withdrawal symptoms  Estimated length of stay: 3-5 days  Review of initial/current patient goals per problem list:   1.  Goal(s): Patient will participate in aftercare plan  Met:  No  Target date: 3-5 days from date of admission   As evidenced by: Patient will participate within aftercare plan AEB aftercare provider and housing plan at discharge being identified.  03/14/15: CSW to work with Pt to assess for appropriate discharge plan and faciliate appointments and referrals as needed prior to d/c.  2.  Goal (s): Patient will exhibit decreased depressive symptoms and suicidal ideations.  Met:  No  Target date: 3-5 days from date of admission   As evidenced by: Patient will utilize self rating of depression at 3 or below and  demonstrate decreased signs of depression or be deemed stable for discharge by MD. 03/14/15: Pt was admitted with symptoms of depression, rating 10/10. Pt continues to present with flat affect and depressive symptoms.  Pt will demonstrate decreased symptoms of depression and rate depression at 3/10 or lower prior to discharge.  4.  Goal(s): Patient will demonstrate decreased signs of withdrawal due to substance abuse  Met:  No  Target date: 3-5 days from date of admission   As evidenced by: Patient will produce a CIWA/COWS score of 0, have stable vitals signs, and no symptoms of withdrawal  03/14/15: Pt COWS score of 4, CIWA of 7; Pt endorses GI upset, tremor, auditory disturbances, sweats, anxiety, headache, and joint aches as symptoms of withdrawal.  Attendees:  Patient:    Family:    Physician: Dr. Parke Poisson, MD  03/14/2015 1:20 PM  Nursing: Lars Pinks, RN Case manager  03/14/2015 1:20 PM  Clinical Social Worker Peri Maris, [place] 03/14/2015 1:20 PM  Other: Erasmo Downer Drinkard, LCSWA 03/14/2015 1:20 PM  Clinical:  Kerby Nora, RN; Darrol Angel, RN; Idell Pickles, RN 03/14/2015 1:20 PM  Other: , RN Charge Nurse 03/14/2015 1:20 PM  Other: Hilda Lias, [place], [place] Work [phone]
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
## 2015-03-14 NOTE — [facility] Suicide Risk Assessment (Addendum)
[facility] Admission Suicide Risk Assessment   Nursing information obtained from:  Patient Demographic factors:  Male, Caucasian, Low socioeconomic status, Unemployed Current Mental Status:  Suicidal ideation indicated by patient, Suicide plan, Self-harm thoughts Loss Factors:  Loss of significant relationship, Financial problems / change in socioeconomic status Historical Factors:  Family history of mental illness or substance abuse, Anniversary of important loss Risk Reduction Factors:  Sense of responsibility to family, Positive social support  Total Time spent with patient: 45 minutes Principal Problem: Polysubstance dependence including opioid type drug with complication, continuous use (HCC) Diagnosis:   Patient Active Problem List   Diagnosis Date Noted  . Polysubstance dependence including opioid type drug with complication, continuous use (HCC) [F19.20] 03/14/2015  . Major depressive disorder, single episode, severe without psychotic features (HCC) [F32.2] 03/13/2015  . Cocaine use disorder, moderate, dependence (HCC) [F14.20] 09/22/2014  . Tobacco use disorder [F17.200] 09/22/2014  . Sedative, hypnotic or anxiolytic use disorder, severe, dependence (HCC) [F13.20] 09/22/2014  . HTN (hypertension) [I10] 09/21/2014  . Suicide attempt (HCC) [T14.91] 09/19/2014  . Nightmares [F51.5]   . PTSD (post-traumatic stress disorder) [F43.10] 06/11/2014  . Severe recurrent major depression without psychotic features (HCC) [F33.2] 06/11/2014    Continued Clinical Symptoms:  Alcohol Use Disorder Identification Test Final Score (AUDIT): 25 The "Alcohol Use Disorders Identification Test", Guidelines for Use in Primary Care, Second Edition.  World Science writer [facility]). Score between 0-7:  no or low risk or alcohol related problems. Score between 8-15:  moderate risk of alcohol related problems. Score between 16-19:  high risk of alcohol related problems. Score 20 or above:  warrants further  diagnostic evaluation for alcohol dependence and treatment.   CLINICAL FACTORS:  25 year old male, history of Depression, PTSD,  Substance abuse . Presented to ED due to worsening depression, suicidal thoughts of overdosing. Reports episodic hallucinations,consisting of hearing a child crying.  Has history of abusing opiates and cocaine, at this time reports opiates as substance of choice . Describes significant psychosocial stressors, to include concerns about losing his house and family stressors.  Denies command hallucinations at this time and does not currently present internally preoccupied . [name]  At this time describes some   symptoms of opiate WDL- aches, some cramps , lose stools.  Vitals are stable and he does not present in any acute distress . Tolerating Clonidine taper well at this time. He states he had been on Prozac in the past with good results, but had not recently been taking it . Does not remember having had side effects. Dx- Opiate Dependence, Opiate induced mood disorder, depressed, versus Major Depression, Recurrent. PTSD by history Plan - Continue Opiate Detox Protocol, start Prozac 20 mgrs QDAY , we also discussed starting an antipsychotic medication , due to reports of auditory hallucinations,but patient not currently interested stating that these symptoms also improved with Prozac in the past . He is interested in going to a Rehab setting on discharge . AST and ALT are slightly elevated - we discussed , agrees to repeat , and also to check Hep B and C status   Musculoskeletal: Strength & Muscle Tone: within normal limits- no distal tremors, no diaphoresis, no restlessness  Gait & Station: normal Patient leans: N/A  Psychiatric Specialty Exam: ROS no chest pain, no shortness of breath, (+) nausea, (+) lose stools   Blood pressure 134/82, pulse 78, temperature 98.3 F (36.8 C), temperature source Oral, resp. rate 18, height  (1.702 m), weight 360 lb (163.295  kg).Body  mass index is 56.37 kg/(m^2).  General Appearance: Fairly Groomed  Patent attorney::  Good  Speech:  Normal Rate  Volume:  Decreased  Mood:  Depressed  Affect:  Constricted  Thought Process:  Linear  Orientation:  Other:  fully alert and attentive   Thought Content:  describes auditory hallucinations - hears child crying. At this time not internally preoccupied, no visual hallucinations endorsed, no delusions expressed   Suicidal Thoughts:  No at this time denies plan or intention of suicide and contracts for safety on the unit  Homicidal Thoughts:  No  Memory:  recent and remote grossly intact   Judgement:  Fair  Insight:  Fair  Psychomotor Activity:  Normal  Concentration:  Good  Recall:  Good  Fund of Knowledge:Good  Language: Good  Akathisia:  Negative  Handed:  Right  AIMS (if indicated):     Assets:  Communication Skills Desire for Improvement Resilience  Sleep:  Number of Hours: 6.5  Cognition: WNL  ADL's:  Intact    COGNITIVE FEATURES THAT CONTRIBUTE TO RISK:  Closed-mindedness and Loss of executive function    SUICIDE RISK:   Moderate:  Frequent suicidal ideation with limited intensity, and duration, some specificity in terms of plans, no associated intent, good self-control, limited dysphoria/symptomatology, some risk factors present, and identifiable protective factors, including available and accessible social support.  PLAN OF CARE: Patient will be admitted to inpatient psychiatric unit for stabilization and safety. Will provide and encourage milieu participation. Provide medication management and maked adjustments as needed. Will also provide medication management to minimize risk of opiate WDL.   Will follow daily.    I certify that inpatient services furnished can reasonably be expected to improve the patient's condition.   Nehemiah Massed, MD 03/14/2015, 12:41 PM

## 2015-03-14 NOTE — Progress Notes (Signed)
D: Pt presents with flat affect and depressed mood. Pt rates depression 4/10. Hopeless 4/10. Anxiety 5/10. Pt endorses passive suicidal thoughts with no plan. Pt verbally contracts not to harm self. Pt endorses intermittent AH "hearing babies cry". Pt c/o withdrawal symptoms of chills, diarrhea, cravings, nausea, cramps and running nose. Pt reports fair sleep, good appetite and concentration.  A: Medications reviewed with pt. Medications administered as ordered per MD. Prn meds given for withdrawal symptoms as requested by pt. Pt encouraged to attend groups. 15 minute checks performed for safety. R: Pt verbalized understanding of med regimen. Pt stated goal "getting better". Pt receptive to tx.

## 2015-03-14 NOTE — [facility] Group Notes (Signed)
[facility] LCSW Group Therapy  03/14/2015 1:15pm  Type of Therapy:  Group Therapy vercoming Obstacles  Pt did not attend, declined invitation.   Chad Cordial, LCSWA 03/14/2015 3:55 PM

## 2015-03-14 NOTE — [facility] Group Notes (Signed)
[facility] LCSW Aftercare Discharge Planning Group Note  03/14/2015 8:45 AM  Pt did not attend, declined invitation.   Chad Cordial, LCSWA 03/14/2015 10:13 AM

## 2015-03-14 NOTE — H&P (Signed)
Psychiatric Admission Assessment Adult  Patient Identification: Miguel [name]  MRN:  [id number]  Date of Evaluation:  03/14/2015  Chief Complaint: Polysubstance use/dependence & worsening symptoms of depression/suicidal ideations  Principal Diagnosis: Major depressive disorder, single episode, severe without psychotic features ([place]); Polysubstance use disorder (incuding Opiates), severe dependence   Diagnosis:   Patient Active Problem List   Diagnosis Date Noted  . Major depressive disorder, single episode, severe without psychotic features ([place]) [F32.2] 03/13/2015  . Cocaine use disorder, moderate, dependence ([place]) [F14.20] 09/22/2014  . Tobacco use disorder [F17.200] 09/22/2014  . Sedative, hypnotic or anxiolytic use disorder, severe, dependence ([place]) [F13.20] 09/22/2014  . HTN (hypertension) [I10] 09/21/2014  . Suicide attempt ([place]) [T14.91] 09/19/2014  . Nightmares [F51.5]   . PTSD (post-traumatic stress disorder) [F43.10] 06/11/2014  . Severe recurrent major depression without psychotic features ([place]) [F33.2] 06/11/2014   History of Present Illness: Miguel [name] is a 25 year old Caucasian male. Admitted to [facility] from the [facility] with complaints of worsening symptoms of depression, suicidal ideations & help with drug addiction. During this assessment, Miguel [name] reports, "My Mom took me to the [facility] ED on the 10th or 11 of this month. I was having suicidal thoughts & I need help with drug addiction. I have been using drugs for over 4 years. I was using pills (opiates), cocaine & alcohol. I used & drank last on the day I was brought to the hospital. I do not know why I use drugs, only that it gets me high. I have no sobriety time & I have not had any substance abuse treatments. The suicidal ideations started on the 10th or the 11th of this month. I have been depressed for over 5 months due to my drug use. I have never had treatment for depression. I attempted suicide 8  months ago by an overdose. I was brought to the [facility], then to this hospital. I had experienced traumatic events 3 years ago. I had a 5 months old child that died by suffocation. Just around this time of this bad event, I was diagnosed with Bipolar disorder & Schizophrenia. I was treated with some medication, that did not work for me. I would want medication for depression started while I am".  Objective: Miguel [name] is morbidly obese. He is alert, oriented x 3 & aware of situation. He is verbally responsive. He says he started using drugs at 66, alcohol at age 56. He reports symptoms of PTSD-flashbacks & nightmares from losing a 5 months old 3 years ago. He denies any hx of medical diagnoses, however, report indicated Hx of HTN. He is currently experiencing the symptoms of opioid withdrawals; tremors, runny nose, diarrhea, cold sweats & abdominal cramps. Was treated for Bipolar disorder & Schizophrenia at [facility] 3 years ago, he reported.  Associated Signs/Symptoms:  Depression Symptoms:  depressed mood, insomnia, hopelessness, suicidal thoughts without plan, anxiety, loss of energy/fatigue,  (Hypo) Manic Symptoms:  Hallucinations, Impulsivity,  Anxiety Symptoms:  Excessive Worry, high anxiety levels  Psychotic Symptoms:  Delusions, Hallucinations: Auditory  PTSD Symptoms:"My 5 months old child died by suffocation 3 years ago" Re-experiencing:  Flashbacks Nightmares  Total Time spent with patient: 1 hour  Past Psychiatric History: Bipolar disorder, Polysubstance dependence  Is the patient at risk to self? No.  Has the patient been a risk to self in the past 6 months? No.  Has the patient been a risk to self within the distant past? Yes.   "Suicide attempt by  overdose 8 months ago" Is the patient a risk to others? Yes.   reports homicidal ideations directed towards no one, "just in my head" Has the patient been a risk to others in the past 6 months? No.  Has the patient  been a risk to others within the distant past? No.   Prior Inpatient Therapy: Yes, [facility]) Prior Outpatient Therapy: Yes, [facility].  Alcohol Screening: 1. How often do you have a drink containing alcohol?: 2 to 3 times a week 2. How many drinks containing alcohol do you have on a typical day when you are drinking?: 10 or more 3. How often do you have six or more drinks on one occasion?: Weekly Preliminary Score: 7 4. How often during the last year have you found that you were not able to stop drinking once you had started?: Weekly 5. How often during the last year have you failed to do what was normally expected from you becasue of drinking?: Monthly 6. How often during the last year have you needed a first drink in the morning to get yourself going after a heavy drinking session?: Never 7. How often during the last year have you had a feeling of guilt of remorse after drinking?: Monthly 8. How often during the last year have you been unable to remember what happened the night before because you had been drinking?: Never 9. Have you or someone else been injured as a result of your drinking?: Yes, during the last year 10. Has a relative or friend or a doctor or another health worker been concerned about your drinking or suggested you cut down?: Yes, during the last year Alcohol Use Disorder Identification Test Final Score (AUDIT): 25 Brief Intervention: Yes  Substance Abuse History in the last 12 months:  Yes.    Consequences of Substance Abuse: Medical Consequences:  Liver damage, Possible death by overdose Legal Consequences:  Arrests, jail time, Loss of driving privilege. Family Consequences:  Family discord, divorce and or separation.  Previous Psychotropic Medications: "Yes, I just cannot remember what they were"  Psychological Evaluations: Yes   Past Medical History:  Past Medical History  Diagnosis Date  . Hand fracture, right 2008    no surgery required  . Hand fracture, left  2010  . Depression   . Schizophrenia ([place])   . Bipolar 1 disorder [facility])     Past Surgical History  Procedure Laterality Date  . Left hand fracture Left 2010    Dr Ninfa Linden  . Cyst excision     Family History:  Family History  Problem Relation Age of Onset  . Bipolar disorder Mother   . Schizophrenia Mother   . Schizophrenia Sister    Family Psychiatric  History: Major depression - Mother, familial hx of substance abuse/dependence  Tobacco Screening: '@FLOW'$ ([phone])::1)@  Social History:  History  Alcohol Use No    Comment: He drank this time in effort to hurt self/ usually non drinker     History  Drug Use No    Additional Social History:  Allergies:   Allergies  Allergen Reactions  . Tramadol Hives  . Vicodin [Hydrocodone-Acetaminophen] Nausea Only   Lab Results:  Results for orders placed or performed during the hospital encounter of 03/12/15 (from the past 48 hour(s))  Urine rapid drug screen (hosp performed) (Not at [facility])     Status: Abnormal   Collection Time: 03/12/15  7:10 PM  Result Value Ref Range   Opiates POSITIVE (A) NONE DETECTED   Cocaine POSITIVE (  A) NONE DETECTED   Benzodiazepines POSITIVE (A) NONE DETECTED   Amphetamines NONE DETECTED NONE DETECTED   Tetrahydrocannabinol NONE DETECTED NONE DETECTED   Barbiturates NONE DETECTED NONE DETECTED    Comment:        DRUG SCREEN FOR MEDICAL PURPOSES ONLY.  IF CONFIRMATION IS NEEDED FOR ANY PURPOSE, NOTIFY LAB WITHIN 5 DAYS.        LOWEST DETECTABLE LIMITS FOR URINE DRUG SCREEN Drug Class       Cutoff (ng/mL) Amphetamine      1000 Barbiturate      200 Benzodiazepine   277 Tricyclics       824 Opiates          300 Cocaine          300 THC              50   Comprehensive metabolic panel     Status: Abnormal   Collection Time: 03/12/15  7:20 PM  Result Value Ref Range   Sodium 140 135 - 145 mmol/L   Potassium 3.9 3.5 - 5.1 mmol/L   Chloride 103 101 - 111 mmol/L   CO2 25 22 - 32 mmol/L    Glucose, Bld 127 (H) 65 - 99 mg/dL   BUN 11 6 - 20 mg/dL   Creatinine, Ser 0.87 0.61 - 1.24 mg/dL   Calcium 9.6 8.9 - 10.3 mg/dL   Total Protein 8.2 (H) 6.5 - 8.1 g/dL   Albumin 4.6 3.5 - 5.0 g/dL   AST 55 (H) 15 - 41 U/L   ALT 78 (H) 17 - 63 U/L   Alkaline Phosphatase 86 38 - 126 U/L   Total Bilirubin 0.9 0.3 - 1.2 mg/dL   GFR calc non Af Amer >60 >60 mL/min   GFR calc Af Amer >60 >60 mL/min    Comment: (NOTE) The eGFR has been calculated using the CKD EPI equation. This calculation has not been validated in all clinical situations. eGFR's persistently <60 mL/min signify possible Chronic Kidney Disease.    Anion gap 12 5 - 15  Ethanol (ETOH)     Status: None   Collection Time: 03/12/15  7:20 PM  Result Value Ref Range   Alcohol, Ethyl (B) <5 <5 mg/dL    Comment:        LOWEST DETECTABLE LIMIT FOR SERUM ALCOHOL IS 5 mg/dL FOR MEDICAL PURPOSES ONLY   Salicylate level     Status: None   Collection Time: 03/12/15  7:20 PM  Result Value Ref Range   Salicylate Lvl <2.3 2.8 - 30.0 mg/dL  Acetaminophen level     Status: None   Collection Time: 03/12/15  7:20 PM  Result Value Ref Range   Acetaminophen (Tylenol), Serum 14 10 - 30 ug/mL    Comment:        THERAPEUTIC CONCENTRATIONS VARY SIGNIFICANTLY. A RANGE OF 10-30 ug/mL MAY BE AN EFFECTIVE CONCENTRATION FOR MANY PATIENTS. HOWEVER, SOME ARE BEST TREATED AT CONCENTRATIONS OUTSIDE THIS RANGE. ACETAMINOPHEN CONCENTRATIONS >150 ug/mL AT 4 HOURS AFTER INGESTION AND >50 ug/mL AT 12 HOURS AFTER INGESTION ARE OFTEN ASSOCIATED WITH TOXIC REACTIONS.   CBC     Status: Abnormal   Collection Time: 03/12/15  7:20 PM  Result Value Ref Range   WBC 16.4 (H) 4.0 - 10.5 K/uL   RBC 5.42 4.22 - 5.81 MIL/uL   Hemoglobin 15.8 13.0 - 17.0 g/dL   HCT 46.1 39.0 - 52.0 %   MCV 85.1 78.0 - 100.0 fL  MCH 29.2 26.0 - 34.0 pg   MCHC 34.3 30.0 - 36.0 g/dL   RDW 71.4 68.9 - 72.8 %   Platelets 336 150 - 400 K/uL  Urinalysis, Routine w reflex  microscopic (not at [facility])     Status: Abnormal   Collection Time: 03/13/15  2:50 PM  Result Value Ref Range   Color, Urine YELLOW YELLOW   APPearance CLOUDY (A) CLEAR   Specific Gravity, Urine 1.024 1.005 - 1.030   pH 5.5 5.0 - 8.0   Glucose, UA NEGATIVE NEGATIVE mg/dL   Hgb urine dipstick NEGATIVE NEGATIVE   Bilirubin Urine NEGATIVE NEGATIVE   Ketones, ur NEGATIVE NEGATIVE mg/dL   Protein, ur NEGATIVE NEGATIVE mg/dL   Nitrite NEGATIVE NEGATIVE   Leukocytes, UA SMALL (A) NEGATIVE  Urine microscopic-add on     Status: Abnormal   Collection Time: 03/13/15  2:50 PM  Result Value Ref Range   Squamous Epithelial / LPF 0-5 (A) NONE SEEN   WBC, UA 0-5 0 - 5 WBC/hpf   RBC / HPF NONE SEEN 0 - 5 RBC/hpf   Bacteria, UA NONE SEEN NONE SEEN   Metabolic Disorder Labs:  No results found for: HGBA1C, MPG No results found for: PROLACTIN No results found for: CHOL, TRIG, HDL, CHOLHDL, VLDL, LDLCALC  Current Medications: Current Facility-Administered Medications  Medication Dose Route Frequency Provider Last Rate Last Dose  . alum & mag hydroxide-simeth (MAALOX/MYLANTA) 200-200-20 MG/5ML suspension 30 mL  30 mL Oral Q4H PRN Charm Rings, NP      . cloNIDine (CATAPRES) tablet 0.1 mg  0.1 mg Oral QID Charm Rings, NP   0.1 mg at 03/14/15 0746   Followed by  . [START ON 03/15/2015] cloNIDine (CATAPRES) tablet 0.1 mg  0.1 mg Oral BH-qamhs Charm Rings, NP       Followed by  . [START ON 03/18/2015] cloNIDine (CATAPRES) tablet 0.1 mg  0.1 mg Oral QAC breakfast Charm Rings, NP      . dicyclomine (BENTYL) tablet 20 mg  20 mg Oral Q6H PRN Charm Rings, NP   20 mg at 03/13/15 2114  . hydrOXYzine (ATARAX/VISTARIL) tablet 25 mg  25 mg Oral Q6H PRN Charm Rings, NP   25 mg at 03/14/15 0747  . loperamide (IMODIUM) capsule 2-4 mg  2-4 mg Oral PRN Charm Rings, NP      . magnesium hydroxide (MILK OF MAGNESIA) suspension 30 mL  30 mL Oral Daily PRN Charm Rings, NP      . methocarbamol (ROBAXIN)  tablet 500 mg  500 mg Oral Q8H PRN Charm Rings, NP   500 mg at 03/14/15 0747  . naproxen (NAPROSYN) tablet 500 mg  500 mg Oral BID PRN Charm Rings, NP   500 mg at 03/13/15 2114  . ondansetron (ZOFRAN-ODT) disintegrating tablet 4 mg  4 mg Oral Q8H PRN Charm Rings, NP   4 mg at 03/13/15 2114  . traZODone (DESYREL) tablet 100 mg  100 mg Oral QHS PRN Worthy Flank, NP       PTA Medications: No prescriptions prior to admission   Musculoskeletal: Strength & Muscle Tone: within normal limits Gait & Station: normal Patient leans: N/A  Psychiatric Specialty Exam: Physical Exam  Constitutional: He is oriented to person, place, and time. He appears well-developed.  Obese  HENT:  Head: Normocephalic.  Eyes: Pupils are equal, round, and reactive to light.  Neck: Normal range of motion.  Cardiovascular: Normal rate.  Respiratory: Effort normal.  GI: Soft.  Genitourinary:  Denies any issues in this area  Musculoskeletal: Normal range of motion.  Neurological: He is alert and oriented to person, place, and time.  Skin: Skin is warm and dry.  Psychiatric: His speech is normal. His mood appears anxious. His affect is not angry, not blunt, not labile and not inappropriate. He is actively hallucinating (hears a baby cry). Thought content is delusional. Cognition and memory are normal. He expresses impulsivity. He exhibits a depressed mood.    Review of Systems  Constitutional: Positive for chills, malaise/fatigue and diaphoresis.  HENT: Negative.   Eyes: Negative.   Respiratory: Negative.   Cardiovascular: Negative.   Gastrointestinal: Positive for nausea, abdominal pain and diarrhea.  Genitourinary: Negative.   Musculoskeletal: Positive for myalgias and joint pain.  Skin: Negative.   Neurological: Positive for dizziness and weakness.  Endo/Heme/Allergies: Negative.   Psychiatric/Behavioral: Positive for depression, suicidal ideas, hallucinations (hears a baby cry) and substance  abuse (Hx. polysubstance dependence). Negative for memory loss. The patient is nervous/anxious and has insomnia.     Blood pressure 123/85, pulse 98, temperature 98.3 F (36.8 C), temperature source Oral, resp. rate 18, height '5\' 7"'$  (1.702 m), weight 163.295 kg (360 lb).Body mass index is 56.37 kg/(m^2).  General Appearance: Disheveled, Obese  Eye Contact::  Fair  Speech:  Clear and Coherent  Volume:  Normal  Mood:  Anxious and Depressed  Affect:  Restricted  Thought Process:  Coherent and Logical  Orientation:  Full (Time, Place, and Person)  Thought Content:  Ruminiations & hallucinations - says he hears a baby cry  Suicidal Thoughts:  Denies, however, says has fleeting thoughts off & on, deneis any plans or intent  Homicidal Thoughts:  "Yes, in my head, directed towards no one in particular"  Memory:  Grossly intact  Judgement:  Fair  Insight:  Present  Psychomotor Activity:  Restlessness and high anxiety levels  Concentration:  Fair  Recall:  Good  Fund of Knowledge:Fair  Language: Good  Akathisia:  No  Handed:  Right  AIMS (if indicated):     Assets:  Communication Skills Desire for Improvement  ADL's:  Impaired  Cognition: WNL  Sleep:  Number of Hours: 6.5   Treatment Plan/Recommendations: 1. Admit for crisis management and stabilization, estimated length of stay 3-5 days.  2. Medication management to reduce current symptoms to base line and improve the patient's overall level of functioning;  Clonidine detox protocols for opioid detox, Trazodone 100 mg for insomnia, Prozac 20 mg for depression. 3. Treat health problems as indicated.  4. Develop treatment plan to decrease risk of relapse upon discharge and the need for readmission.  5. Psycho-social education regarding relapse prevention and self care.  6. Health care follow up as needed for medical problems.  7. Review, reconcile, and reinstate any pertinent home medications for other health issues where appropriate. 8.  Call for consults with hospitalist for any additional specialty patient care services as needed.  Observation Level/Precautions:  15 minute checks  Laboratory:  Per ED, BAL <5, UDS (+) for opioid, Benzodiazepine & cocaine  Psychotherapy: Group sessions, AA/NA meetings  Medications: Clonidine detox protocols for opioid detox, Trazodone 100 mg for insomnia.   Consultations: As needed   Discharge Concerns: Mood stability & sobriety    Estimated LOS: 3-5 days  Other: Admit to [address] for detox/mood stabilization   I certify that inpatient services furnished can reasonably be expected to improve the patient's condition.  Encarnacion Slates, NP, PMHNP-BC 2/13/201710:21 AM I have discussed case with NP and have met with patient Agree with NP assessment  25 year old male, history of Depression, PTSD, Substance abuse . Presented to ED due to worsening depression, suicidal thoughts of overdosing. Reports episodic hallucinations,consisting of hearing a child crying.  Has history of abusing opiates and cocaine, at this time reports opiates as substance of choice . Describes significant psychosocial stressors, to include concerns about losing his house and family stressors. Denies command hallucinations at this time and does not currently present internally preoccupied . [name]  At this time describes some symptoms of opiate WDL- aches, some cramps , lose stools. Vitals are stable and he does not present in any acute distress . Tolerating Clonidine taper well at this time. He states he had been on Prozac in the past with good results, but had not recently been taking it . Does not remember having had side effects. Dx- Opiate Dependence, Opiate induced mood disorder, depressed, versus Major Depression, Recurrent. PTSD by history Plan - Continue Opiate Detox Protocol, start Prozac 20 mgrs QDAY , we also discussed starting an antipsychotic medication , due to reports of auditory hallucinations,but patient  not currently interested stating that these symptoms also improved with Prozac in the past . He is interested in going to a Rehab setting on discharge . AST and ALT are slightly elevated - we discussed , agrees to repeat , and also to check Hep B and C status

## 2015-03-15 LAB — CBC WITH DIFFERENTIAL/PLATELET
BASOS ABS: 0 10*3/uL (ref 0.0–0.1)
BASOS PCT: 0 %
Eosinophils Absolute: 0.1 10*3/uL (ref 0.0–0.7)
Eosinophils Relative: 1 %
HEMATOCRIT: 45.8 % (ref 39.0–52.0)
Hemoglobin: 14.6 g/dL (ref 13.0–17.0)
Lymphocytes Relative: 23 %
Lymphs Abs: 2.5 10*3/uL (ref 0.7–4.0)
MCH: 28.2 pg (ref 26.0–34.0)
MCHC: 31.9 g/dL (ref 30.0–36.0)
MCV: 88.6 fL (ref 78.0–100.0)
MONO ABS: 0.7 10*3/uL (ref 0.1–1.0)
Monocytes Relative: 6 %
NEUTROS ABS: 7.6 10*3/uL (ref 1.7–7.7)
NEUTROS PCT: 70 %
PLATELETS: 277 10*3/uL (ref 150–400)
RBC: 5.17 MIL/uL (ref 4.22–5.81)
RDW: 12.1 % (ref 11.5–15.5)
WBC: 10.9 10*3/uL — AB (ref 4.0–10.5)

## 2015-03-15 LAB — HEPATIC FUNCTION PANEL
ALT: 90 U/L — ABNORMAL HIGH (ref 17–63)
AST: 56 U/L — ABNORMAL HIGH (ref 15–41)
Albumin: 4.3 g/dL (ref 3.5–5.0)
Alkaline Phosphatase: 78 U/L (ref 38–126)
TOTAL PROTEIN: 7.4 g/dL (ref 6.5–8.1)
Total Bilirubin: 0.6 mg/dL (ref 0.3–1.2)

## 2015-03-15 LAB — GLUCOSE, CAPILLARY: Glucose-Capillary: 85 mg/dL (ref 65–99)

## 2015-03-15 MED ORDER — TRAZODONE HCL 50 MG PO TABS
50.0000 mg | ORAL_TABLET | Freq: Every evening | ORAL | Status: DC | PRN
  Administered 2015-03-15 – 2015-03-16 (×2): 50 mg via ORAL
  Filled 2015-03-15 (×2): qty 1

## 2015-03-15 NOTE — [facility] Group Notes (Signed)
[facility] Group Notes:  (Nursing/MHT/Case Management/Adjunct)  Date:  03/15/2015  Time: 0900 am  Type of Therapy:  Psychoeducational Skills  Participation Level:  Did Not Attend  Invited to attend; declined.  [place] Mon 03/15/2015, 11:24 AM

## 2015-03-15 NOTE — Progress Notes (Signed)
Adult Psychoeducational Group Note  Date:  03/15/2015 Time:  10:51 PM  Group Topic/Focus:  Wrap-Up Group:   The focus of this group is to help patients review their daily goal of treatment and discuss progress on daily workbooks.  Participation Level:  Active  Participation Quality:  Appropriate  Affect:  Appropriate and Blunted  Cognitive:  Alert  Insight: Appropriate  Engagement in Group:  Engaged  Modes of Intervention:  Discussion  Additional Comments:  Pt stated that today was a half decent day. He does feel better today. His goal for tomorrow is to attend all groups.   Kaleen Odea R 03/15/2015, 10:51 PM

## 2015-03-15 NOTE — Progress Notes (Signed)
D: Patient has been in bed majority of morning, only getting up for medications.  Patient is passively suicidal, however, he contracts for safety on the unit.  He refused to fill out his self inventory sheet.  He presents with flat, blunted affect; sad and depressed mood.  Will continue to assess throughout the day. A: Continue to monitor medication management and MD orders.  Safety checks completed every 15 minutes per protocol.  Offer support and encouragement as needed. R: Patient has minimal interaction with staff and peers.

## 2015-03-15 NOTE — Progress Notes (Signed)
Recreation Therapy Notes  Animal-Assisted Activity (AAA) Program Checklist/Progress Notes Patient Eligibility Criteria Checklist & Daily Group note for Rec Tx Intervention  Date: 02.14.2017 Time: 2:45pm Location: [address]    AAA/T Program Assumption of Risk Form signed by Patient/ or Parent Legal Guardian yes  Patient is free of allergies or sever asthma yes  Patient reports no fear of animals yes  Patient reports no history of cruelty to animals yes  Patient understands his/her participation is voluntary yes  Patient washes hands before animal contact yes  Patient washes hands after animal contact yes  Behavioral Response: Appropriate  Education: Hand Washing, Appropriate Animal Interaction   Education Outcome: Acknowledges education.   Clinical Observations/Feedback: Patient pet therapy dog appropriately and interacted with peers appropriately during session.   Miguel [name], LRT/CTRS  Jearl Klinefelter 03/15/2015 3:18 PM

## 2015-03-15 NOTE — Progress Notes (Signed)
Patient ID: Miguel [name], male   DOB: [birth year], 25 y.o.   MRN: [id number] D: Patient alert and cooperative. Pt detoxing c/o tremors, nausea, and abdominal cramps. Pt mood/affect is anxious. Pt denies SI/HI/AVH. No acute distressed noted.  A: Medications administered as prescribed. Emotional support given and will continue to monitor pt's progress for stabilization. R: Patient remains safe and complaint with medications.

## 2015-03-15 NOTE — Plan of Care (Signed)
Problem: Alteration in thought process Goal: STG-Patient does not respond to command hallucinations Outcome: Progressing Pt denies AVH and does not appeared to be responding to internal stimuli.

## 2015-03-15 NOTE — Progress Notes (Signed)
[facility] MD Progress Note  03/15/2015 12:41 PM Miguel [name]  MRN:  [id number] Subjective:   Patient reports partial improvement compared to his admission presentation. He does continue to report some symptoms of opiate WDL, such as nausea, diffuse aches and diarrhea. Denies medication side effects. Objective : I have discussed case with treatment team and have met with patient . Patient remains depressed, dysphoric but reports some improvement compared to how he felt prior to admission. He denies any suicidal ideations today. As above, continues to report some symptoms of opiate WDL, but does not appear to be in any acute distress. Endorses diarrhea , but no vomiting and reports good PO intake .  He has tended to isolate in his room, with limited milieu participation. Attributes this to discomfort associated with WDL, states he plans to increase group participation today. Has chronic auditory hallucinations, consisting of hearing a child crying, states these are more noticeable at night, at this time denies active hallucinations and does not appear to be internally preoccupied or paranoid . Labs - WBC 10.9 , improved compared to prior CBC .  AST and ALT still elevated, unchanged compared to prior values  Principal Problem: Major depressive disorder, single episode, severe without psychotic features ([place]) Diagnosis:   Patient Active Problem List   Diagnosis Date Noted  . Polysubstance dependence including opioid type drug with complication, continuous use ([place]) [F19.20] 03/14/2015  . Major depressive disorder, single episode, severe without psychotic features ([place]) [F32.2] 03/13/2015  . Cocaine use disorder, moderate, dependence ([place]) [F14.20] 09/22/2014  . Tobacco use disorder [F17.200] 09/22/2014  . Sedative, hypnotic or anxiolytic use disorder, severe, dependence ([place]) [F13.20] 09/22/2014  . HTN (hypertension) [I10] 09/21/2014  . Suicide attempt ([place]) [T14.91] 09/19/2014  . Nightmares  [F51.5]   . PTSD (post-traumatic stress disorder) [F43.10] 06/11/2014  . 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 Hand fracture, right 2008    no surgery required  . Hand fracture, left 2010  . Depression   . Schizophrenia ([place])   . Bipolar 1 disorder [facility])     Past Surgical History  Procedure Laterality Date  . Left hand fracture Left 2010    Dr Ninfa Linden  . Cyst excision     Family History:  Family History  Problem Relation Age of Onset  . Bipolar disorder Mother   . Schizophrenia Mother   . Schizophrenia Sister     Social History:  History  Alcohol Use No    Comment: He drank this time in effort to hurt self/ usually non drinker     History  Drug Use No    Social History   Social History  . Marital Status: Single    Spouse Name: N/A  . Number of Children: N/A  . Years of Education: N/A   Social History Main Topics  . Smoking status: Never Smoker   . Smokeless tobacco: Never Used  . Alcohol Use: No     Comment: He drank this time in effort to hurt self/ usually non drinker  . Drug Use: No  . Sexual Activity: Yes    Birth Control/ Protection: Other-see comments     Comment: condom   Other Topics Concern  . None   Social History Narrative   Additional Social History:   Sleep: reports he slept fairly  Appetite:  Good  Current Medications: Current Facility-Administered Medications  Medication Dose Route Frequency Provider Last Rate Last Dose  . alum & mag hydroxide-simeth (MAALOX/MYLANTA) 200-200-20 MG/5ML suspension 30 mL  30 mL Oral Q4H PRN Patrecia Pour, NP      . cloNIDine (CATAPRES) tablet 0.1 mg  0.1 mg Oral BH-qamhs Patrecia Pour, NP       Followed by  . [START ON 03/18/2015] cloNIDine (CATAPRES) tablet 0.1 mg  0.1 mg Oral QAC breakfast Patrecia Pour, NP      . dicyclomine (BENTYL)  tablet 20 mg  20 mg Oral Q6H PRN Patrecia Pour, NP   20 mg at 03/13/15 2114  . FLUoxetine (PROZAC) capsule 20 mg  20 mg Oral Daily Jenne Campus, MD   20 mg at 03/15/15 0818  . hydrOXYzine (ATARAX/VISTARIL) tablet 25 mg  25 mg Oral Q6H PRN Patrecia Pour, NP   25 mg at 03/14/15 1630  . loperamide (IMODIUM) capsule 2-4 mg  2-4 mg Oral PRN Patrecia Pour, NP      . magnesium hydroxide (MILK OF MAGNESIA) suspension 30 mL  30 mL Oral Daily PRN Patrecia Pour, NP      . methocarbamol (ROBAXIN) tablet 500 mg  500 mg Oral Q8H PRN Patrecia Pour, NP   500 mg at 03/14/15 1630  . naproxen (NAPROSYN) tablet 500 mg  500 mg Oral BID PRN Patrecia Pour, NP   500 mg at 03/13/15 2114  . ondansetron (ZOFRAN-ODT) disintegrating tablet 4 mg  4 mg Oral Q8H PRN Patrecia Pour, NP   4 mg at 03/13/15 2114  . traZODone (DESYREL) tablet 100 mg  100 mg Oral QHS PRN Harriet Butte, NP   100 mg at 03/14/15 2103    Lab Results:  Results for orders placed or performed during the hospital encounter of 03/13/15 (from the past 48 hour(s))  Glucose, capillary     Status: None   Collection Time: 03/15/15  6:15 AM  Result Value Ref Range   Glucose-Capillary 85 65 - 99 mg/dL  Hepatic function panel     Status: Abnormal   Collection Time: 03/15/15  6:42 AM  Result Value Ref Range   Total Protein 7.4 6.5 - 8.1 g/dL   Albumin 4.3 3.5 - 5.0 g/dL   AST 56 (H) 15 - 41 U/L   ALT 90 (H) 17 - 63 U/L   Alkaline Phosphatase 78 38 - 126 U/L   Total Bilirubin 0.6 0.3 - 1.2 mg/dL   Bilirubin, Direct <0.1 (L) 0.1 - 0.5 mg/dL   Indirect Bilirubin NOT CALCULATED 0.3 - 0.9 mg/dL    Comment: Performed at [facility]  CBC with Differential/Platelet     Status: Abnormal   Collection Time: 03/15/15  6:42 AM  Result Value Ref Range   WBC 10.9 (H) 4.0 - 10.5 K/uL   RBC 5.17 4.22 - 5.81 MIL/uL   Hemoglobin 14.6 13.0 - 17.0 g/dL   HCT 45.8 39.0 - 52.0 %   MCV 88.6 78.0 - 100.0 fL   MCH 28.2 26.0 - 34.0 pg   MCHC 31.9  30.0 - 36.0 g/dL   RDW 12.1 11.5 - 15.5 %   Platelets 277 150 - 400 K/uL   Neutrophils Relative % 70 %   Neutro Abs 7.6 1.7 - 7.7 K/uL   Lymphocytes Relative 23 %   Lymphs Abs 2.5 0.7 - 4.0 K/uL   Monocytes Relative 6 %   Monocytes Absolute 0.7  0.1 - 1.0 K/uL   Eosinophils Relative 1 %   Eosinophils Absolute 0.1 0.0 - 0.7 K/uL   Basophils Relative 0 %   Basophils Absolute 0.0 0.0 - 0.1 K/uL    Comment: Performed at [facility]    Physical Findings: AIMS: Facial and Oral Movements Muscles of Facial Expression: None, normal Lips and Perioral Area: None, normal Jaw: None, normal Tongue: None, normal,Extremity Movements Upper (arms, wrists, hands, fingers): None, normal Lower (legs, knees, ankles, toes): None, normal, Trunk Movements Neck, shoulders, hips: None, normal, Overall Severity Severity of abnormal movements (highest score from questions above): None, normal Incapacitation due to abnormal movements: None, normal Patient's awareness of abnormal movements (rate only patient's report): No Awareness, Dental Status Current problems with teeth and/or dentures?: No Does patient usually wear dentures?: No  CIWA:  CIWA-Ar Total: 7 COWS:  COWS Total Score: 6  Musculoskeletal: Strength & Muscle Tone: within normal limits- no tremors, no diaphoresis, no acute distress or restlessness  Gait & Station: normal Patient leans: N/A  Psychiatric Specialty Exam: ROS at this time does not endorse chest pain or shortness of breath, (+) nausea but no vomiting, (+) diarrhea   Blood pressure 131/85, pulse 79, temperature 98.4 F (36.9 C), temperature source Oral, resp. rate 17, height '5\' 7"'$  (1.702 m), weight 360 lb (163.295 kg).Body mass index is 56.37 kg/(m^2).  General Appearance: Fairly Groomed  Engineer, water::  Fair  Speech:  Normal Rate  Volume:  Normal  Mood:  depressed but improving compared to admission  Affect:  Constricted  Thought Process:  Linear   Orientation:  Other:  fully alert and attentive   Thought Content:  no delusions, chronic auditory hallucinations - hears a child crying. At this time no active hallucinations and not internally preoccupied   Suicidal Thoughts:  No currently denies any suicidal ideations and contracts for safety on unit   Homicidal Thoughts:  No  Memory:  recent and remote grossly intact  Judgement:  Fair  Insight:  Fair  Psychomotor Activity:  Decreased- as above, no current tremors or restlessness   Concentration:  Good  Recall:  [place] of Knowledge:Good  Language: Good  Akathisia:  Negative  Handed:  Right  AIMS (if indicated):     Assets:  Desire for Improvement Resilience  ADL's:  Intact  Cognition: WNL  Sleep:  Number of Hours: 6.75  Assessment - patient presents with some improvement in mood and affect, although remains depressed. No SI at this time. He continues to report symptoms of opiate withdrawal, but not in any acute distress and adequate PO intake at present . Decreasing frequency of auditory hallucinations. Tends to isolate in room, which he attributes to ongoing opiate withdrawal symptoms / discomfort. At this time tolerating medications well . Treatment Plan Summary: Daily contact with patient to assess and evaluate symptoms and progress in treatment, Medication management, Plan inpatient treatment  and medications as below Encourage increased milieu and group participation to work on coping skills and symptom reduction Continue to encourage efforts to remain sober and focus on recovery- at this time patient interested in going to a Rehab after discharge Continue Opiate Detox Protocol with Clonidine .  Continue Trazodone at 50 mgrs QHS PRN for insomnia  Continue Prozac 20 mgrs QDAY for depression Continue Vistaril 25 mgrs Q 6 hours PRN for anxiety  Neita Garnet, MD 03/15/2015, 12:41 PM

## 2015-03-15 NOTE — [facility] Group Notes (Signed)
[facility] LCSW Group Therapy 03/15/2015 1:15 PM  Type of Therapy: Group Therapy- Feelings about Diagnosis  Participation Level: Minimal  Participation Quality:  Reserved  Affect:  Withdrawn  Cognitive: Alert and Oriented   Insight:  Developing   Engagement in Therapy: Developing/Improving and Engaged   Modes of Intervention: Clarification, Confrontation, Discussion, Education, Exploration, Limit-setting, Orientation, Problem-solving, Rapport Building, Dance movement psychotherapist, Socialization and Support  Description of Group:   This group will allow patients to explore their thoughts and feelings about diagnoses they have received. Patients will be guided to explore their level of understanding and acceptance of these diagnoses. Facilitator will encourage patients to process their thoughts and feelings about the reactions of others to their diagnosis, and will guide patients in identifying ways to discuss their diagnosis with significant others in their lives. This group will be process-oriented, with patients participating in exploration of their own experiences as well as giving and receiving support and challenge from other group members.  Summary of Progress/Problems:  Pt participated minimally in group but identified that PTSD was a "somewhat new" diagnosis for him and described it as "eye-opening." Pt was attentive to group discussion.  Therapeutic Modalities:   Cognitive Behavioral Therapy Solution Focused Therapy Motivational Interviewing Relapse Prevention Therapy  Chad Cordial, LCSWA 03/15/2015 3:23 PM

## 2015-03-16 LAB — HEPATITIS C ANTIBODY

## 2015-03-16 LAB — HEPATITIS B SURFACE ANTIGEN: HEP B S AG: NEGATIVE

## 2015-03-16 MED ORDER — TRAZODONE HCL 50 MG PO TABS
50.0000 mg | ORAL_TABLET | Freq: Every evening | ORAL | Status: DC | PRN
  Administered 2015-03-16 – 2015-03-17 (×2): 50 mg via ORAL
  Filled 2015-03-16: qty 1

## 2015-03-16 NOTE — Plan of Care (Signed)
Problem: Alteration in mood & ability to function due to Goal: STG-Patient will report withdrawal symptoms Outcome: Progressing Pt reported withdrawal symptoms to Clinical research associate. Medication administered

## 2015-03-16 NOTE — Progress Notes (Signed)
Patient ID: Miguel [name], male   DOB: May 27, 1990, 25 y.o.   MRN: [id number] [facility] MD Progress Note  03/16/2015 3:52 PM Miguel [name]  MRN:  [id number] Subjective:   Reports feeling better than on admission, but still far from baseline, with some residual depression, sadness. Denies suicidal ideations, today does not endorse hallucinations. Symptoms of opiate withdrawal are subsiding gradually- he reports some persistent lose stools , but otherwise states " I am doing better " At this time denies medication side effects. He is focused on going to a Rehab Setting after discharge and states he has been making inquiries into [facility], which he states he has " heard really good things about".   Objective : I have discussed case with treatment team and have met with patient . As above, reports partial improvement in mood. Affect does present partially improved and less constricted . No current restlessness or discomfort- does not appear to be in any acute distress or withdrawal- reports improving symptoms of opiate WDL. At this time denies cravings, but does state that he feels strongly he should go to a Rehab setting to minimize his risk of relapsing . Behavior on unit in good control- has been going to some groups. Labs- Hep B S Ag and Hep C Ab negative - chronic mild elevation of AST/ALT-. No choluria, no acholia, no RUQ pain, no jaundice-  patient denies alcohol abuse, would consider fatty liver as a possible contributor- recommended that patient follow up with PCP for ongoing monitoring and treatment if needed  WBC 10.9 - improved compared to prior  Principal Problem: Major depressive disorder, single episode, severe without psychotic features ([place]) Diagnosis:   Patient Active Problem List   Diagnosis Date Noted  . Polysubstance dependence including opioid type drug with complication, continuous use ([place]) [F19.20] 03/14/2015  . Major depressive disorder, single episode, severe  without psychotic features ([place]) [F32.2] 03/13/2015  . Cocaine use disorder, moderate, dependence ([place]) [F14.20] 09/22/2014  . Tobacco use disorder [F17.200] 09/22/2014  . Sedative, hypnotic or anxiolytic use disorder, severe, dependence ([place]) [F13.20] 09/22/2014  . HTN (hypertension) [I10] 09/21/2014  . Suicide attempt ([place]) [T14.91] 09/19/2014  . Nightmares [F51.5]   . PTSD (post-traumatic stress disorder) [F43.10] 06/11/2014  . Severe recurrent major depression without psychotic features ([place]) [F33.2] 06/11/2014   Total Time spent with patient: 20 minutes    Past Medical History:  Past Medical History  Diagnosis Date  . Hand fracture, right 2008    no surgery required  . Hand fracture, left 2010  . Depression   . Schizophrenia ([place])   . Bipolar 1 disorder [facility])     Past Surgical History  Procedure Laterality Date  . Left hand fracture Left 2010    Dr Ninfa Linden  . Cyst excision     Family History:  Family History  Problem Relation Age of Onset  . Bipolar disorder Mother   . Schizophrenia Mother   . Schizophrenia Sister     Social History:  History  Alcohol Use No    Comment: He drank this time in effort to hurt self/ usually non drinker     History  Drug Use No    Social History   Social History  . Marital Status: Single    Spouse Name: N/A  . Number of Children: N/A  . Years of Education: N/A   Social History Main Topics  . Smoking status: Never Smoker   . Smokeless tobacco: Never Used  . Alcohol Use:  No     Comment: He drank this time in effort to hurt self/ usually non drinker  . Drug Use: No  . Sexual Activity: Yes    Birth Control/ Protection: Other-see comments     Comment: condom   Other Topics Concern  . None   Social History Narrative   Additional Social History:   Sleep: gradually improving as he feels better   Appetite:  Good  Current Medications: Current Facility-Administered Medications  Medication Dose Route Frequency  Provider Last Rate Last Dose  . alum & mag hydroxide-simeth (MAALOX/MYLANTA) 200-200-20 MG/5ML suspension 30 mL  30 mL Oral Q4H PRN Patrecia Pour, NP   30 mL at 03/16/15 0838  . cloNIDine (CATAPRES) tablet 0.1 mg  0.1 mg Oral BH-qamhs Patrecia Pour, NP   0.1 mg at 03/16/15 9509   Followed by  . [START ON 03/18/2015] cloNIDine (CATAPRES) tablet 0.1 mg  0.1 mg Oral QAC breakfast Patrecia Pour, NP      . dicyclomine (BENTYL) tablet 20 mg  20 mg Oral Q6H PRN Patrecia Pour, NP   20 mg at 03/15/15 1253  . FLUoxetine (PROZAC) capsule 20 mg  20 mg Oral Daily Jenne Campus, MD   20 mg at 03/16/15 3267  . hydrOXYzine (ATARAX/VISTARIL) tablet 25 mg  25 mg Oral Q6H PRN Patrecia Pour, NP   25 mg at 03/15/15 2118  . loperamide (IMODIUM) capsule 2-4 mg  2-4 mg Oral PRN Patrecia Pour, NP   4 mg at 03/15/15 2118  . magnesium hydroxide (MILK OF MAGNESIA) suspension 30 mL  30 mL Oral Daily PRN Patrecia Pour, NP      . methocarbamol (ROBAXIN) tablet 500 mg  500 mg Oral Q8H PRN Patrecia Pour, NP   500 mg at 03/16/15 1245  . naproxen (NAPROSYN) tablet 500 mg  500 mg Oral BID PRN Patrecia Pour, NP   500 mg at 03/13/15 2114  . ondansetron (ZOFRAN-ODT) disintegrating tablet 4 mg  4 mg Oral Q8H PRN Patrecia Pour, NP   4 mg at 03/13/15 2114  . traZODone (DESYREL) tablet 50 mg  50 mg Oral QHS PRN Jenne Campus, MD   50 mg at 03/15/15 2118    Lab Results:  Results for orders placed or performed during the hospital encounter of 03/13/15 (from the past 48 hour(s))  Hepatitis C antibody     Status: None   Collection Time: 03/14/15  6:25 PM  Result Value Ref Range   HCV Ab <0.1 0.0 - 0.9 s/co ratio    Comment: (NOTE)                                  Negative:     < 0.8                             Indeterminate: 0.8 - 0.9                                  Positive:     > 0.9 The CDC recommends that a positive HCV antibody result be followed up with a HCV Nucleic Acid Amplification test (809983). Performed  At: [name] [place], [place] [id number] Lindon Romp MD [id number] Performed at [name]   Hepatitis B surface antigen     Status: None   Collection Time: 03/14/15  6:25 PM  Result Value Ref Range   Hepatitis B Surface Ag Negative Negative    Comment: (NOTE) Performed At: [name] [place], [place] [id number] Lindon Romp MD [id number] Performed at [name]   Glucose, capillary     Status: None   Collection Time: 03/15/15  6:15 AM  Result Value Ref Range   Glucose-Capillary 85 65 - 99 mg/dL  Hepatic function panel     Status: Abnormal   Collection Time: 03/15/15  6:42 AM  Result Value Ref Range   Total Protein 7.4 6.5 - 8.1 g/dL   Albumin 4.3 3.5 - 5.0 g/dL   AST 56 (H) 15 - 41 U/L   ALT 90 (H) 17 - 63 U/L   Alkaline Phosphatase 78 38 - 126 U/L   Total Bilirubin 0.6 0.3 - 1.2 mg/dL   Bilirubin, Direct <0.1 (L) 0.1 - 0.5 mg/dL   Indirect Bilirubin NOT CALCULATED 0.3 - 0.9 mg/dL    Comment: Performed at [facility]  CBC with Differential/Platelet     Status: Abnormal   Collection Time: 03/15/15  6:42 AM  Result Value Ref Range   WBC 10.9 (H) 4.0 - 10.5 K/uL   RBC 5.17 4.22 - 5.81 MIL/uL   Hemoglobin 14.6 13.0 - 17.0 g/dL   HCT 45.8 39.0 - 52.0 %   MCV 88.6 78.0 - 100.0 fL   MCH 28.2 26.0 - 34.0 pg   MCHC 31.9 30.0 - 36.0 g/dL   RDW 12.1 11.5 - 15.5 %   Platelets 277 150 - 400 K/uL   Neutrophils Relative % 70 %   Neutro Abs 7.6 1.7 - 7.7 K/uL   Lymphocytes Relative 23 %   Lymphs Abs 2.5 0.7 - 4.0 K/uL   Monocytes Relative 6 %   Monocytes Absolute 0.7 0.1 - 1.0 K/uL   Eosinophils Relative 1 %   Eosinophils Absolute 0.1 0.0 - 0.7 K/uL   Basophils Relative 0 %   Basophils Absolute 0.0 0.0 - 0.1 K/uL    Comment: Performed at [facility]    Physical Findings: AIMS: Facial and Oral Movements Muscles of Facial Expression:  None, normal Lips and Perioral Area: None, normal Jaw: None, normal Tongue: None, normal,Extremity Movements Upper (arms, wrists, hands, fingers): None, normal Lower (legs, knees, ankles, toes): None, normal, Trunk Movements Neck, shoulders, hips: None, normal, Overall Severity Severity of abnormal movements (highest score from questions above): None, normal Incapacitation due to abnormal movements: None, normal Patient's awareness of abnormal movements (rate only patient's report): No Awareness, Dental Status Current problems with teeth and/or dentures?: No Does patient usually wear dentures?: No  CIWA:  CIWA-Ar Total: 7 COWS:  COWS Total Score: 4  Musculoskeletal: Strength & Muscle Tone: within normal limits- no tremors, no diaphoresis, no acute distress or restlessness  Gait & Station: normal Patient leans: N/A  Psychiatric Specialty Exam: ROS at this time does not endorse chest pain or shortness of breath, (+) nausea but no vomiting, (+) diarrhea   Blood pressure 131/87, pulse 101, temperature 98.3 F (36.8 C), temperature source Oral, resp. rate 16, height _0  (1.702 m), weight 360 lb (163.295 kg).Body mass index is 56.37 kg/(m^2).  General Appearance: Fairly Groomed  Engineer, water::   Improved   Speech:  Normal Rate  Volume:  Normal  Mood:  Less depressed  Affect:  Less constricted   Thought Process:  Linear  Orientation:  Other:  fully alert and attentive   Thought Content:   Today does not endorse hallucinations and does not appear internally preoccupied, no delusions expressed   Suicidal Thoughts:  No currently denies any suicidal ideations and contracts for safety on unit   Homicidal Thoughts:  No  Memory:  recent and remote grossly intact  Judgement:  Fair  Activity level improving- more visible on unit     Concentration:  Good  Recall:  Good  Fund of Knowledge:Good  Language: Good  Akathisia:  Negative  Handed:  Right  AIMS (if indicated):     Assets:   Desire for Improvement Resilience  ADL's:  Intact  Cognition: WNL  Sleep:  Number of Hours: 6.5  Assessment - symptoms of opiate WDL are improving gradually - at this time not in any acute distress or discomfort. Depression partially improved, but still feels sad/depressed, although reports significant improvement compared to admission. Future oriented and focused on going to Rehab setting after discharge. Tolerating medications well thus far .  Treatment Plan Summary: Daily contact with patient to assess and evaluate symptoms and progress in treatment, Medication management, Plan inpatient treatment  and medications as below Encourage increased milieu and group participation to work on coping skills and symptom reduction Continue to encourage efforts to remain sober and focus on recovery- at this time patient interested in going to a Rehab after discharge Continue Opiate Detox Protocol with Clonidine .  Continue Trazodone at 50 mgrs QHS PRN for insomnia  Continue Prozac 20 mgrs QDAY for depression Continue Vistaril 25 mgrs Q 6 hours PRN for anxiety  Neita Garnet, MD 03/16/2015, 3:52 PM

## 2015-03-16 NOTE — Progress Notes (Signed)
Adult Psychoeducational Group Note  Date:  03/16/2015 Time:  9:29 PM  Group Topic/Focus:  Wrap-Up Group:   The focus of this group is to help patients review their daily goal of treatment and discuss progress on daily workbooks.  Participation Level:  Active  Participation Quality:  Appropriate  Affect:  Appropriate  Cognitive:  Alert  Insight: Appropriate  Engagement in Group:  Engaged  Modes of Intervention:  Discussion  Additional Comments:  Pt stated that he and a good day and was able to attend all groups. His goal for tomorrow is to stay positive and focus on discharge Friday. One positive coping skill he has is to talk to family.   Kaleen Odea R 03/16/2015, 9:29 PM

## 2015-03-16 NOTE — Progress Notes (Signed)
Patient ID: Miguel [name], male   DOB: 06-03-1990, 25 y.o.   MRN: [id number]  DAR: Pt. Denies SI/HI and A/V Hallucinations. He reports sleep is fair, appetite is good, energy level is low, and concentration is good. He rates depression 2/10, hopelessness 0/10, and anxiety 2/10. Patient does report mild pain is receiving PRN Robaxin which is providing some relief. Support and encouragement provided to the patient. Scheduled medications administered to patient per physician's orders. Patient is minimal with Probation officer but able to get his needs met. Patient shaved earlier today. He continues to report mild withdrawal symptoms. Q15 minute checks are maintained for safety.

## 2015-03-16 NOTE — [facility] Group Notes (Signed)
[facility] LCSW Aftercare Discharge Planning Group Note  03/16/2015 8:45 AM  Participation Quality: Alert, Appropriate and Oriented  Mood/Affect: Flat  Depression Rating: 1  Anxiety Rating: 1  Thoughts of Suicide: Pt denies SI/HI  Will you contract for safety? Yes  Current AVH: Pt denies  Plan for Discharge/Comments: Pt attended discharge planning group and actively participated in group. CSW discussed suicide prevention education with the group and encouraged them to discuss discharge planning and any relevant barriers. Pt reports he is "back down to normal" today and expresses that he is hopeful to be involved with [employer]. Program at discharge.  Transportation Means: Pt reports access to transportation  Supports: No supports mentioned at this time  Chad Cordial, LCSWA 03/16/2015 9:48 AM

## 2015-03-16 NOTE — Progress Notes (Signed)
Patient ID: Miguel [name], male   DOB: [birth year], 25 y.o.   MRN: [id number] D: Patient alert and cooperative. Pt detoxing c/o tremors, anxiety, and diarrhea. Pt mood/affect is anxious. Pt denies SI/HI/AVH. No acute distressed noted.  A: Medications administered as prescribed. Emotional support given and will continue to monitor pt's progress for stabilization. R: Patient remains safe and complaint with medications.

## 2015-03-16 NOTE — Progress Notes (Signed)
Recreation Therapy Notes  Date: 02.17.2017 Time: 9:30am Location: [address] Group Room   Group Topic: Stress Management  Goal Area(s) Addresses:  Patient will actively participate in stress management techniques presented during session.   Behavioral Response: Did not attend.    Marykay Lex Kallan Bischoff, LRT/CTRS        Fatou Dunnigan L 03/16/2015 2:07 PM

## 2015-03-16 NOTE — [facility] Group Notes (Signed)
[facility] LCSW Group Therapy 03/16/2015 1:15 PM  Type of Therapy: Group Therapy- Emotion Regulation  Participation Level: Minimal  Participation Quality:  Appropriate  Affect: Appropriate  Cognitive: Alert and Oriented   Insight:  Developing/Improving  Engagement in Therapy: Developing/Improving and Engaged   Modes of Intervention: Clarification, Confrontation, Discussion, Education, Exploration, Limit-setting, Orientation, Problem-solving, Rapport Building, Dance movement psychotherapist, Socialization and Support  Summary of Progress/Problems: The topic for group today was emotional regulation. This group focused on both positive and negative emotion identification and allowed group members to process ways to identify feelings, regulate negative emotions, and find healthy ways to manage internal/external emotions. Group members were asked to reflect on a time when their reaction to an emotion led to a negative outcome and explored how alternative responses using emotion regulation would have benefited them. Group members were also asked to discuss a time when emotion regulation was utilized when a negative emotion was experienced. Pt identified anger as a difficult emotion to regulate. He participated minimally in discussion and continues to appear withdrawn in the group setting.   Chad Cordial, LCSWA 03/16/2015 4:10 PM

## 2015-03-17 DIAGNOSIS — F332 Major depressive disorder, recurrent severe without psychotic features: Secondary | ICD-10-CM

## 2015-03-17 NOTE — Progress Notes (Signed)
Patient ID: Miguel [name], male   DOB: 11-25-1990, 25 y.o.   MRN: [id number] D: Patient alert and cooperative. Pt reports plans after discharge to go to a long term rehab in [place]. Pt reports tolerating medication well. Pt denies SI/HI/AVH. No acute distressed noted.  A: Medications administered as prescribed. Emotional support given and will continue to monitor pt's progress for stabilization. R: Patient remains safe and complaint with medications.

## 2015-03-17 NOTE — [facility] Group Notes (Signed)
[facility] Mental Health Association Group Therapy 03/17/2015 1:15pm  Type of Therapy: Mental Health Association Presentation  Participation Level: Active  Participation Quality: Attentive  Affect: Appropriate  Cognitive: Oriented  Insight: Developing/Improving  Engagement in Therapy: Engaged  Modes of Intervention: Discussion, Education and Socialization  Summary of Progress/Problems: Mental Health Association (MHA) Speaker came to talk about his personal journey with substance abuse and addiction. The pt processed ways by which to relate to the speaker. MHA speaker provided handouts and educational information pertaining to groups and services offered by the [facility]. Pt was engaged in speaker's presentation and was receptive to resources provided.    Chad Cordial, LCSWA 03/17/2015 1:47 PM

## 2015-03-17 NOTE — Progress Notes (Signed)
Patient ID: Miguel [name], male   DOB: 02-13-1990, 25 y.o.   MRN: [id number] [facility] MD Progress Note  03/17/2015 5:10 PM Miguel [name]  MRN:  [id number] Subjective:    Patient reports gradual improvement compared to his admission. At this time describing subsiding symptoms of opiate withdrawal- states that abdominal discomfort and diarrhea have essentially resolved. He does endorse cravings for opiates, but reports a high level of motivation in sobriety. He has been working on disposition plans and has expressed interest in going to an inpatient rehab after discharge . Denies medication side effects .   Objective : I have discussed case with treatment team and have met with patient . Currently patient is improved compared to admission- still presents somewhat depressed, but affect is improved , and states he is feeling better. No significant  Opiate withdrawal symptoms at this time- completing Clonidine taper without side effects. No medication side effects at this time. No disruptive behaviors on unit, going to groups . As above, currently focused on going to further residential rehab treatment after discharge   Principal Problem: Major depressive disorder, single episode, severe without psychotic features ([place]) Diagnosis:   Patient Active Problem List   Diagnosis Date Noted  . Polysubstance dependence including opioid type drug with complication, continuous use ([place]) [F19.20] 03/14/2015  . Major depressive disorder, single episode, severe without psychotic features ([place]) [F32.2] 03/13/2015  . Cocaine use disorder, moderate, dependence ([place]) [F14.20] 09/22/2014  . Tobacco use disorder [F17.200] 09/22/2014  . Sedative, hypnotic or anxiolytic use disorder, severe, dependence ([place]) [F13.20] 09/22/2014  . HTN (hypertension) [I10] 09/21/2014  . Suicide attempt ([place]) [T14.91] 09/19/2014  . Nightmares [F51.5]   . PTSD (post-traumatic stress disorder) [F43.10] 06/11/2014  . Severe  recurrent major depression without psychotic features ([facility]) [F33.2] 06/11/2014   Total Time spent with patient: 20 minutes    Past Medical History:  Past Medical History  Diagnosis Date  . Hand fracture, right 2008    no surgery required  . Hand fracture, left 2010  . Depression   . Schizophrenia ([place])   . Bipolar 1 disorder [facility])     Past Surgical History  Procedure Laterality Date  . Left hand fracture Left 2010    Dr Ninfa Linden  . Cyst excision     Family History:  Family History  Problem Relation Age of Onset  . Bipolar disorder Mother   . Schizophrenia Mother   . Schizophrenia Sister     Social History:  History  Alcohol Use No    Comment: He drank this time in effort to hurt self/ usually non drinker     History  Drug Use No    Social History   Social History  . Marital Status: Single    Spouse Name: N/A  . Number of Children: N/A  . Years of Education: N/A   Social History Main Topics  . Smoking status: Never Smoker   . Smokeless tobacco: Never Used  . Alcohol Use: No     Comment: He drank this time in effort to hurt self/ usually non drinker  . Drug Use: No  . Sexual Activity: Yes    Birth Control/ Protection: Other-see comments     Comment: condom   Other Topics Concern  . None   Social History Narrative   Additional Social History:   Sleep: improved   Appetite:  Good  Current Medications: Current Facility-Administered Medications  Medication Dose Route Frequency Provider Last Rate Last Dose  . alum &  mag hydroxide-simeth (MAALOX/MYLANTA) 200-200-20 MG/5ML suspension 30 mL  30 mL Oral Q4H PRN Patrecia Pour, NP   30 mL at 03/16/15 0838  . [START ON 03/18/2015] cloNIDine (CATAPRES) tablet 0.1 mg  0.1 mg Oral QAC breakfast Patrecia Pour, NP   0.1 mg at 03/17/15 0936  . dicyclomine (BENTYL) tablet 20 mg  20 mg Oral Q6H PRN Patrecia Pour, NP   20 mg at 03/15/15 1253  . FLUoxetine (PROZAC) capsule 20 mg  20 mg Oral Daily Jenne Campus, MD    20 mg at 03/17/15 0937  . hydrOXYzine (ATARAX/VISTARIL) tablet 25 mg  25 mg Oral Q6H PRN Patrecia Pour, NP   25 mg at 03/16/15 1641  . loperamide (IMODIUM) capsule 2-4 mg  2-4 mg Oral PRN Patrecia Pour, NP   4 mg at 03/15/15 2118  . magnesium hydroxide (MILK OF MAGNESIA) suspension 30 mL  30 mL Oral Daily PRN Patrecia Pour, NP      . methocarbamol (ROBAXIN) tablet 500 mg  500 mg Oral Q8H PRN Patrecia Pour, NP   500 mg at 03/16/15 1641  . naproxen (NAPROSYN) tablet 500 mg  500 mg Oral BID PRN Patrecia Pour, NP   500 mg at 03/13/15 2114  . ondansetron (ZOFRAN-ODT) disintegrating tablet 4 mg  4 mg Oral Q8H PRN Patrecia Pour, NP   4 mg at 03/13/15 2114  . traZODone (DESYREL) tablet 50 mg  50 mg Oral QHS PRN,MR X 1 Spencer E Simon, PA-C   50 mg at 03/16/15 2256    Lab Results:  No results found for this or any previous visit (from the past 48 hour(s)).  Physical Findings: AIMS: Facial and Oral Movements Muscles of Facial Expression: None, normal Lips and Perioral Area: None, normal Jaw: None, normal Tongue: None, normal,Extremity Movements Upper (arms, wrists, hands, fingers): None, normal Lower (legs, knees, ankles, toes): None, normal, Trunk Movements Neck, shoulders, hips: None, normal, Overall Severity Severity of abnormal movements (highest score from questions above): None, normal Incapacitation due to abnormal movements: None, normal Patient's awareness of abnormal movements (rate only patient's report): No Awareness, Dental Status Current problems with teeth and/or dentures?: No Does patient usually wear dentures?: No  CIWA:  CIWA-Ar Total: 7 COWS:  COWS Total Score: 0  Musculoskeletal: Strength & Muscle Tone: within normal limits- no tremors, no diaphoresis, no acute distress or restlessness  Gait & Station: normal Patient leans: N/A  Psychiatric Specialty Exam: ROS no vomiting, no nausea, diarrhea resolving   Blood pressure 145/85, pulse 100, temperature 98.3 F  (36.8 C), temperature source Oral, resp. rate 18, height '5\' 7"'$  (1.702 m), weight 360 lb (163.295 kg).Body mass index is 56.37 kg/(m^2).  General Appearance:  Improved grooming   Eye Contact::   Improved   Speech:  Normal Rate  Volume:  Normal  Mood:  Less depressed   Affect:   More reactive affect, smiles at times appropriately  Thought Process:  Linear  Orientation:  Other:  fully alert and attentive   Thought Content:   Today does not endorse hallucinations and does not appear internally preoccupied, no delusions expressed   Suicidal Thoughts:  No currently denies any suicidal ideations and contracts for safety on unit   Homicidal Thoughts:  No  Memory:  recent and remote grossly intact  Judgement:  Fair  Activity level improving- more visible on unit     Concentration:  Good  Recall:  [place] of Knowledge:Good  Language: Good  Akathisia:  Negative  Handed:  Right  AIMS (if indicated):     Assets:  Desire for Improvement Resilience  ADL's:  Intact  Cognition: WNL  Sleep:  Number of Hours: 6.5  Assessment - patient is improving compared to admission, at present no ongoing significant  symptoms of opiate withdrawal . His mood is also improved and he is looking forward to discharging to a residential rehab. Tolerating Prozac well . Treatment Plan Summary: Daily contact with patient to assess and evaluate symptoms and progress in treatment, Medication management, Plan inpatient treatment  and medications as below Encourage increased milieu and group participation to work on coping skills and symptom reduction Continue to encourage efforts to remain sober and focus on recovery- at this time patient interested in going to a Rehab after discharge Continue Trazodone at 50 mgrs QHS PRN for insomnia  Continue Prozac 20 mgrs QDAY for depression Continue Vistaril 25 mgrs Q 6 hours PRN for anxiety Treatment team working on disposition planning as above   Neita Garnet, MD 03/17/2015,  5:10 PM

## 2015-03-17 NOTE — [facility] Suicide Risk Assessment (Signed)
[facility] INPATIENT:  Family/Significant Other Suicide Prevention Education  Suicide Prevention Education:  Education Completed; Keniel Ralston, Pt's mother [phone], has been identified by the patient as the family member/significant other with whom the patient will be residing, and identified as the person(s) who will aid the patient in the event of a mental health crisis (suicidal ideations/suicide attempt).  With written consent from the patient, the family member/significant other has been provided the following suicide prevention education, prior to the and/or following the discharge of the patient.  The suicide prevention education provided includes the following:  Suicide risk factors  Suicide prevention and interventions  National Suicide Hotline telephone number  [facility] assessment telephone number  [facility] Emergency Assistance 911  [facility] and/or Residential Mobile Crisis Unit telephone number  Request made of family/significant other to:  Remove weapons (e.g., guns, rifles, knives), all items previously/currently identified as safety concern.    Remove drugs/medications (over-the-counter, prescriptions, illicit drugs), all items previously/currently identified as a safety concern.  The family member/significant other verbalizes understanding of the suicide prevention education information provided.  The family member/significant other agrees to remove the items of safety concern listed above.  Elaina Hoops 03/17/2015, 8:51 AM

## 2015-03-17 NOTE — Progress Notes (Signed)
Patient ID: Miguel [name], male   DOB: [birth year], 25 y.o.   MRN: [id number] D: Client visible on unit, in dayroom watching TV, "everything all good" Client reports "I'm leaving tomorrow going to long term treatment" Client reports this admission has been helpful "the groups and support" "they been keeping up with medications and everything there when I need it to be." A: Writer provided emotional support, encouraged client continue towards recovery. Medications reviewed, administered as ordered. Staff will monitor q76min for safety. R: Client is safe on the unit.

## 2015-03-17 NOTE — [facility] Group Notes (Signed)
[facility] Group Notes:  (Nursing/MHT/Case Management/Adjunct)  Date:  03/17/2015  Time:  0930  Type of Therapy:  Nurse Education  Participation Level:  Did Not Attend  Participation Quality:    Affect:    Cognitive:    Insight:    Engagement in Group:    Modes of Intervention:    Summary of Progress/Problems:  Lemya Greenwell Hundley 03/17/2015, 0930 

## 2015-03-17 NOTE — Plan of Care (Signed)
Problem: Alteration in mood & ability to function due to Goal: STG-Patient will comply with prescribed medication regimen (Patient will comply with prescribed medication regimen)  Outcome: Progressing Pt compliant with medication regime     

## 2015-03-17 NOTE — Progress Notes (Signed)
Patient ID: Debby Freiberg, male   DOB: 02-Jul-1990, 25 y.o.   MRN: [id number]  D: Patient has been in the bed all morning. Reports poor sleep. Reports some mood improvement and contracts for safety. No active SI this am. No pain issues. Minimal withdrawal symptoms noted since patient is in the bed. A: Staff will monitor on q 15 minute checks, follow treatment plan and receive medications as ordered. R: Cooperative at this time on the unit. Taking med without issue

## 2015-03-18 MED ORDER — HYDROXYZINE HCL 25 MG PO TABS: 25.0000 mg | ORAL_TABLET | Freq: Four times a day (QID) | ORAL | Status: DC | PRN

## 2015-03-18 MED ORDER — TRAZODONE HCL 50 MG PO TABS: 50.0000 mg | ORAL_TABLET | Freq: Every evening | ORAL | Status: DC | PRN

## 2015-03-18 MED ORDER — FLUOXETINE HCL 20 MG PO CAPS: 20.0000 mg | ORAL_CAPSULE | Freq: Every day | ORAL | Status: DC

## 2015-03-18 NOTE — Tx Team (Signed)
Interdisciplinary Treatment Plan Update (Adult) Date: 03/18/2015   Date: 03/18/2015 10:21 AM  Progress in Treatment:  Attending groups: Yes Participating in groups:  Yes Taking medication as prescribed: Yes  Tolerating medication: Yes  Family/Significant othe contact made:Yes with mother Patient understands diagnosis: Yes AEB seeking help with depression and substance abuse Discussing patient identified problems/goals with staff: Yes  Medical problems stabilized or resolved: Yes  Denies suicidal/homicidal ideation: Yes Patient has not harmed self or Others: Yes   New problem(s) identified: None identified at this time.   Discharge Plan or Barriers: CSW will assess for appropriate discharge plan and relevant barriers.   03/18/15: Pt plans to discharge home while waiting for an opening at [facility]  Additional comments:  Patient and CSW reviewed pt's identified goals and treatment plan. Patient verbalized understanding and agreed to treatment plan. CSW reviewed [facility] "Discharge Process and Patient Involvement" Form. Pt verbalized understanding of information provided and signed form.   Reason for Continuation of Hospitalization:  Depression Medication stabilization Suicidal ideation Withdrawal symptoms  Estimated length of stay: 0 days  Review of initial/current patient goals per problem list:   1.  Goal(s): Patient will participate in aftercare plan  Met:  Yes  Target date: 3-5 days from date of admission   As evidenced by: Patient will participate within aftercare plan AEB aftercare provider and housing plan at discharge being identified.  03/14/15: CSW to work with Pt to assess for appropriate discharge plan and faciliate appointments and referrals as needed prior to d/c. 03/18/15: Pt plans to discharge home while waiting for an opening at [facility]  2.  Goal (s): Patient will exhibit decreased depressive symptoms and suicidal ideations.  Met:  Yes  Target date: 3-5 days from date  of admission   As evidenced by: Patient will utilize self rating of depression at 3 or below and demonstrate decreased signs of depression or be deemed stable for discharge by MD. 03/14/15: Pt was admitted with symptoms of depression, rating 10/10. Pt continues to present with flat affect and depressive symptoms.  Pt will demonstrate decreased symptoms of depression and rate depression at 3/10 or lower prior to discharge. 03/18/15: Pt rates depression at 1/10; denies SI  4.  Goal(s): Patient will demonstrate decreased signs of withdrawal due to substance abuse  Met:  Yes  Target date: 3-5 days from date of admission   As evidenced by: Patient will produce a CIWA/COWS score of 0, have stable vitals signs, and no symptoms of withdrawal  03/14/15: Pt COWS score of 4, CIWA of 7; Pt endorses GI upset, tremor, auditory disturbances, sweats, anxiety, headache, and joint aches as symptoms of withdrawal.  03/18/15: COWS score of 0; no signs of withdrawal noted.  Attendees:  Patient:    Family:    Physician: Dr. Parke Poisson, MD  03/18/2015 10:21 AM  Nursing: Lars Pinks, RN Case manager  03/18/2015 10:21 AM  Clinical Social Worker Peri Maris, [place] 03/18/2015 10:21 AM  Other: Tilden Fossa, LCSWA 03/18/2015 10:21 AM  Clinical:  Chuck Hint, RN; Idell Pickles, RN 03/18/2015 10:21 AM  Other: , RN Charge Nurse 03/18/2015 10:21 AM  Other: Hilda Lias, [place], [place] Social Work [phone]

## 2015-03-18 NOTE — Discharge Summary (Signed)
Physician Discharge Summary Note  Patient:  Miguel [name] is an 25 y.o., male MRN:  [id number] DOB:  August 03, 1990 Patient phone:  [phone] (home)  Patient address:   [address] [place] [address],  Total Time spent with patient: 30 minutes  Date of Admission:  03/13/2015 Date of Discharge: 03/18/2015  Reason for Admission:  depression  Principal Problem: Major depressive disorder, single episode, severe without psychotic features [facility]) Discharge Diagnoses: Patient Active Problem List   Diagnosis Date Noted  . Polysubstance dependence including opioid type drug with complication, continuous use (HCC) [F19.20] 03/14/2015  . Major depressive disorder, single episode, severe without psychotic features (HCC) [F32.2] 03/13/2015  . Cocaine use disorder, moderate, dependence (HCC) [F14.20] 09/22/2014  . Tobacco use disorder [F17.200] 09/22/2014  . Sedative, hypnotic or anxiolytic use disorder, severe, dependence (HCC) [F13.20] 09/22/2014  . HTN (hypertension) [I10] 09/21/2014  . Suicide attempt (HCC) [T14.91] 09/19/2014  . Nightmares [F51.5]   . PTSD (post-traumatic stress disorder) [F43.10] 06/11/2014  . Severe recurrent major depression without psychotic features (HCC) [F33.2] 06/11/2014    Past Psychiatric History:  See above noted  Past Medical History:  Past Medical History  Diagnosis Date  . Hand fracture, right 2008    no surgery required  . Hand fracture, left 2010  . Depression   . Schizophrenia (HCC)   . Bipolar 1 disorder [facility])     Past Surgical History  Procedure Laterality Date  . Left hand fracture Left 2010    Dr Magnus Ivan  . Cyst excision     Family History:  Family History  Problem Relation Age of Onset  . Bipolar disorder Mother   . Schizophrenia Mother   . Schizophrenia Sister    Family Psychiatric  History:  See above noted Social History:  History  Alcohol Use No    Comment: He drank this time in effort to hurt self/ usually non  drinker     History  Drug Use No    Social History   Social History  . Marital Status: Single    Spouse Name: N/A  . Number of Children: N/A  . Years of Education: N/A   Social History Main Topics  . Smoking status: Never Smoker   . Smokeless tobacco: Never Used  . Alcohol Use: No     Comment: He drank this time in effort to hurt self/ usually non drinker  . Drug Use: No  . Sexual Activity: Yes    Birth Control/ Protection: Other-see comments     Comment: condom   Other Topics Concern  . None   Social History Narrative    Hospital Course:  Miguel [name] was admitted for Major depressive disorder, single episode, severe without psychotic features (HCC) and crisis management.  He was treated with the following medications listed below .  Miguel [name] was discharged with current medication and was instructed on how to take medications as prescribed; (details listed below under Medication List).  Medical problems were identified and treated as needed.  Home medications were restarted as appropriate.  Improvement was monitored by observation and Miguel [name] daily report of symptom reduction.  Emotional and mental status was monitored by daily self-inventory reports completed by Miguel [name] and clinical staff.         Miguel [name] was evaluated by the treatment team for stability and plans for continued recovery upon discharge.  Miguel [name] motivation was an integral factor for scheduling further treatment.  Employment,  transportation, bed availability, health status, family support, and any pending legal issues were also considered during his hospital stay.  He was offered further treatment options upon discharge including but not limited to Residential, Intensive Outpatient, and Outpatient treatment.  Miguel [name] will follow up with the services as listed below under Follow Up Information.     Upon completion of this admission the  Miguel [name] was both mentally and medically stable for discharge denying suicidal/homicidal ideation, auditory/visual/tactile hallucinations, delusional thoughts and paranoia.     Physical Findings: AIMS: Facial and Oral Movements Muscles of Facial Expression: None, normal Lips and Perioral Area: None, normal Jaw: None, normal Tongue: None, normal,Extremity Movements Upper (arms, wrists, hands, fingers): None, normal Lower (legs, knees, ankles, toes): None, normal, Trunk Movements Neck, shoulders, hips: None, normal, Overall Severity Severity of abnormal movements (highest score from questions above): None, normal Incapacitation due to abnormal movements: None, normal Patient's awareness of abnormal movements (rate only patient's report): No Awareness, Dental Status Current problems with teeth and/or dentures?: No Does patient usually wear dentures?: No  CIWA:  CIWA-Ar Total: 0 COWS:  COWS Total Score: 0  Musculoskeletal: Strength & Muscle Tone: within normal limits Gait & Station: normal Patient leans: N/A  Psychiatric Specialty Exam: Review of Systems  All other systems reviewed and are negative.   Blood pressure 156/98, pulse 94, temperature 98.9 F (37.2 C), temperature source Oral, resp. rate 18, height  (1.702 m), weight 163.295 kg (360 lb).Body mass index is 56.37 kg/(m^2).  Have you used any form of tobacco in the last 30 days? (Cigarettes, Smokeless Tobacco, Cigars, and/or Pipes): No  Has this patient used any form of tobacco in the last 30 days? (Cigarettes, Smokeless Tobacco, Cigars, and/or Pipes) Yes, No  Blood Alcohol level:  Lab Results  Component Value Date   ETH <5 03/12/2015   ETH <5 09/19/2014    Metabolic Disorder Labs:  No results found for: HGBA1C, MPG No results found for: PROLACTIN No results found for: CHOL, TRIG, HDL, CHOLHDL, VLDL, LDLCALC  See Psychiatric Specialty Exam and Suicide Risk Assessment completed by Attending Physician  prior to discharge.  Discharge destination:  Home  Is patient on multiple antipsychotic therapies at discharge:  No   Has Patient had three or more failed trials of antipsychotic monotherapy by history:  No  Recommended Plan for Multiple Antipsychotic Therapies: NA     Medication List    TAKE these medications      Indication   FLUoxetine 20 MG capsule  Commonly known as:  PROZAC  Take 1 capsule (20 mg total) by mouth daily.   Indication:  Major Depressive Disorder     hydrOXYzine 25 MG tablet  Commonly known as:  ATARAX/VISTARIL  Take 1 tablet (25 mg total) by mouth every 6 (six) hours as needed (anxiety).   Indication:  Anxiety Neurosis     traZODone 50 MG tablet  Commonly known as:  DESYREL  Take 1 tablet (50 mg total) by mouth at bedtime as needed and may repeat dose one time if needed for sleep.   Indication:  Trouble Sleeping       Follow-up Information    Follow up with [facility].   Specialty:  Behavioral Health   Why:  You may walk-in between 8am-3pm Monday-Friday to be assessed for medication management and therapy.   Contact information:   [address] [place] [address] [phone]       Follow up with [facility].  Why:  Walk-In Assessment Clinic for therapy is every Monday & Wednesday from 10am to 2pm. [facility] for med management every Monday, Tuesday, & Thursday 9-11am and Wednesday 9-3pm   Contact information:   [address] [facility] [facility]. (located on the [address] route) Phone: [phone] Fax: [phone]      Follow-up recommendations:  Activity:  as tol Diet:  as tol  Comments:  1.  Take all your medications as prescribed.              2.  Report any adverse side effects to outpatient provider.                       3.  Patient instructed to not use alcohol or illegal drugs while on prescription medicines.            4.  In the event of worsening symptoms, instructed patient to call 911, the crisis hotline  or go to nearest emergency room for evaluation of symptoms.  Signed: Lindwood Qua, NP [facility] 03/18/2015, 10:32 AM   Patient seen, Suicide Assessment Completed.  Disposition Plan Reviewed

## 2015-03-18 NOTE — Progress Notes (Signed)
  [facility] Adult Case Management Discharge Plan :  Will you be returning to the same living situation after discharge:  No. Pt will stay with family until a bed is available at [facility] At discharge, do you have transportation home?: Yes,  Pt mother to pick up Do you have the ability to pay for your medications: Yes,  Pt provided with supply and prescriptions  Release of information consent forms completed and in the chart;  Patient's signature needed at discharge.  Patient to Follow up at: Follow-up Information    Follow up with [facility].   Specialty:  Behavioral Health   Why:  You may walk-in between 8am-3pm Monday-Friday to be assessed for medication management and therapy.   Contact information:   [address] [place] [address] [phone]       Follow up with [facility]   Why:  Walk-In Assessment Clinic for therapy is every Monday & Wednesday from 10am to 2pm. [facility] for med management every Monday, Tuesday, & Thursday 9-11am and Wednesday 9-3pm   Contact information:   [address] [facility]. (located on the [address] bus route) Phone: [phone] Fax: [phone]      Next level of care provider has access to [facility] Link:no  Safety Planning and Suicide Prevention discussed: Yes,  with mother; see SPE note for further details  Have you used any form of tobacco in the last 30 days? (Cigarettes, Smokeless Tobacco, Cigars, and/or Pipes): No  Has patient been referred to the Quitline?: N/A patient is not a smoker  Patient has been referred for addiction treatment: Yes- see above  Elaina Hoops 03/18/2015, 10:27 AM

## 2015-03-18 NOTE — BHH Group Notes (Signed)
[facility] LCSW Aftercare Discharge Planning Group Note  03/18/2015 8:45 AM  Pt did not attend, declined invitation.   Chad Cordial, LCSWA 03/18/2015 9:36 AM

## 2015-03-18 NOTE — BHH Suicide Risk Assessment (Addendum)
[facility] Discharge Suicide Risk Assessment   Principal Problem: Major depressive disorder, single episode, severe without psychotic features [facility]) Discharge Diagnoses:  Patient Active Problem List   Diagnosis Date Noted  . Polysubstance dependence including opioid type drug with complication, continuous use (HCC) [F19.20] 03/14/2015  . Major depressive disorder, single episode, severe without psychotic features (HCC) [F32.2] 03/13/2015  . Cocaine use disorder, moderate, dependence (HCC) [F14.20] 09/22/2014  . Tobacco use disorder [F17.200] 09/22/2014  . Sedative, hypnotic or anxiolytic use disorder, severe, dependence (HCC) [F13.20] 09/22/2014  . HTN (hypertension) [I10] 09/21/2014  . Suicide attempt (HCC) [T14.91] 09/19/2014  . Nightmares [F51.5]   . PTSD (post-traumatic stress disorder) [F43.10] 06/11/2014  . Severe recurrent major depression without psychotic features (HCC) [F33.2] 06/11/2014    Total Time spent with patient: 30 minutes  Musculoskeletal: Strength & Muscle Tone: within normal limits Gait & Station: normal Patient leans: N/A  Psychiatric Specialty Exam: ROS  Blood pressure 156/98, pulse 94, temperature 98.9 F (37.2 C), temperature source Oral, resp. rate 18, height  (1.702 m), weight 360 lb (163.295 kg).Body mass index is 56.37 kg/(m^2).  General Appearance: Well Groomed  Eye Contact::  Good  Speech:  Normal Rate409  Volume:  Normal  Mood:  improved mood, denies depression  Affect:  Appropriate  Thought Process:  Linear  Orientation:  Full (Time, Place, and Person)  Thought Content:  no hallucinations, no delusions - denies any current hallucinations and does not appear internally preoccupied   Suicidal Thoughts:  No- denies any suicidal ideations, denies any self injurious ideations   Homicidal Thoughts:  No  Memory:  recent and remote grossly intact   Judgement:  Other:  improved   Insight:  Present  Psychomotor Activity:  Normal  Concentration:  Good   Recall:  Good  Fund of Knowledge:Good  Language: Good  Akathisia:  NA  Handed:  Right  AIMS (if indicated):     Assets:  Communication Skills Desire for Improvement Resilience  Sleep:  Number of Hours: 4.25  Cognition: WNL  ADL's:  Intact   Mental Status Per Nursing Assessment::   On Admission:  Suicidal ideation indicated by patient, Suicide plan, Self-harm thoughts  Demographic Factors:  25 year old man, lives with roommate, currently unemployed  , no legal issues   Loss Factors: Death of his child,  2011-06-01  Historical Factors: History of depression, history of  Opiate dependence   Risk Reduction Factors:   Sense of responsibility to family, Living with another person, especially a relative and Positive coping skills or problem solving skills  Continued Clinical Symptoms:  At this time patient is improved compared to admission- mood improved, affect appropriate, no thought disorder, no SI , no HI, no psychotic symptoms, currently future oriented . No medication  side effects reported today.  No current significant WDL symptoms.   Cognitive Features That Contribute To Risk:  No gross cognitive deficits noted upon discharge. Is alert , attentive, and oriented x 3   Suicide Risk:  Mild:  Suicidal ideation of limited frequency, intensity, duration, and specificity.  There are no identifiable plans, no associated intent, mild dysphoria and related symptoms, good self-control (both objective and subjective assessment), few other risk factors, and identifiable protective factors, including available and accessible social support.  Follow-up Information    Follow up with [facility].   Specialty:  Behavioral Health   Why:  You may walk-in between 8am-3pm Monday-Friday to be assessed for medication management and therapy.   Contact information:  [address] [place] [address] [phone]       Follow up with [facility].   Why:  Walk-In Assessment Clinic for therapy  is every Monday & Wednesday from 10am to 2pm. [facility] for med management every Monday, Tuesday, & Thursday 9-11am and Wednesday 9-3pm   Contact information:   [address] [facility] (located on the [address] bus route) Phone: [phone] Fax: [phone]      Plan Of Care/Follow-up recommendations:  Activity:  as tolerated  Diet:  Regular Tests:  NA Other:  See below  Patient is leaving unit in good spirits. Follow up as above Plans to go to [facility]- a long term residential setting for recovery and relapse prevention.  Patient encouraged to follow up with PCP regarding monitoring LFTs periodically. Nehemiah Massed, MD 03/18/2015, 12:52 PM

## 2015-03-18 NOTE — Progress Notes (Signed)
Recreation Therapy Notes   Date: 02.17.2017 Time: 9:30am Location: [address] Group Room   Group Topic: Stress Management  Goal Area(s) Addresses:  Patient will actively participate in stress management techniques presented during session.   Behavioral Response: Did not attend.   Sondi Desch L Roxie Gueye, LRT/CTRS         Jacqulyn Barresi L 03/18/2015 3:01 PM 

## 2015-03-18 NOTE — Progress Notes (Signed)
Patient ID: Debby Freiberg, male   DOB: Jun 22, 1990, 25 y.o.   MRN: [id number] Patient discharged with plans to be admitted into a long term treatment facility.  Patient was accompanied by his mother upon discharge and was eager to go.  Patient stated that he is ready to get his life together and hopefully this is a start.  Patient was able to contract for safety upon discharge.

## 2016-02-08 ENCOUNTER — Encounter (HOSPITAL_COMMUNITY): Payer: Self-pay | Admitting: Emergency Medicine

## 2016-02-08 ENCOUNTER — Emergency Department (HOSPITAL_COMMUNITY): Payer: Self-pay

## 2016-02-08 ENCOUNTER — Emergency Department (HOSPITAL_COMMUNITY)
Admission: EM | Admit: 2016-02-08 | Discharge: 2016-02-08 | Disposition: A | Discharge status: Home / Self Care | Payer: Self-pay | Attending: Emergency Medicine | Admitting: Emergency Medicine

## 2016-02-08 DIAGNOSIS — Z79899 Other long term (current) drug therapy: Secondary | ICD-10-CM | POA: Insufficient documentation

## 2016-02-08 DIAGNOSIS — I10 Essential (primary) hypertension: Secondary | ICD-10-CM | POA: Insufficient documentation

## 2016-02-08 DIAGNOSIS — L03116 Cellulitis of left lower limb: Secondary | ICD-10-CM | POA: Insufficient documentation

## 2016-02-08 DIAGNOSIS — L539 Erythematous condition, unspecified: Secondary | ICD-10-CM | POA: Insufficient documentation

## 2016-02-08 DIAGNOSIS — R52 Pain, unspecified: Secondary | ICD-10-CM

## 2016-02-08 LAB — CBC WITH DIFFERENTIAL/PLATELET
BASOS PCT: 0 %
Basophils Absolute: 0 10*3/uL (ref 0.0–0.1)
EOS ABS: 0 10*3/uL (ref 0.0–0.7)
EOS PCT: 0 %
HCT: 46.8 % (ref 39.0–52.0)
HEMOGLOBIN: 16.2 g/dL (ref 13.0–17.0)
Lymphocytes Relative: 8 %
Lymphs Abs: 1.9 10*3/uL (ref 0.7–4.0)
MCH: 29.6 pg (ref 26.0–34.0)
MCHC: 34.6 g/dL (ref 30.0–36.0)
MCV: 85.4 fL (ref 78.0–100.0)
MONO ABS: 1.1 10*3/uL — AB (ref 0.1–1.0)
Monocytes Relative: 5 %
NEUTROS PCT: 87 %
Neutro Abs: 20.8 10*3/uL — ABNORMAL HIGH (ref 1.7–7.7)
PLATELETS: 187 10*3/uL (ref 150–400)
RBC: 5.48 MIL/uL (ref 4.22–5.81)
RDW: 12.7 % (ref 11.5–15.5)
WBC: 23.9 10*3/uL — ABNORMAL HIGH (ref 4.0–10.5)

## 2016-02-08 LAB — BASIC METABOLIC PANEL
Anion gap: 11 (ref 5–15)
BUN: 9 mg/dL (ref 6–20)
CALCIUM: 9.1 mg/dL (ref 8.9–10.3)
CO2: 23 mmol/L (ref 22–32)
CREATININE: 0.74 mg/dL (ref 0.61–1.24)
Chloride: 101 mmol/L (ref 101–111)
Glucose, Bld: 103 mg/dL — ABNORMAL HIGH (ref 65–99)
Potassium: 3.7 mmol/L (ref 3.5–5.1)
SODIUM: 135 mmol/L (ref 135–145)

## 2016-02-08 LAB — URINALYSIS, ROUTINE W REFLEX MICROSCOPIC
Bacteria, UA: NONE SEEN
Bilirubin Urine: NEGATIVE
GLUCOSE, UA: NEGATIVE mg/dL
Hgb urine dipstick: NEGATIVE
KETONES UR: 5 mg/dL — AB
NITRITE: NEGATIVE
PH: 5 (ref 5.0–8.0)
Protein, ur: 30 mg/dL — AB
Specific Gravity, Urine: 1.028 (ref 1.005–1.030)

## 2016-02-08 MED ORDER — CEPHALEXIN 500 MG PO CAPS: 500.0000 mg | ORAL_CAPSULE | Freq: Four times a day (QID) | ORAL | 0 refills | Status: DC

## 2016-02-08 MED ORDER — SULFAMETHOXAZOLE-TRIMETHOPRIM 800-160 MG PO TABS: 1.0000 | ORAL_TABLET | Freq: Two times a day (BID) | ORAL | 0 refills | Status: AC

## 2016-02-08 MED ORDER — CEPHALEXIN 250 MG PO CAPS
500.0000 mg | ORAL_CAPSULE | Freq: Once | ORAL | Status: AC
  Administered 2016-02-08: 500 mg via ORAL
  Filled 2016-02-08: qty 2

## 2016-02-08 MED ORDER — SULFAMETHOXAZOLE-TRIMETHOPRIM 800-160 MG PO TABS
1.0000 | ORAL_TABLET | Freq: Once | ORAL | Status: AC
  Administered 2016-02-08: 1 via ORAL
  Filled 2016-02-08: qty 1

## 2016-02-08 NOTE — ED Provider Notes (Signed)
[facility] Provider Note   CSN: [id number] Arrival date & time: 02/08/16  1537  By signing my name below, I, Miguel [name], attest that this documentation has been prepared under the direction and in the presence of Miguel Kaplan, MD . Electronically Signed: Teofilo [name], ED Scribe. 02/08/2016. 5:36 PM.   History   Chief Complaint Chief Complaint  Patient presents with  . Leg Pain  . Leg Swelling    The history is provided by the patient. No language interpreter was used.   HPI Comments:  Miguel [name] is a 26 y.o. male who presents to the Emergency Department complaining of constant left leg pain since yesterday. Pt states that his leg has become increasingly red and that the pain radiates all the way to his left testicle. Pt rates his pain at 7/10. Pt complains of associated lightheadedness, chills, and fever of 104 last night. Pt took tylenol at 0900 today with moderate relief. Pt denies injury/trauma, drug use, long travel, and denies hx of DVT/PE. Pt denies testicular swelling.   Past Medical History:  Diagnosis Date  . Bipolar 1 disorder (HCC)   . Depression   . Hand fracture, left 2010  . Hand fracture, right 2008   no surgery required  . Schizophrenia [facility]     Patient Active Problem List   Diagnosis Date Noted  . Polysubstance dependence including opioid type drug with complication, continuous use (HCC) 03/14/2015  . Major depressive disorder, single episode, severe without psychotic features (HCC) 03/13/2015  . Cocaine use disorder, moderate, 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  . Tobacco use disorder 09/22/2014  . Sedative, hypnotic or anxiolytic use disorder, severe, 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  . HTN (hypertension) 09/21/2014  . Suicide attempt 09/19/2014  . Nightmares   . PTSD (post-traumatic stress disorder) 06/11/2014  . 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/11/2014    Past Surgical History:  Procedure  Laterality Date  . CYST EXCISION    . left hand fracture Left 2010   Dr Magnus Ivan       Home Medications    Prior to Admission medications   Medication Sig Start Date End Date Taking? Authorizing Provider  cephALEXin (KEFLEX) 500 MG capsule Take 1 capsule (500 mg total) by mouth 4 (four) times daily. 02/08/16   Miguel Kaplan, MD  FLUoxetine (PROZAC) 20 MG capsule Take 1 capsule (20 mg total) by mouth daily. 03/18/15   Adonis Brook, NP  hydrOXYzine (ATARAX/VISTARIL) 25 MG tablet Take 1 tablet (25 mg total) by mouth every 6 (six) hours as needed (anxiety). 03/18/15   Adonis Brook, NP  sulfamethoxazole-trimethoprim (BACTRIM DS,SEPTRA DS) 800-160 MG tablet Take 1 tablet by mouth 2 (two) times daily. 02/08/16 02/15/16  Miguel Kaplan, MD  traZODone (DESYREL) 50 MG tablet Take 1 tablet (50 mg total) by mouth at bedtime as needed and may repeat dose one time if needed for sleep. 03/18/15   Adonis Brook, NP    Family History Family History  Problem Relation Age of Onset  . Bipolar disorder Mother   . Schizophrenia Mother   . Schizophrenia Sister     Social History Social History  Substance Use Topics  . Smoking status: Never Smoker  . Smokeless tobacco: Never Used  . Alcohol use No     Comment: He drank this time in effort to hurt self/ usually non drinker     Allergies   Tramadol and Vicodin [hydrocodone-acetaminophen]   Review of Systems Review of Systems  Constitutional: Positive for  chills and fever.  Genitourinary: Positive for testicular pain. Negative for penile swelling and scrotal swelling.  Musculoskeletal: Positive for myalgias.  Skin: Positive for color change.  Neurological: Positive for light-headedness.  All other systems reviewed and are negative.    Physical Exam Updated Vital Signs BP 136/98 (BP Location: Right Arm)   Pulse 95   Temp 98.2 F (36.8 C) (Oral)   Resp 17   Ht 5\' 6"  (1.676 m)   Wt (!) 374 lb 6 oz (169.8 kg)   SpO2 98%   BMI 60.43  kg/m   Physical Exam  Constitutional: He appears well-developed and well-nourished. No distress.  HENT:  Head: Normocephalic and atraumatic.  Eyes: Conjunctivae are normal.  Cardiovascular: Normal rate.   Pulmonary/Chest: Effort normal.  Abdominal: He exhibits no distension.  Genitourinary:  Genitourinary Comments: Testicles TTP. Bilateral testicle freely moving. No unilateral swelling, no erythema. Cremasteric reflex normal. No signs of hernia.   Neurological: He is alert.  Skin: Skin is warm and dry.  Psychiatric: He has a normal mood and affect.  Nursing note and vitals reviewed.              ED Treatments / Results  DIAGNOSTIC STUDIES:  Oxygen Saturation is 98% on RA, normal by my interpretation.    COORDINATION OF CARE:  5:36 PM Discussed treatment plan with pt at bedside and pt agreed to plan.   Labs (all labs ordered are listed, but only abnormal results are displayed) Labs Reviewed  CBC WITH DIFFERENTIAL/PLATELET - Abnormal; Notable for the following:       Result Value   WBC 23.9 (*)    Neutro Abs 20.8 (*)    Monocytes Absolute 1.1 (*)    All other components within normal limits  BASIC METABOLIC PANEL - Abnormal; Notable for the following:    Glucose, Bld 103 (*)    All other components within normal limits  URINALYSIS, ROUTINE W REFLEX MICROSCOPIC - Abnormal; Notable for the following:    Color, Urine AMBER (*)    APPearance HAZY (*)    Ketones, ur 5 (*)    Protein, ur 30 (*)    Leukocytes, UA TRACE (*)    Squamous Epithelial / LPF 0-5 (*)    All other components within normal limits    EKG  EKG Interpretation None       Radiology Dg Tibia/fibula Left  Result Date: 02/08/2016 CLINICAL DATA:  Fever last night with onset of left lower leg redness and pain today. Initial encounter. EXAM: LEFT TIBIA AND FIBULA - 2 VIEW COMPARISON:  None. FINDINGS: There is no evidence of fracture or other focal bone lesions. Soft tissues are unremarkable.  IMPRESSION: Negative exam. Electronically Signed   By: Miguel Kannerhomas  [name] M.D.   On: 02/08/2016 19:14   [place] Scrotum  Result Date: 02/08/2016 CLINICAL DATA:  Left testicular pain. EXAM: SCROTAL ULTRASOUND DOPPLER ULTRASOUND OF THE TESTICLES TECHNIQUE: Complete ultrasound examination of the testicles, epididymis, and other scrotal structures was performed. Color and spectral Doppler ultrasound were also utilized to evaluate blood flow to the testicles. COMPARISON:  None. FINDINGS: Right testicle Measurements: 4.9 x 2.3 x 2.9 cm. No mass or microlithiasis visualized. Left testicle Measurements: 4.8 x 2.0 x 3.1 cm. No mass or microlithiasis visualized. Right epididymis:  Normal in size and appearance. Left epididymis:  Normal in size and appearance. Hydrocele:  None visualized. Varicocele:  None visualized. Pulsed Doppler interrogation of both testes demonstrates normal low resistance arterial and venous waveforms bilaterally. IMPRESSION:  Unremarkable scrotal ultrasound. No findings to explain the patient's history of left testicular pain. Electronically Signed   By: Kennith Center M.D.   On: 02/08/2016 19:00   [place] Art/ven Flow Abd Pelv Doppler  Result Date: 02/08/2016 CLINICAL DATA:  Left testicular pain. EXAM: SCROTAL ULTRASOUND DOPPLER ULTRASOUND OF THE TESTICLES TECHNIQUE: Complete ultrasound examination of the testicles, epididymis, and other scrotal structures was performed. Color and spectral Doppler ultrasound were also utilized to evaluate blood flow to the testicles. COMPARISON:  None. FINDINGS: Right testicle Measurements: 4.9 x 2.3 x 2.9 cm. No mass or microlithiasis visualized. Left testicle Measurements: 4.8 x 2.0 x 3.1 cm. No mass or microlithiasis visualized. Right epididymis:  Normal in size and appearance. Left epididymis:  Normal in size and appearance. Hydrocele:  None visualized. Varicocele:  None visualized. Pulsed Doppler interrogation of both testes demonstrates normal low resistance arterial  and venous waveforms bilaterally. IMPRESSION: Unremarkable scrotal ultrasound. No findings to explain the patient's history of left testicular pain. Electronically Signed   By: Kennith Center M.D.   On: 02/08/2016 19:00    Procedures Procedures (including critical care time)  Medications Ordered in ED Medications  sulfamethoxazole-trimethoprim (BACTRIM DS,SEPTRA DS) 800-160 MG per tablet 1 tablet (1 tablet Oral Given 02/08/16 1901)  cephALEXin (KEFLEX) capsule 500 mg (500 mg Oral Given 02/08/16 1902)     Initial Impression / Assessment and Plan / ED Course  I have reviewed the triage vital signs and the nursing notes.  Pertinent labs & imaging results that were available during my care of the patient were reviewed by me and considered in my medical decision making (see chart for details).  Clinical Course as of Feb 07 1930  Wed Feb 08, 2016  1610 Results from the ER workup discussed with the patient face to face and all questions answered to the best of my ability.  Strict ER return precautions have been discussed, and patient is agreeing with the plan and is comfortable with the workup done and the recommendations from the ER.   [AN]    Clinical Course User Index [AN] Miguel Kaplan, MD   I personally performed the services described in this documentation, which was scribed in my presence. The recorded information has been reviewed and is accurate.  Pt has leg cellulitis. We will start bactrim and keflex. Pt is immunocompetent, and we have advised return to ER in 2 days for recheck. Pt has L testicular pain - [place] is neg. Exam of the testicles was neg.   Final Clinical Impressions(s) / ED Diagnoses   Final diagnoses:  Redness  Cellulitis of left lower extremity    New Prescriptions New Prescriptions   CEPHALEXIN (KEFLEX) 500 MG CAPSULE    Take 1 capsule (500 mg total) by mouth 4 (four) times daily.   SULFAMETHOXAZOLE-TRIMETHOPRIM (BACTRIM DS,SEPTRA DS) 800-160 MG TABLET     Take 1 tablet by mouth 2 (two) times daily.     Miguel Kaplan, MD 02/08/16 [phone]

## 2016-02-08 NOTE — ED Triage Notes (Signed)
Pt. Stated, yesterday I started having left leg pain all the way up to my testicle. No injury. Left leg is red and swollen.

## 2016-02-08 NOTE — ED Notes (Signed)
Pt returned from US
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
## 2020-05-21 ENCOUNTER — Other Ambulatory Visit: Payer: Self-pay

## 2020-05-21 ENCOUNTER — Ambulatory Visit (HOSPITAL_COMMUNITY)
Admission: EM | Admit: 2020-05-21 | Discharge: 2020-05-21 | Disposition: A | Discharge status: Home / Self Care | Payer: Medicaid Other | Attending: Student | Admitting: Student

## 2020-05-21 ENCOUNTER — Encounter (HOSPITAL_COMMUNITY): Payer: Self-pay

## 2020-05-21 DIAGNOSIS — N3090 Cystitis, unspecified without hematuria: Secondary | ICD-10-CM | POA: Insufficient documentation

## 2020-05-21 DIAGNOSIS — R300 Dysuria: Secondary | ICD-10-CM | POA: Diagnosis present

## 2020-05-21 LAB — POCT URINALYSIS DIPSTICK, ED / UC
Glucose, UA: 250 mg/dL — AB
Nitrite: POSITIVE — AB
Protein, ur: 100 mg/dL — AB
Specific Gravity, Urine: 1.01 (ref 1.005–1.030)
Urobilinogen, UA: >=8 mg/dL (ref 0.0–1.0)
pH: 5 (ref 5.0–8.0)

## 2020-05-21 MED ORDER — SULFAMETHOXAZOLE-TRIMETHOPRIM 800-160 MG PO TABS: 1.0000 | ORAL_TABLET | Freq: Two times a day (BID) | ORAL | 0 refills | Status: AC

## 2020-05-21 NOTE — ED Triage Notes (Signed)
Pt present urinary frequency with cloudy urine. Pt state he is have pain in his groin area and after urinating it burns. Pt state he notice blood in his urine. Symptom started a week ago.

## 2020-05-21 NOTE — ED Provider Notes (Signed)
[facility]    CSN: [id number] Arrival date & time: 05/21/20  1012      History   Chief Complaint Chief Complaint  Patient presents with  . Urinary Tract Infection    HPI Miguel [name] is a 30 y.o. male.   Patient presents today with a 1 week history of dysuria and bilateral groin pain.  He denies any penile discharge, flank pain, fever, chest pain, shortness of breath.  He was experiencing some nausea with associated lightheadedness but this has resolved.  He has been taking cranberry and Azo without improvement of symptoms.  He denies history of UTI or nephrolithiasis.  Denies any recent urogenital procedure, single kidney, self-catheterization.  He denies any concern for STI but is open to testing.  He has a history of gonorrhea when he was in high school denies any recent STI and reports completing treatment with negative test of cure following this time.  He reports pain is rated 9 on a 0-10 pain scale, localized to suprapubic region and penis with micturition, described as burning/intense aching, no alleviating factors identified.     Past Medical History:  Diagnosis Date  . Bipolar 1 disorder (HCC)   . Depression   . Hand fracture, left 2010  . Hand fracture, right 2008   no surgery required  . Schizophrenia [facility])     Patient Active Problem List   Diagnosis Date Noted  . Polysubstance dependence including opioid type drug with complication, continuous use (HCC) 03/14/2015  . Major depressive disorder, single episode, severe without psychotic features (HCC) 03/13/2015  . Cocaine use disorder, moderate, 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  . Tobacco use disorder 09/22/2014  . Sedative, hypnotic or anxiolytic use disorder, severe, 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  . HTN (hypertension) 09/21/2014  . Suicide attempt (HCC) 09/19/2014  . Nightmares   . PTSD (post-traumatic stress disorder) 06/11/2014  . Severe recurrent major depression without psychotic features  (HCC) 06/11/2014    Past Surgical History:  Procedure Laterality Date  . CYST EXCISION    . left hand fracture Left 2010   Dr Magnus Ivan       Home Medications    Prior to Admission medications   Medication Sig Start Date End Date Taking? Authorizing Provider  sulfamethoxazole-trimethoprim (BACTRIM DS) 800-160 MG tablet Take 1 tablet by mouth 2 (two) times daily for 7 days. 05/21/20 05/28/20 Yes Veryl Abril, Denny Peon K, PA-C  cephALEXin (KEFLEX) 500 MG capsule Take 1 capsule (500 mg total) by mouth 4 (four) times daily. 02/08/16   Derwood Kaplan, MD  FLUoxetine (PROZAC) 20 MG capsule Take 1 capsule (20 mg total) by mouth daily. 03/18/15   Adonis Brook, NP  hydrOXYzine (ATARAX/VISTARIL) 25 MG tablet Take 1 tablet (25 mg total) by mouth every 6 (six) hours as needed (anxiety). 03/18/15   Adonis Brook, NP  traZODone (DESYREL) 50 MG tablet Take 1 tablet (50 mg total) by mouth at bedtime as needed and may repeat dose one time if needed for sleep. 03/18/15   Adonis Brook, NP    Family History Family History  Problem Relation Age of Onset  . Bipolar disorder Mother   . Schizophrenia Mother   . Schizophrenia Sister     Social History Social History   Tobacco Use  . Smoking status: Never Smoker  . Smokeless tobacco: Never Used  Substance Use Topics  . Alcohol use: No    Comment: He drank this time in effort to hurt self/ usually non drinker  . Drug use: No  Allergies   Tramadol and Vicodin [hydrocodone-acetaminophen]   Review of Systems Review of Systems  Constitutional: Positive for activity change. Negative for appetite change, fatigue and fever.  Respiratory: Negative for cough and shortness of breath.   Cardiovascular: Negative for chest pain.  Gastrointestinal: Positive for abdominal pain and nausea. Negative for diarrhea and vomiting.  Genitourinary: Positive for dysuria, frequency, hematuria and urgency. Negative for flank pain, penile discharge and penile pain.   Musculoskeletal: Negative for arthralgias and myalgias.  Neurological: Positive for light-headedness. Negative for dizziness and headaches.     Physical Exam Triage Vital Signs ED Triage Vitals  Enc Vitals Group     BP 05/21/20 1107 (!) 154/98     Pulse Rate 05/21/20 1107 89     Resp 05/21/20 1107 18     Temp 05/21/20 1107 98.9 F (37.2 C)     Temp Source 05/21/20 1107 Oral     SpO2 05/21/20 1107 100 %     Weight --      Height --      Head Circumference --      Peak Flow --      Pain Score 05/21/20 1058 9     Pain Loc --      Pain Edu? --      Excl. in GC? --    No data found.  Updated Vital Signs BP (!) 154/98 (BP Location: Right Arm)   Pulse 89   Temp 98.9 F (37.2 C) (Oral)   Resp 18   SpO2 100%   Visual Acuity Right Eye Distance:   Left Eye Distance:   Bilateral Distance:    Right Eye Near:   Left Eye Near:    Bilateral Near:     Physical Exam Vitals reviewed.  Constitutional:      General: He is awake.     Appearance: Normal appearance. He is normal weight. He is not ill-appearing.     Comments: Very pleasant male appears stated age in no acute distress  HENT:     Head: Normocephalic and atraumatic.     Mouth/Throat:     Pharynx: No oropharyngeal exudate, posterior oropharyngeal erythema or uvula swelling.  Cardiovascular:     Rate and Rhythm: Normal rate and regular rhythm.     Heart sounds: No murmur heard.   Pulmonary:     Effort: Pulmonary effort is normal.     Breath sounds: Normal breath sounds. No stridor. No wheezing, rhonchi or rales.     Comments: Clear to auscultation bilaterally Abdominal:     General: Bowel sounds are normal.     Palpations: Abdomen is soft.     Tenderness: There is abdominal tenderness in the suprapubic area. There is no right CVA tenderness, left CVA tenderness, guarding or rebound.     Comments: Mild tenderness palpation in suprapubic region.  No CVA tenderness.  No evidence of acute abdomen on physical exam.   Neurological:     Mental Status: He is alert.  Psychiatric:        Behavior: Behavior is cooperative.      UC Treatments / Results  Labs (all labs ordered are listed, but only abnormal results are displayed) Labs Reviewed  POCT URINALYSIS DIPSTICK, ED / UC - Abnormal; Notable for the following components:      Result Value   Glucose, UA 250 (*)    Bilirubin Urine SMALL (*)    Ketones, ur TRACE (*)    Hgb urine dipstick SMALL (*)  Protein, ur 100 (*)    Nitrite POSITIVE (*)    Leukocytes,Ua LARGE (*)    All other components within normal limits  URINE CULTURE  CYTOLOGY, (ORAL, ANAL, URETHRAL) ANCILLARY ONLY    EKG   Radiology No results found.  Procedures Procedures (including critical care time)  Medications Ordered in UC Medications - No data to display  Initial Impression / Assessment and Plan / UC Course  I have reviewed the triage vital signs and the nursing notes.  Pertinent labs & imaging results that were available during my care of the patient were reviewed by me and considered in my medical decision making (see chart for details).      Unfortunately, patient took an Azo prior to giving [place] urine specimen so unable to read UA.  Given clinical presentation will empirically treat with Bactrim DS.  Urine culture sent.  STI swab collected-results pending.  Patient was instructed to drink plenty of fluid.  Strict return precautions given to which patient expressed understanding.  Final Clinical Impressions(s) / UC Diagnoses   Final diagnoses:  Dysuria  Cystitis     Discharge Instructions     Take Bactrim twice daily for 1 week.  This can upset your stomach so please take it with food. We will be in touch with your other results soon as we have them.  If you develop any oral lesions or rashes and you stop the medication and come see [place].  Please drink sure that you are drinking plenty of fluid. If you have any worsening symptoms including fever, blood in  your urine, nausea, vomiting, You need to go to the emergency room.    ED Prescriptions    Medication Sig Dispense Auth. Provider   sulfamethoxazole-trimethoprim (BACTRIM DS) 800-160 MG tablet Take 1 tablet by mouth 2 (two) times daily for 7 days. 14 tablet Pattye Meda, Noberto Retort, PA-C     PDMP not reviewed this encounter.   Jeani Hawking, PA-C 05/21/20 1132

## 2020-05-21 NOTE — Discharge Instructions (Addendum)
Take Bactrim twice daily for 1 week.  This can upset your stomach so please take it with food. We will be in touch with your other results soon as we have them.  If you develop any oral lesions or rashes and you stop the medication and come see [place].  Please drink sure that you are drinking plenty of fluid. If you have any worsening symptoms including fever, blood in your urine, nausea, vomiting, You need to go to the emergency room.

## 2020-05-23 LAB — URINE CULTURE: Culture: 30000 — AB

## 2020-05-23 LAB — CYTOLOGY, (ORAL, ANAL, URETHRAL) ANCILLARY ONLY
Chlamydia: NEGATIVE
Comment: NEGATIVE
Comment: NEGATIVE
Comment: NORMAL
Neisseria Gonorrhea: NEGATIVE
Trichomonas: NEGATIVE

## 2020-07-10 ENCOUNTER — Other Ambulatory Visit: Payer: Self-pay

## 2020-07-10 ENCOUNTER — Observation Stay (HOSPITAL_COMMUNITY)
Admission: EM | Admit: 2020-07-10 | Discharge: 2020-07-11 | Disposition: A | Discharge status: Home / Self Care | Payer: Medicaid Other | Attending: Internal Medicine | Admitting: Internal Medicine

## 2020-07-10 ENCOUNTER — Emergency Department (HOSPITAL_COMMUNITY): Payer: Medicaid Other

## 2020-07-10 DIAGNOSIS — T40604A Poisoning by unspecified narcotics, undetermined, initial encounter: Secondary | ICD-10-CM | POA: Diagnosis present

## 2020-07-10 DIAGNOSIS — G929 Unspecified toxic encephalopathy: Secondary | ICD-10-CM | POA: Insufficient documentation

## 2020-07-10 DIAGNOSIS — T402X1A Poisoning by other opioids, accidental (unintentional), initial encounter: Secondary | ICD-10-CM | POA: Diagnosis not present

## 2020-07-10 DIAGNOSIS — G9340 Encephalopathy, unspecified: Secondary | ICD-10-CM | POA: Diagnosis not present

## 2020-07-10 DIAGNOSIS — Z20822 Contact with and (suspected) exposure to covid-19: Secondary | ICD-10-CM | POA: Diagnosis not present

## 2020-07-10 DIAGNOSIS — J9601 Acute respiratory failure with hypoxia: Secondary | ICD-10-CM | POA: Diagnosis not present

## 2020-07-10 DIAGNOSIS — T40601A Poisoning by unspecified narcotics, accidental (unintentional), initial encounter: Secondary | ICD-10-CM | POA: Diagnosis present

## 2020-07-10 DIAGNOSIS — D72829 Elevated white blood cell count, unspecified: Secondary | ICD-10-CM | POA: Diagnosis not present

## 2020-07-10 DIAGNOSIS — I10 Essential (primary) hypertension: Secondary | ICD-10-CM | POA: Diagnosis not present

## 2020-07-10 DIAGNOSIS — Z0184 Encounter for antibody response examination: Secondary | ICD-10-CM | POA: Diagnosis not present

## 2020-07-10 DIAGNOSIS — T50901A Poisoning by unspecified drugs, medicaments and biological substances, accidental (unintentional), initial encounter: Secondary | ICD-10-CM

## 2020-07-10 DIAGNOSIS — J9602 Acute respiratory failure with hypercapnia: Secondary | ICD-10-CM | POA: Diagnosis not present

## 2020-07-10 LAB — COMPREHENSIVE METABOLIC PANEL
ALT: 285 U/L — ABNORMAL HIGH (ref 0–44)
AST: 303 U/L — ABNORMAL HIGH (ref 15–41)
Albumin: 3.9 g/dL (ref 3.5–5.0)
Alkaline Phosphatase: 67 U/L (ref 38–126)
Anion gap: 9 (ref 5–15)
BUN: 11 mg/dL (ref 6–20)
CO2: 29 mmol/L (ref 22–32)
Calcium: 8.6 mg/dL — ABNORMAL LOW (ref 8.9–10.3)
Chloride: 100 mmol/L (ref 98–111)
Creatinine, Ser: 1.05 mg/dL (ref 0.61–1.24)
GFR, Estimated: >60 mL/min (ref >60)
Glucose, Bld: 65 mg/dL — ABNORMAL LOW (ref 70–99)
Potassium: 3.6 mmol/L (ref 3.5–5.1)
Sodium: 138 mmol/L (ref 135–145)
Total Bilirubin: 0.9 mg/dL (ref 0.3–1.2)
Total Protein: 6.7 g/dL (ref 6.5–8.1)

## 2020-07-10 LAB — CBC WITH DIFFERENTIAL/PLATELET
Abs Immature Granulocytes: 0.27 10*3/uL — ABNORMAL HIGH (ref 0.00–0.07)
Basophils Absolute: 0.1 10*3/uL (ref 0.0–0.1)
Basophils Relative: 0 %
Eosinophils Absolute: 0 10*3/uL (ref 0.0–0.5)
Eosinophils Relative: 0 %
HCT: 47.2 % (ref 39.0–52.0)
Hemoglobin: 15.6 g/dL (ref 13.0–17.0)
Immature Granulocytes: 1 %
Lymphocytes Relative: 4 %
Lymphs Abs: 1.1 10*3/uL (ref 0.7–4.0)
MCH: 29.4 pg (ref 26.0–34.0)
MCHC: 33.1 g/dL (ref 30.0–36.0)
MCV: 89.1 fL (ref 80.0–100.0)
Monocytes Absolute: 1.9 10*3/uL — ABNORMAL HIGH (ref 0.1–1.0)
Monocytes Relative: 6 %
Neutro Abs: 25.8 10*3/uL — ABNORMAL HIGH (ref 1.7–7.7)
Neutrophils Relative %: 89 %
Platelets: 254 10*3/uL (ref 150–400)
RBC: 5.3 MIL/uL (ref 4.22–5.81)
RDW: 13 % (ref 11.5–15.5)
WBC: 29.1 10*3/uL — ABNORMAL HIGH (ref 4.0–10.5)
nRBC: 0 % (ref 0.0–0.2)

## 2020-07-10 LAB — ACETAMINOPHEN LEVEL: Acetaminophen (Tylenol), Serum: <10 ug/mL — ABNORMAL LOW (ref 10–30)

## 2020-07-10 LAB — LACTIC ACID, PLASMA: Lactic Acid, Venous: 0.9 mmol/L (ref 0.5–1.9)

## 2020-07-10 LAB — RESP PANEL BY RT-PCR (FLU A&B, COVID) ARPGX2
Influenza A by PCR: NEGATIVE
Influenza B by PCR: NEGATIVE
SARS Coronavirus 2 by RT PCR: NEGATIVE

## 2020-07-10 LAB — LIPASE, BLOOD: Lipase: 151 U/L — ABNORMAL HIGH (ref 11–51)

## 2020-07-10 LAB — SALICYLATE LEVEL: Salicylate Lvl: <7 mg/dL — ABNORMAL LOW (ref 7.0–30.0)

## 2020-07-10 LAB — CBG MONITORING, ED: Glucose-Capillary: 98 mg/dL (ref 70–99)

## 2020-07-10 LAB — PHOSPHORUS: Phosphorus: 3.4 mg/dL (ref 2.5–4.6)

## 2020-07-10 LAB — TSH: TSH: 1.065 u[IU]/mL (ref 0.350–4.500)

## 2020-07-10 LAB — CK: Total CK: 99 U/L (ref 49–397)

## 2020-07-10 LAB — MAGNESIUM: Magnesium: 1.9 mg/dL (ref 1.7–2.4)

## 2020-07-10 LAB — T4, FREE: Free T4: 0.97 ng/dL (ref 0.61–1.12)

## 2020-07-10 LAB — ETHANOL: Alcohol, Ethyl (B): <10 mg/dL (ref <10)

## 2020-07-10 MED ORDER — NALOXONE HCL 4 MG/10ML IJ SOLN
2.0000 mg/h | INTRAVENOUS | Status: DC
  Administered 2020-07-10: 2 mg/h via INTRAVENOUS
  Administered 2020-07-10: 2.5 mg/h via INTRAVENOUS
  Administered 2020-07-11: 2 mg/h via INTRAVENOUS
  Filled 2020-07-10 (×5): qty 10

## 2020-07-10 MED ORDER — LACTATED RINGERS IV SOLN
INTRAVENOUS | Status: DC
  Administered 2020-07-10: 1000 mL via INTRAVENOUS

## 2020-07-10 MED ORDER — NALOXONE HCL 2 MG/2ML IJ SOSY
PREFILLED_SYRINGE | INTRAMUSCULAR | Status: AC
  Filled 2020-07-10: qty 4

## 2020-07-10 MED ORDER — DOCUSATE SODIUM 100 MG PO CAPS: 100.0000 mg | ORAL_CAPSULE | Freq: Two times a day (BID) | ORAL | Status: DC | PRN

## 2020-07-10 MED ORDER — POLYETHYLENE GLYCOL 3350 17 G PO PACK: 17.0000 g | PACK | Freq: Every day | ORAL | Status: DC | PRN

## 2020-07-10 MED ORDER — NALOXONE HCL 0.4 MG/ML IJ SOLN
0.4000 mg | INTRAMUSCULAR | Status: DC | PRN
  Administered 2020-07-11: 0.4 mg via INTRAVENOUS
  Filled 2020-07-10: qty 1

## 2020-07-10 MED ORDER — NALOXONE HCL 2 MG/2ML IJ SOSY
PREFILLED_SYRINGE | INTRAMUSCULAR | Status: AC
  Administered 2020-07-10: 2 mg via INTRAVENOUS
  Filled 2020-07-10: qty 2

## 2020-07-10 MED ORDER — CHLORHEXIDINE GLUCONATE CLOTH 2 % EX PADS: 6.0000 | MEDICATED_PAD | Freq: Every day | CUTANEOUS | Status: DC

## 2020-07-10 MED ORDER — NALOXONE HCL 0.4 MG/ML IJ SOLN
0.2000 mg | INTRAMUSCULAR | Status: DC | PRN
Start: 2020-07-10 — End: 2020-07-10

## 2020-07-10 MED ORDER — ENOXAPARIN SODIUM 40 MG/0.4ML IJ SOSY
40.0000 mg | PREFILLED_SYRINGE | INTRAMUSCULAR | Status: DC
  Administered 2020-07-10: 40 mg via SUBCUTANEOUS
  Filled 2020-07-10: qty 0.4

## 2020-07-10 MED ORDER — NALOXONE HCL 0.4 MG/ML IJ SOLN
INTRAMUSCULAR | Status: AC
  Administered 2020-07-11: 0.4 mg via INTRAVENOUS
  Filled 2020-07-10: qty 1

## 2020-07-10 NOTE — ED Triage Notes (Signed)
Pt arrived via [facility] with c/c of OD. Per EMS pt was unresponsive, not breathing. Pt started baging and gave 1mg  narcan. Pt unsure what he took.  110HR, 112/52, 98%RA

## 2020-07-10 NOTE — H&P (Addendum)
NAME:  Miguel [name], MRN:  [id number], DOB:  [birth year], LOS: 0 ADMISSION DATE:  07/10/2020,  CHIEF COMPLAINT: Opioid intoxication  History of Present Illness:  This is a 30 year old man with a history of polysubstance abuse including cocaine, opiates, benzodiazepines who is presenting with altered mental status.  Symptoms started today after he snorted an unknown substance from his cousin.Miguel [name]  He was at his home with a cousin when the event occurred.  He asked his cousin for some Xanax to relax his nerves but the cousin only had some sort of powder in which he snorted up his nose.  He does not know what was in the powder he thinks it was ice laced with other drugs.  After he snorted the drug he became unresponsive and was brought in to the emergency department via EMS.  During his transport emergency services administered 1 mg of Narcan with improvement in his mental status.  In the emergency department he became somnolent again and was given Narcan 4 mg with good response.  He was in his normal state of health prior to snorting the unknown substance.  No chest pain, fever, rash.  No vomiting.  No abdominal pain.  He has 4 kids and works a lot and is under a lot of pressure.  He is not suicidal.   Pertinent  Medical History  Polysubstance abuse: Cocaine, benzodiazepines, opiates Schizophrenia, bipolar disorder   Objective   Blood pressure (!) 118/96, pulse (!) 105, temperature 98.6 F (37 C), temperature source Oral, resp. rate 18, height 5' 6" (1.676 m), weight 117.9 kg, SpO2 100 %.       No intake or output data in the 24 hours ending 07/10/20 2230 Filed Weights   07/10/20 1400  Weight: 117.9 kg    Examination: Constitutional:      Comments: Somnolent but arousable HEENT:    No icterus.  No oral lesions.  Pinpoint pupils.  Cardiovascular:    Rate and Rhythm: Normal rate and regular rhythm. Pulmonary:    Comments: Bradypnea.  Abdominal:    Nondistended  Musculoskeletal:         General: No swelling or signs of injury. Skin:    Bilateral lower extremity stasis changes Neurological: Somnolent but arousable.  Answers questions then falls back asleep.  Oriented to person place time and event.    Review of Systems:   No chest pain, no fever No abdominal pain. Limited due to condition  Past Medical History:  He,  has a past medical history of Bipolar 1 disorder ([name]), Depression, Hand fracture, left (2010), Hand fracture, right (2008), and Schizophrenia ([name]).   Surgical History:   Past Surgical History:  Procedure Laterality Date   CYST EXCISION     left hand fracture Left 2010   Dr Ninfa Linden     Social History:   reports that he has never smoked. He has never used smokeless tobacco. He reports that he does not drink alcohol and does not use drugs.   Family History:  His family history includes Bipolar disorder in his mother; Schizophrenia in his mother and sister.   Allergies Allergies  Allergen Reactions   Tramadol Hives   Vicodin [Hydrocodone-Acetaminophen] Nausea Only      Assessment & Plan:  Opioid intoxication 2.   Acute encephalopathy  3.   Acute hypercapnic/hypoxemic respiratory failure 3.   Leukocytosis 4.   Elevated liver function tests   53 old male with history of polysubstance abuse including cocaine, opiates, benzodiazepine presenting  with toxidrome of somnolence, bradypnea with a respiratory rate of less than 10, and pinpoint pupils after he snorted a powder substance. He thinks the powder was ice that was laced with other drugs.   In the emergency department he had improvement in his mental status and respiratory rate with 4 mg IV Narcan.  Ordering a Narcan infusion at two thirds of 4 mg which is about 2.5 mg an hour.  We will continue infusion throughout the night and try to wean in the morning.  No head trauma or C-spine trauma.   He is also hypoxemic probably from alveolar hypoventilation and atelectasis.  He may also could  have aspirated during the event.  Chest x-ray shows no opacities.  Will repeat x-ray after IV crystalloids. if hypoxemia does not resolve consider CT chest.  Continue supplemental oxygen, goal SPO2 88 to 92%  He has leukocytosis at 29.  May be a stress response.  He has venous stasis of his lower extremities but no cellulitis.  No fever.  Hemodynamics are acceptable.  Less likely sepsis at this time.  If hemodynamics change overnight or if develops fever low threshold for starting empiric antibiotics.  Repeat CBC in a.m. LFTs are elevated.  Checking HIV and hepatitis.  Also check CK level, may be rhabdomyolysis developing after drug ingestion.  Repeat CMP in a.m.  Alk phos normal.  Repeat LFTs in a.m.  Consider right upper quadrant ultrasound if does not resolve.  Clear liquid diet, advance as tolerated.   Lovenox 40 mg daily for VTE prophylaxis Peripheral IV access  Best practice (right click and "Reselect all SmartList Selections" daily)  Diet:  Oral Pain/Anxiety/Delirium protocol (if indicated): No VAP protocol (if indicated): Not indicated DVT prophylaxis: LMWH GI prophylaxis: N/A Central venous access:  N/A Arterial line:  N/A Foley:  N/A Mobility:  bed rest  PT consulted: N/A Code Status:  full code Disposition: ICU when OD resolves   Critical care time: 45 minutes         

## 2020-07-10 NOTE — Progress Notes (Signed)
eLink Physician-Brief Progress Note Patient Name: [name] DOB: [birth year] MRN: [id number]   Date of Service  07/10/2020  HPI/Events of Note  55M admitted after opioid overdose requiring Narcan in the field. Recrudesence of narcosis in the ED so patient was started on a Narcan drip and admitted to the ICU for monitoring.  On my evaluation, patient is not currently on the Narcan drip (it just ran out per RN). He is asleep and on 3L [place].  Spoke to RN who went into room and woke the patient fairly easily.   eICU Interventions  # Neuro: - RN to restart Narcan drip as soon as it arrives from pharmacy. - Nasal cannula NOT to be applied without MD order -- he was not requiring oxygen on arrival to ICU, and pulse oximetry can be used to help monitor for respiratory depression if the patient is on room air (but not when on oxygen).  DVT PPX: Lovenox [facility] PPX: Not indicated      Intervention Category Evaluation Type: New Patient Evaluation  Janae Bridgeman 07/10/2020, 10:49 PM

## 2020-07-10 NOTE — Progress Notes (Signed)
eLink Physician-Brief Progress Note Patient Name: Miguel [name] DOB: Aug 24, 1990 MRN: [id number]   Date of Service  07/10/2020  HPI/Events of Note  RN reports patient is more somnolent. He is able to be woken up and is trying to urinate but appears to be having retention and keeps falling asleep while attempting to urinate.     eICU Interventions  RN to perform bladder scan and straight cath if needed.  Narcan drip rate of 2mg /hr may still be appropriate -- it has only been running for 20 mins since it was received from pharmacy after running out.  Ordered Narcan 0.4mg  IV Q43mins PRN to acutely raise levels and improve alertness, then will continue with Narcan infusion at same rate.      Intervention Category Major Interventions: Change in mental status - evaluation and management  12m 07/10/2020, 11:45 PM

## 2020-07-10 NOTE — ED Provider Notes (Signed)
[facility] EMERGENCY DEPARTMENT Provider Note   CSN: [id number] Arrival date & time: 07/10/20  1428     History Chief Complaint  Patient presents with   Drug Overdose    Pt arrived via [facility] with c/c of OD. Per EMS pt was unresponsive, not breathing. Pt started baging and gave 1mg  narcan. Pt unsure what he took.  110HR, 112/52, 98%RA     Miguel [name] is a 30 y.o. male.  HPI Patient arrived by EMS for drug overdose.  He was given a milligram of Narcan in the field with improved mental status.  Patient's significant other, introduced as his baby mama, was with him at the time.  She reports he had gone into the bathroom and came out and laid his head down on her lap.  She did not know that he had taken any drugs.  She reports she was looking at her phone and then noticed that he seemed very deeply asleep and he was very pale and his hands were turning blue.  He was difficult to arouse.  She was able to contact a friend to bring some Narcan intranasal.  She administered it was no real change.  He was making some gurgling respirations that were slow.  At that time EMS was called.  She is not sure what he took.  She herself is a recovering heroin addict and recognized this change as likely narcotic overdose.  Per the patient, he took something that his cousin gave him telling him it was going to make them be really good.  Apparently the patient thought it was crystal meth.  The patient reports he does not know what he took.  At this time he is arousable and oriented to person place and time.  He is somnolent but can answer questions appropriately.  Patient significant other denies that he vomited or choked on vomitus although there was a gurgling sound to his respirations.    Past Medical History:  Diagnosis Date   Bipolar 1 disorder (HCC)    Depression    Hand fracture, left 2010   Hand fracture, right 2008   no surgery required   Schizophrenia [facility])     Patient  Active Problem List   Diagnosis Date Noted   Polysubstance dependence including opioid type drug with complication, continuous use (HCC) 03/14/2015   Major depressive disorder, single episode, severe without psychotic features (HCC) 03/13/2015   Cocaine use disorder, moderate, 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   Tobacco use disorder 09/22/2014   Sedative, hypnotic or anxiolytic use disorder, severe, 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   HTN (hypertension) 09/21/2014   Suicide attempt (HCC) 09/19/2014   Nightmares    PTSD (post-traumatic stress disorder) 06/11/2014   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/11/2014    Past Surgical History:  Procedure Laterality Date   CYST EXCISION     left hand fracture Left 2010   Dr 2011       Family History  Problem Relation Age of Onset   Bipolar disorder Mother    Schizophrenia Mother    Schizophrenia Sister     Social History   Tobacco Use   Smoking status: Never   Smokeless tobacco: Never  Substance Use Topics   Alcohol use: No    Comment: He drank this time in effort to hurt self/ usually non drinker   Drug use: No    Home Medications Prior to Admission medications   Not on File    Allergies  Tramadol and Vicodin [hydrocodone-acetaminophen]  Review of Systems   Review of Systems Level 5 caveat cannot obtain review of systems at this time. Physical Exam Updated Vital Signs BP (!) 104/54   Pulse (!) 104   Temp 98.6 F (37 C) (Oral)   Resp 13   Ht 5\' 6"  (1.676 m)   Wt 117.9 kg   SpO2 94%   BMI 41.97 kg/m   Physical Exam Constitutional:      Comments: Patient is sleeping.  Vital signs are stable on the monitor.  Patient is not hypoxic.  Color is good.  No respiratory distress.  He rouses to moderate stimulus.  HENT:     Head: Normocephalic and atraumatic.     Mouth/Throat:     Pharynx: Oropharynx is clear.  Eyes:     Extraocular Movements: Extraocular movements intact.     Pupils:  Pupils are equal, round, and reactive to light.  Cardiovascular:     Rate and Rhythm: Normal rate and regular rhythm.  Pulmonary:     Comments: No respiratory distress.  Occasional cough and rhonchi that clear. Abdominal:     General: There is no distension.     Palpations: Abdomen is soft.     Tenderness: There is no abdominal tenderness. There is no guarding.  Musculoskeletal:        General: No swelling or signs of injury.  Skin:    General: Skin is warm and dry.  Neurological:     Comments: Patient is somnolent and speech is somewhat slurred but when he is fully awake and he is oriented to person place and time.  Speech content is normal.    ED Results / Procedures / Treatments   Labs (all labs ordered are listed, but only abnormal results are displayed) Labs Reviewed - No data to display  EKG None  Radiology No results found.  Procedures Procedures  CRITICAL CARE Performed by:   Total critical care time: 60 minutes  Critical care time was exclusive of separately billable procedures and treating other patients.  Critical care was necessary to treat or prevent imminent or life-threatening deterioration.  Critical care was time spent personally by me on the following activities: development of treatment plan with patient and/or surrogate as well as nursing, discussions with consultants, evaluation of patient's response to treatment, examination of patient, obtaining history from patient or surrogate, ordering and performing treatments and interventions, ordering and review of laboratory studies, ordering and review of radiographic studies, pulse oximetry and re-evaluation of patient's condition.  Medications Ordered in ED Medications - No data to display  ED Course  I have reviewed the triage vital signs and the nursing notes.  Pertinent labs & imaging results that were available during my care of the patient were reviewed by me and considered in my  medical decision making (see chart for details).    MDM Rules/Calculators/A&P                         Patient is respiratory status is stable at this time.At this time, patient does not show evidence of airway compromise.  We will proceed with basic lab work for overdose as well as chest x-ray for possible aspiration.  Continue to monitor for any need of repeat Narcan.  Had a rebound of apnea and somnolence requiring valve mask administration performed by PA-C Geiple.  2 more milligrams of Narcan administered.  Patient regained consciousness and returned to alert and  spontaneous respirations.  At this point, with quite rapid and significant rebound of narcotic overdose with apnea, patient will be started on Narcan drip and admitted to the intensivist team for continued treatment.  Final Clinical Impression(s) / ED Diagnoses Final diagnoses:  Accidental drug overdose, initial encounter  Opiate overdose, accidental or unintentional, initial encounter [facility])    Rx / DC Orders ED Discharge Orders     None        Arby Barrette, MD 07/10/20 [phone]

## 2020-07-11 ENCOUNTER — Other Ambulatory Visit: Payer: Self-pay

## 2020-07-11 ENCOUNTER — Emergency Department (HOSPITAL_BASED_OUTPATIENT_CLINIC_OR_DEPARTMENT_OTHER): Payer: Medicaid Other

## 2020-07-11 ENCOUNTER — Inpatient Hospital Stay (HOSPITAL_BASED_OUTPATIENT_CLINIC_OR_DEPARTMENT_OTHER)
Admission: EM | Admit: 2020-07-11 | Discharge: 2020-07-14 | DRG: 917 | Discharge status: Against Medical Advice | Payer: Medicaid Other | Attending: Internal Medicine | Admitting: Internal Medicine

## 2020-07-11 ENCOUNTER — Encounter (HOSPITAL_COMMUNITY): Payer: Self-pay | Admitting: Internal Medicine

## 2020-07-11 ENCOUNTER — Encounter: Payer: Self-pay | Admitting: Emergency Medicine

## 2020-07-11 ENCOUNTER — Encounter (HOSPITAL_BASED_OUTPATIENT_CLINIC_OR_DEPARTMENT_OTHER): Payer: Self-pay | Admitting: Emergency Medicine

## 2020-07-11 DIAGNOSIS — Z9151 Personal history of suicidal behavior: Secondary | ICD-10-CM

## 2020-07-11 DIAGNOSIS — E876 Hypokalemia: Secondary | ICD-10-CM | POA: Diagnosis present

## 2020-07-11 DIAGNOSIS — F1910 Other psychoactive substance abuse, uncomplicated: Secondary | ICD-10-CM

## 2020-07-11 DIAGNOSIS — G9340 Encephalopathy, unspecified: Secondary | ICD-10-CM | POA: Diagnosis not present

## 2020-07-11 DIAGNOSIS — I10 Essential (primary) hypertension: Secondary | ICD-10-CM | POA: Diagnosis present

## 2020-07-11 DIAGNOSIS — J690 Pneumonitis due to inhalation of food and vomit: Secondary | ICD-10-CM | POA: Diagnosis present

## 2020-07-11 DIAGNOSIS — F4310 Post-traumatic stress disorder, unspecified: Secondary | ICD-10-CM | POA: Diagnosis present

## 2020-07-11 DIAGNOSIS — F32A Depression, unspecified: Secondary | ICD-10-CM | POA: Insufficient documentation

## 2020-07-11 DIAGNOSIS — G928 Other toxic encephalopathy: Secondary | ICD-10-CM | POA: Diagnosis present

## 2020-07-11 DIAGNOSIS — J9602 Acute respiratory failure with hypercapnia: Secondary | ICD-10-CM | POA: Diagnosis not present

## 2020-07-11 DIAGNOSIS — J9601 Acute respiratory failure with hypoxia: Secondary | ICD-10-CM | POA: Diagnosis not present

## 2020-07-11 DIAGNOSIS — F319 Bipolar disorder, unspecified: Secondary | ICD-10-CM | POA: Insufficient documentation

## 2020-07-11 DIAGNOSIS — F209 Schizophrenia, unspecified: Secondary | ICD-10-CM | POA: Insufficient documentation

## 2020-07-11 DIAGNOSIS — Z818 Family history of other mental and behavioral disorders: Secondary | ICD-10-CM

## 2020-07-11 DIAGNOSIS — R4182 Altered mental status, unspecified: Secondary | ICD-10-CM | POA: Diagnosis present

## 2020-07-11 DIAGNOSIS — Z6841 Body Mass Index (BMI) 40.0 and over, adult: Secondary | ICD-10-CM

## 2020-07-11 DIAGNOSIS — Z885 Allergy status to narcotic agent status: Secondary | ICD-10-CM

## 2020-07-11 DIAGNOSIS — Z5900 Homelessness unspecified: Secondary | ICD-10-CM

## 2020-07-11 DIAGNOSIS — Z20822 Contact with and (suspected) exposure to covid-19: Secondary | ICD-10-CM | POA: Diagnosis present

## 2020-07-11 DIAGNOSIS — T50901A Poisoning by unspecified drugs, medicaments and biological substances, accidental (unintentional), initial encounter: Principal | ICD-10-CM | POA: Diagnosis present

## 2020-07-11 HISTORY — DX: Other psychoactive substance use, unspecified, uncomplicated: F19.90

## 2020-07-11 LAB — MRSA NEXT GEN BY PCR, NASAL: MRSA by PCR Next Gen: DETECTED — AB

## 2020-07-11 LAB — URINALYSIS, ROUTINE W REFLEX MICROSCOPIC
Bilirubin Urine: NEGATIVE
Glucose, UA: NEGATIVE mg/dL
Hgb urine dipstick: NEGATIVE
Ketones, ur: NEGATIVE mg/dL
Nitrite: NEGATIVE
Protein, ur: NEGATIVE mg/dL
Specific Gravity, Urine: 1.006 (ref 1.005–1.030)
pH: 6 (ref 5.0–8.0)

## 2020-07-11 LAB — COMPREHENSIVE METABOLIC PANEL
ALT: 197 U/L — ABNORMAL HIGH (ref 0–44)
ALT: 195 U/L — ABNORMAL HIGH (ref 0–44)
AST: 128 U/L — ABNORMAL HIGH (ref 15–41)
AST: 103 U/L — ABNORMAL HIGH (ref 15–41)
Albumin: 3.5 g/dL (ref 3.5–5.0)
Albumin: 4.3 g/dL (ref 3.5–5.0)
Alkaline Phosphatase: 55 U/L (ref 38–126)
Alkaline Phosphatase: 71 U/L (ref 38–126)
Anion gap: 6 (ref 5–15)
Anion gap: 7 (ref 5–15)
BUN: 5 mg/dL — ABNORMAL LOW (ref 6–20)
BUN: 10 mg/dL (ref 6–20)
CO2: 30 mmol/L (ref 22–32)
CO2: 33 mmol/L — ABNORMAL HIGH (ref 22–32)
Calcium: 8.8 mg/dL — ABNORMAL LOW (ref 8.9–10.3)
Calcium: 8.9 mg/dL (ref 8.9–10.3)
Chloride: 100 mmol/L (ref 98–111)
Chloride: 96 mmol/L — ABNORMAL LOW (ref 98–111)
Creatinine, Ser: 0.69 mg/dL (ref 0.61–1.24)
Creatinine, Ser: 1.25 mg/dL — ABNORMAL HIGH (ref 0.61–1.24)
GFR, Estimated: >60 mL/min (ref >60)
GFR, Estimated: >60 mL/min (ref >60)
Glucose, Bld: 83 mg/dL (ref 70–99)
Glucose, Bld: 146 mg/dL — ABNORMAL HIGH (ref 70–99)
Potassium: 3.5 mmol/L (ref 3.5–5.1)
Potassium: 4.2 mmol/L (ref 3.5–5.1)
Sodium: 136 mmol/L (ref 135–145)
Sodium: 136 mmol/L (ref 135–145)
Total Bilirubin: 1.2 mg/dL (ref 0.3–1.2)
Total Bilirubin: 0.6 mg/dL (ref 0.3–1.2)
Total Protein: 6.1 g/dL — ABNORMAL LOW (ref 6.5–8.1)
Total Protein: 7.4 g/dL (ref 6.5–8.1)

## 2020-07-11 LAB — CBC
HCT: 43.4 % (ref 39.0–52.0)
Hemoglobin: 14.1 g/dL (ref 13.0–17.0)
MCH: 29 pg (ref 26.0–34.0)
MCHC: 32.5 g/dL (ref 30.0–36.0)
MCV: 89.1 fL (ref 80.0–100.0)
Platelets: 197 10*3/uL (ref 150–400)
RBC: 4.87 MIL/uL (ref 4.22–5.81)
RDW: 13.2 % (ref 11.5–15.5)
WBC: 10.6 10*3/uL — ABNORMAL HIGH (ref 4.0–10.5)
nRBC: 0 % (ref 0.0–0.2)

## 2020-07-11 LAB — CBC WITH DIFFERENTIAL/PLATELET
Abs Immature Granulocytes: 0.04 10*3/uL (ref 0.00–0.07)
Basophils Absolute: 0 10*3/uL (ref 0.0–0.1)
Basophils Relative: 0 %
Eosinophils Absolute: 0.1 10*3/uL (ref 0.0–0.5)
Eosinophils Relative: 0 %
HCT: 46.6 % (ref 39.0–52.0)
Hemoglobin: 15.4 g/dL (ref 13.0–17.0)
Immature Granulocytes: 0 %
Lymphocytes Relative: 6 %
Lymphs Abs: 0.7 10*3/uL (ref 0.7–4.0)
MCH: 29.3 pg (ref 26.0–34.0)
MCHC: 33 g/dL (ref 30.0–36.0)
MCV: 88.8 fL (ref 80.0–100.0)
Monocytes Absolute: 0.5 10*3/uL (ref 0.1–1.0)
Monocytes Relative: 4 %
Neutro Abs: 10.4 10*3/uL — ABNORMAL HIGH (ref 1.7–7.7)
Neutrophils Relative %: 90 %
Platelets: 219 10*3/uL (ref 150–400)
RBC: 5.25 MIL/uL (ref 4.22–5.81)
RDW: 12.8 % (ref 11.5–15.5)
WBC: 11.8 10*3/uL — ABNORMAL HIGH (ref 4.0–10.5)
nRBC: 0 % (ref 0.0–0.2)

## 2020-07-11 LAB — ETHANOL: Alcohol, Ethyl (B): <10 mg/dL (ref <10)

## 2020-07-11 LAB — GLUCOSE, CAPILLARY
Glucose-Capillary: 107 mg/dL — ABNORMAL HIGH (ref 70–99)
Glucose-Capillary: 80 mg/dL (ref 70–99)
Glucose-Capillary: 94 mg/dL (ref 70–99)

## 2020-07-11 LAB — HEPATITIS C ANTIBODY: HCV Ab: NONREACTIVE

## 2020-07-11 LAB — ACETAMINOPHEN LEVEL: Acetaminophen (Tylenol), Serum: <10 ug/mL — ABNORMAL LOW (ref 10–30)

## 2020-07-11 LAB — SALICYLATE LEVEL: Salicylate Lvl: <7 mg/dL — ABNORMAL LOW (ref 7.0–30.0)

## 2020-07-11 LAB — HIV ANTIBODY (ROUTINE TESTING W REFLEX): HIV Screen 4th Generation wRfx: NONREACTIVE

## 2020-07-11 LAB — HEPATITIS B CORE ANTIBODY, TOTAL: Hep B Core Total Ab: NONREACTIVE

## 2020-07-11 LAB — LACTIC ACID, PLASMA: Lactic Acid, Venous: 2.1 mmol/L (ref 0.5–1.9)

## 2020-07-11 LAB — HEPATITIS B SURFACE ANTIGEN: Hepatitis B Surface Ag: NONREACTIVE

## 2020-07-11 LAB — CBG MONITORING, ED: Glucose-Capillary: 151 mg/dL — ABNORMAL HIGH (ref 70–99)

## 2020-07-11 MED ORDER — MUPIROCIN 2 % EX OINT: 1.0000 "application " | TOPICAL_OINTMENT | Freq: Two times a day (BID) | CUTANEOUS | Status: DC

## 2020-07-11 MED ORDER — NALOXONE HCL 4 MG/10ML IJ SOLN
2.0000 mg/h | INTRAVENOUS | Status: DC
  Administered 2020-07-11 (×3): 2 mg/h via INTRAVENOUS
  Filled 2020-07-11 (×2): qty 20

## 2020-07-11 MED ORDER — SODIUM CHLORIDE 0.9 % IV BOLUS
1000.0000 mL | Freq: Once | INTRAVENOUS | Status: AC
  Administered 2020-07-11: 1000 mL via INTRAVENOUS

## 2020-07-11 MED ORDER — NALOXONE HCL 4 MG/10ML IJ SOLN
2.0000 mg/h | INTRAVENOUS | Status: DC
  Filled 2020-07-11: qty 20

## 2020-07-11 NOTE — ED Provider Notes (Signed)
[facility] EMERGENCY DEPARTMENT Provider Note   CSN: [id number] Arrival date & time: 07/11/20  2146     History Chief Complaint  Patient presents with   Altered Mental Status    Miguel [name] is a 30 y.o. male.  The history is provided by the patient and medical records.  Altered Mental Status Miguel [name] is a 30 y.o. male who presents to the Emergency Department complaining of altered mental status. Level V caveat due to altered mental status. Patient presented to the emergency department from friend by private vehicle. Friend told triage nurse that the patient was not responsive and foaming at the mouth and he was given 2 mg Narcan intranasal. On arrival to the emergency department patient is able to state his name and date of birth. Patient does not provide additional history.    Past Medical History:  Diagnosis Date   Bipolar 1 disorder (HCC)    Depression    Hand fracture, left 01/30/2008   Hand fracture, right 01/29/2006   no surgery required   IV drug user    Schizophrenia [facility])     Patient Active Problem List   Diagnosis Date Noted   Schizophrenia (HCC) 07/11/2020   Depression 07/11/2020   Bipolar 1 disorder (HCC) 07/11/2020   Opiate overdose (HCC) 07/10/2020   Polysubstance dependence including opioid type drug with complication, continuous use (HCC) 03/14/2015   Major depressive disorder, single episode, severe without psychotic features (HCC) 03/13/2015   Cocaine use disorder, moderate, 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   Tobacco use disorder 09/22/2014   Sedative, hypnotic or anxiolytic use disorder, severe, 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   HTN (hypertension) 09/21/2014   Suicide attempt (HCC) 09/19/2014   Nightmares    PTSD (post-traumatic stress disorder) 06/11/2014   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/11/2014   Hand fracture, left 01/30/2008   Hand fracture, right 01/29/2006    Past Surgical History:   Procedure Laterality Date   CYST EXCISION     left hand fracture Left 2010   Dr Magnus Ivan       Family History  Problem Relation Age of Onset   Bipolar disorder Mother    Schizophrenia Mother    Schizophrenia Sister     Social History   Tobacco Use   Smoking status: Never   Smokeless tobacco: Never  Substance Use Topics   Alcohol use: No    Comment: He drank this time in effort to hurt self/ usually non drinker   Drug use: No    Home Medications Prior to Admission medications   Not on File    Allergies    Tramadol and Vicodin [hydrocodone-acetaminophen]  Review of Systems   Review of Systems  Unable to perform ROS: Mental status change   Physical Exam Updated Vital Signs BP 104/61   Pulse (!) 119   Temp (!) 101.3 F (38.5 C) (Oral)   Resp 15   Ht 5\' 6"  (1.676 m)   Wt 115.4 kg   SpO2 100%   BMI 41.06 kg/m   Physical Exam Vitals and nursing note reviewed.  Constitutional:      Appearance: He is well-developed. He is ill-appearing.     Comments: Drowsy. Awakens to verbal stimuli.  HENT:     Head: Normocephalic and atraumatic.  Eyes:     Comments: Pupils pinpoint and reactive  Cardiovascular:     Rate and Rhythm: Regular rhythm. Tachycardia present.     Heart sounds: No murmur heard. Pulmonary:  Effort: Pulmonary effort is normal. No respiratory distress.     Breath sounds: Normal breath sounds.  Abdominal:     Palpations: Abdomen is soft.     Tenderness: There is no abdominal tenderness. There is no guarding or rebound.  Musculoskeletal:        General: No tenderness.  Skin:    General: Skin is warm and dry.  Neurological:     Comments: Awakens to verbal stimuli. Mumbling speech. Moves all extremities symmetrically.  Psychiatric:     Comments: Unable to assess    ED Results / Procedures / Treatments   Labs (all labs ordered are listed, but only abnormal results are displayed) Labs Reviewed  COMPREHENSIVE METABOLIC PANEL - Abnormal;  Notable for the following components:      Result Value   Chloride 96 (*)    CO2 33 (*)    Glucose, Bld 146 (*)    Creatinine, Ser 1.25 (*)    AST 103 (*)    ALT 195 (*)    All other components within normal limits  CBC WITH DIFFERENTIAL/PLATELET - Abnormal; Notable for the following components:   WBC 11.8 (*)    Neutro Abs 10.4 (*)    All other components within normal limits  LACTIC ACID, PLASMA - Abnormal; Notable for the following components:   Lactic Acid, Venous 2.1 (*)    All other components within normal limits  CBG MONITORING, ED - Abnormal; Notable for the following components:   Glucose-Capillary 151 (*)    All other components within normal limits  CULTURE, BLOOD (SINGLE)  ETHANOL  LACTIC ACID, PLASMA  ACETAMINOPHEN LEVEL  SALICYLATE LEVEL    EKG None  Radiology DG Chest Port 1 View  Result Date: 07/11/2020 CLINICAL DATA:  Overdose, altered mental status, fever EXAM: PORTABLE CHEST 1 VIEW COMPARISON:  07/10/2020 FINDINGS: Low lung volumes. Ill-defined hazy opacities in the mid to lower lungs favor atelectatic change though airspace disease or sequela of aspiration is not fully excluded in the setting of fever and altered mental status. No pneumothorax. No effusion. Stable cardiomediastinal contours with likely cardiomegaly. No acute osseous or soft tissue abnormality. Telemetry leads overlie the chest. IMPRESSION: Low lung volumes with hazy basilar opacities favoring atelectasis though early airspace disease or sequela of aspiration not fully excluded in the setting of fever and altered mental status. Electronically Signed   By: Kreg Shropshire M.D.   On: 07/11/2020 22:15   DG Chest Port 1 View  Result Date: 07/10/2020 CLINICAL DATA:  Drug overdose.  Risk of aspiration.  Unresponsive. EXAM: PORTABLE CHEST 1 VIEW COMPARISON:  09/19/2014 FINDINGS: Midline trachea. Borderline cardiomegaly. No pleural effusion or pneumothorax. Low lung volumes with resultant pulmonary  interstitial prominence. No congestive failure. No lobar consolidation. Mild atelectasis at both lung bases, likely due to hypoventilation. IMPRESSION: Low lung volumes, without acute disease. Electronically Signed   By: Jeronimo Greaves M.D.   On: 07/10/2020 15:40    Procedures Procedures   Medications Ordered in ED Medications  sodium chloride 0.9 % bolus 1,000 mL (has no administration in time range)    ED Course  I have reviewed the triage vital signs and the nursing notes.  Pertinent labs & imaging results that were available during my care of the patient were reviewed by me and considered in my medical decision making (see chart for details).    MDM Rules/Calculators/A&P  patient with recent opioid overdose, discharged earlier today here for evaluation following unresponsive episode. He was treated with Narcan prior to ED presentation. Patient is drowsy in the emergency department but does protect his airway and awakens to verbal stimuli. He is noted to have a fever to 101.3. His skin does not feel overly hot. Unclear if this is environmental or related to aspiration versus infectious process. Will continue to monitor at this time. Patient care transferred pending reassessment.  Final Clinical Impression(s) / ED Diagnoses Final diagnoses:  None    Rx / DC Orders ED Discharge Orders     None        Tilden Fossa, MD 07/11/20 2329

## 2020-07-11 NOTE — Progress Notes (Signed)
NAME:  Miguel [name], MRN:  [id number], DOB:  [birth year], LOS: 1 ADMISSION DATE:  07/10/2020,  CHIEF COMPLAINT: Opioid intoxication  History of Present Illness:  30 year old man with a history of polysubstance abuse including cocaine, opiates, benzodiazepines who is presenting with altered mental status.  Symptoms started 6/12 after he snorted an unknown substance from his cousin.  He was at his home with a cousin when the event occurred.  He asked his cousin for some Xanax to relax his nerves but the cousin only had some sort of powder in which he snorted up his nose.  He does not know what was in the powder he thinks it Ice laced with other drugs.  After he snorted the drug he became unresponsive and was brought in to the emergency department via EMS.  During his transport emergency services administered 1 mg of Narcan with improvement in his mental status.  In the emergency department he became somnolent again and was given Narcan 4 mg with good response.  He was in his normal state of health prior to snorting the unknown substance.  No chest pain, fever, rash.  No vomiting.  No abdominal pain.  He has 4 kids and works a lot and is under a lot of pressure.  He is not suicidal.   Pertinent  Medical History  Polysubstance abuse: Cocaine, benzodiazepines, opiates Schizophrenia, bipolar disorder  Subjective   Patient asking to go home  Denies suicidal / homicidal intent On Narcan gtt at 2mg /hr  Labs pending   Objective   Blood pressure 111/68, pulse 75, temperature 97.9 F (36.6 C), temperature source Oral, resp. rate 15, height 5\' 6"  (1.676 m), weight 115.4 kg, SpO2 100 %.        Intake/Output Summary (Last 24 hours) at 07/11/2020 0742 Last data filed at 07/11/2020 0600 Gross per 24 hour  Intake 678.68 ml  Output 2000 ml  Net -1321.32 ml   Filed Weights   07/10/20 1400 07/10/20 2200  Weight: 117.9 kg 115.4 kg    Examination: General:  adult male lying in bed in NAD HEENT: MM  pink/moist, anicteric Neuro: Awakens to voice, speech clear, MAE CV: s1s2 RRR, no m/r/g PULM:  non-labored on RA, occasional wheeze on left, clear on right (reports active smoking) GI: soft, bsx4 active  Extremities: warm/dry, no edema, venous stasis   Skin: no rashes or lesions  Assessment & Plan:  67 old male with history of polysubstance abuse including cocaine, opiates, benzodiazepine presenting with toxidrome of somnolence, bradypnea with a respiratory rate of less than 10, and pinpoint pupils after he snorted a powder substance. He thinks the powder was ice that was laced with other drugs.  Improved mental status with Narcan.  CK minimally elevated, lactic acid negative.   Opioid intoxication 2.   Acute Toxic Encephalopathy  3.   Acute hypercapnic/hypoxemic respiratory failure 3.   Leukocytosis 4.   Elevated liver function tests  -stop narcan gtt and assess mental status  -ambulate patient in hall with assist  -advance diet to regular  -await am labs, pending review, may be able to discharge home  -patient initially requesting to leave AMA, reviewed process with him.  Hopeful he will stay to finish work up -await UDS, UA -continue lovenox for DVT prophylaxis  -substance abuse counseling, resources requested to be given for discharge by [facility]  Best practice (right click and "Reselect all SmartList Selections" daily)  Diet:  Oral Pain/Anxiety/Delirium protocol (if indicated): No VAP protocol (if indicated):  Not indicated DVT prophylaxis: LMWH GI prophylaxis: N/A Central venous access:  N/A Arterial line:  N/A Foley:  N/A Mobility:  bed rest  PT consulted: N/A Code Status:  full code Disposition: ICU, may be able to discharge pending lab / physical assessment review        Canary Brim, MSN, APRN, NP-C, AGACNP-BC [facility] 07/11/2020, 7:42 AM   Please see [email or web address] for pager details.   From 7A-7P if no response, please call [phone] After  hours, please call ELink [phone]

## 2020-07-11 NOTE — Progress Notes (Signed)
When the patient arrived to our unit, he refused to be turned or touched by staff members.   Patient's RN, Eustaquio Boyden, found a small container with unknown substance wrapped in plastic without labels nor other identifiers. RN informed me, charge nurse, of findings and I have called security and the administrative coordinator to inquire about the next steps.  I have placed the container into a sealed biohazard bag and will inform management about it when they arrive on the unit.

## 2020-07-11 NOTE — ED Notes (Signed)
ED Provider at bedside. 

## 2020-07-11 NOTE — Discharge Summary (Signed)
Physician Discharge Summary  Patient ID: Miguel [name] MRN: [id number] DOB/AGE: 1990/02/13 30 y.o.  Admit date: 07/10/2020 Discharge date: 07/11/2020    Discharge Diagnoses:  Overdose / Opioid Intoxication  Acute Toxic Encephalopathy  Acute Hypercapnic / Hypoxemic Respiratory Failure  Leukocytosis  Elevated Liver Function Tests  Reported hx of Schizophrenia / Bipolar Depression  Venous Stasis of LE's                                                                       DISCHARGE PLAN BY DIAGNOSIS      Overdose / Opioid Intoxication  Acute Toxic Encephalopathy  Acute Hypercapnic / Hypoxemic Respiratory Failure  Leukocytosis  Elevated Liver Function Tests  Reported hx of Schizophrenia / Bipolar Depression  Venous Stasis of LE's  Discharge Plan: -substance abuse counseling cessation provided prior to discharge  -patient encouraged to establish a primary MD and seek help for polysubstance abuse  -information given for [facility] for primary care / hospital follow up  -patient encouraged to find a counselor to assist with mental health care  -follow up with PCP regarding venous stasis, LFT's                        DISCHARGE SUMMARY   30 y/o M with a known history of polysubstance abuse (opiates, benzodiazepines, and cocaine) admitted 6/12 after presenting to [facility] ER via EMS with altered mental status after snorting an unknown substance.  Pt reports history of schizophrenia and bipolar depression.   The patient reportedly was given an unknown powdery substance he thought to be "ice".  He snorted the powder hoping for "anxiety relief".  He states he had a daughter die in 2010 and he has had periods of depression since.  After he snorted the powder, he became unresponsive and EMS was called.  He was given Narcan in route to the ER with improvement in his mental status.  While in the ER, he became somnolent again and was started on a Narcan infusion.   Prior to the episode, he was in his usual state of health.  States he has 4 children, works a lot and feels like he is under a lot of pressure. He was initially hypoxic thought to be related to alveolar hypoventilation and atelectasis. CXR was assessed which was negative for acute infiltrate.  Labs revealed elevated WBC & LFT's. CK minimally elevated. HIV, acute hepatitis panel, lactate negative. Hemodynamics were stable.  No history of trauma / falls. The patient was admitted to ICU for further evaluation.  Narcan gtt was stopped am of 6/13 and mental status remained normal.  He was ambulated in the hall without difficulty. He requested to go home, follow up labs were improved.  Support offered to patient. He was medically cleared 6/13 for discharge with plans as above.                 SIGNIFICANT DIAGNOSTIC STUDIES CXR 6/12 >> mild cardiomegaly, no acute infiltrate  MICRO DATA  MRSA PCR 6/12 >> positive  COVID / Influenza 6/12 >> negative   CONSULTS Transitions of Care - for substance abuse counseling options    Discharge Exam: General: adult male lying in bed  in NAD  Neuro:AAOx4, speech clear, denies suicidal / homicidal intent  CV: s1s2 RRR, no m/r/r PULM: non-labored on RA, occasional wheeze on left, clear on right  GI: tolerating PO's Extremities: warm/dry, multiple tattoos  Vitals:   07/11/20 0800 07/11/20 0900 07/11/20 1000 07/11/20 1100  BP: 116/73 112/77 134/80 133/81  Pulse:  77 95 86  Resp:  16 (!) 25 20  Temp:      TempSrc:      SpO2:  95% 93% (!) 88%  Weight:      Height:         Discharge Labs  BMET Recent Labs  Lab 07/10/20 1644 07/10/20 2159 07/11/20 0811  NA 138  --  136  K 3.6  --  3.5  CL 100  --  100  CO2 29  --  30  GLUCOSE 65*  --  83  BUN 11  --  5*  CREATININE 1.05  --  0.69  CALCIUM 8.6*  --  8.8*  MG  --  1.9  --   PHOS  --  3.4  --     CBC Recent Labs  Lab 07/10/20 1644 07/11/20 0811  HGB 15.6 14.1  HCT 47.2 43.4  WBC 29.1*  10.6*  PLT 254 197      Allergies as of 07/11/2020       Reactions   Tramadol Hives   Vicodin [hydrocodone-acetaminophen] Nausea Only        Medication List    You have not been prescribed any medications.       Disposition: Home.  Substance abuse counseling information provided.   Discharged Condition: Miguel [name] has met maximum benefit of inpatient care and is medically stable and cleared for discharge.  Patient is pending follow up as above.      Time spent on disposition:  Greater than 35  Minutes.   Signed: Noe Gens, MSN, APRN, NP-C, AGACNP-BC [facility] 07/11/2020, 11:44 AM   Please see [email or web address] for pager details.   From 7A-7P if no response, please call [phone] After hours, please call ELink [phone]

## 2020-07-11 NOTE — Progress Notes (Signed)
CSW received consult for substance use resources. CSW met with patient at bedside. CSW offered patient outpatient substance use treatment services resources. Patient accepted.

## 2020-07-11 NOTE — Progress Notes (Signed)
Patient discharged to home with wife and friend at bedside. Patient transported off unit with Nurse Tech with all patient belongings. Patient instructed on discharge instructions and resources to follow up on.

## 2020-07-11 NOTE — ED Triage Notes (Signed)
Pt arrive POV with friend stating pt was not responsive and foaming at mouth. Pt was given 2 mg Narcan intranasal by friend PTA. Pt will open his eyes to verbal and gave [place] his name and birthday. Pt was at [facility] yesterday for overdose.

## 2020-07-12 ENCOUNTER — Emergency Department (HOSPITAL_BASED_OUTPATIENT_CLINIC_OR_DEPARTMENT_OTHER): Payer: Medicaid Other

## 2020-07-12 ENCOUNTER — Encounter (HOSPITAL_COMMUNITY): Payer: Self-pay | Admitting: Internal Medicine

## 2020-07-12 ENCOUNTER — Other Ambulatory Visit: Payer: Self-pay | Admitting: Internal Medicine

## 2020-07-12 DIAGNOSIS — Z6841 Body Mass Index (BMI) 40.0 and over, adult: Secondary | ICD-10-CM | POA: Diagnosis not present

## 2020-07-12 DIAGNOSIS — I10 Essential (primary) hypertension: Secondary | ICD-10-CM | POA: Diagnosis present

## 2020-07-12 DIAGNOSIS — E876 Hypokalemia: Secondary | ICD-10-CM | POA: Diagnosis present

## 2020-07-12 DIAGNOSIS — R4182 Altered mental status, unspecified: Secondary | ICD-10-CM | POA: Diagnosis present

## 2020-07-12 DIAGNOSIS — Z885 Allergy status to narcotic agent status: Secondary | ICD-10-CM | POA: Diagnosis not present

## 2020-07-12 DIAGNOSIS — F1910 Other psychoactive substance abuse, uncomplicated: Secondary | ICD-10-CM

## 2020-07-12 DIAGNOSIS — Z818 Family history of other mental and behavioral disorders: Secondary | ICD-10-CM | POA: Diagnosis not present

## 2020-07-12 DIAGNOSIS — R400 Somnolence: Secondary | ICD-10-CM | POA: Diagnosis not present

## 2020-07-12 DIAGNOSIS — G9340 Encephalopathy, unspecified: Secondary | ICD-10-CM | POA: Diagnosis present

## 2020-07-12 DIAGNOSIS — T50901A Poisoning by unspecified drugs, medicaments and biological substances, accidental (unintentional), initial encounter: Secondary | ICD-10-CM | POA: Diagnosis present

## 2020-07-12 DIAGNOSIS — Z9151 Personal history of suicidal behavior: Secondary | ICD-10-CM | POA: Diagnosis not present

## 2020-07-12 DIAGNOSIS — Z5900 Homelessness unspecified: Secondary | ICD-10-CM | POA: Diagnosis not present

## 2020-07-12 DIAGNOSIS — F4310 Post-traumatic stress disorder, unspecified: Secondary | ICD-10-CM | POA: Diagnosis present

## 2020-07-12 DIAGNOSIS — J690 Pneumonitis due to inhalation of food and vomit: Secondary | ICD-10-CM | POA: Diagnosis present

## 2020-07-12 DIAGNOSIS — Z20822 Contact with and (suspected) exposure to covid-19: Secondary | ICD-10-CM | POA: Diagnosis present

## 2020-07-12 DIAGNOSIS — G928 Other toxic encephalopathy: Secondary | ICD-10-CM | POA: Diagnosis present

## 2020-07-12 LAB — CBC
HCT: 41.9 % (ref 39.0–52.0)
Hemoglobin: 13.6 g/dL (ref 13.0–17.0)
MCH: 29.2 pg (ref 26.0–34.0)
MCHC: 32.5 g/dL (ref 30.0–36.0)
MCV: 90.1 fL (ref 80.0–100.0)
Platelets: 185 10*3/uL (ref 150–400)
RBC: 4.65 MIL/uL (ref 4.22–5.81)
RDW: 13.1 % (ref 11.5–15.5)
WBC: 10.1 10*3/uL (ref 4.0–10.5)
nRBC: 0 % (ref 0.0–0.2)

## 2020-07-12 LAB — PROTIME-INR
INR: 1.1 (ref 0.8–1.2)
Prothrombin Time: 14 seconds (ref 11.4–15.2)

## 2020-07-12 LAB — RESP PANEL BY RT-PCR (FLU A&B, COVID) ARPGX2
Influenza A by PCR: NEGATIVE
Influenza B by PCR: NEGATIVE
SARS Coronavirus 2 by RT PCR: NEGATIVE

## 2020-07-12 LAB — BASIC METABOLIC PANEL
Anion gap: 6 (ref 5–15)
BUN: 7 mg/dL (ref 6–20)
CO2: 29 mmol/L (ref 22–32)
Calcium: 8.3 mg/dL — ABNORMAL LOW (ref 8.9–10.3)
Chloride: 103 mmol/L (ref 98–111)
Creatinine, Ser: 0.68 mg/dL (ref 0.61–1.24)
GFR, Estimated: >60 mL/min (ref >60)
Glucose, Bld: 117 mg/dL — ABNORMAL HIGH (ref 70–99)
Potassium: 3.4 mmol/L — ABNORMAL LOW (ref 3.5–5.1)
Sodium: 138 mmol/L (ref 135–145)

## 2020-07-12 LAB — GLUCOSE, CAPILLARY: Glucose-Capillary: 106 mg/dL — ABNORMAL HIGH (ref 70–99)

## 2020-07-12 LAB — MAGNESIUM: Magnesium: 1.9 mg/dL (ref 1.7–2.4)

## 2020-07-12 LAB — LACTIC ACID, PLASMA: Lactic Acid, Venous: 1 mmol/L (ref 0.5–1.9)

## 2020-07-12 LAB — HEPATITIS B SURFACE ANTIBODY, QUANTITATIVE: Hep B S AB Quant (Post): <3.1 m[IU]/mL — ABNORMAL LOW (ref >9.9)

## 2020-07-12 LAB — PHOSPHORUS: Phosphorus: 2.3 mg/dL — ABNORMAL LOW (ref 2.5–4.6)

## 2020-07-12 LAB — SAR COV2 SEROLOGY (COVID19)AB(IGG),IA: SARS-CoV-2 Ab, IgG: REACTIVE — AB

## 2020-07-12 MED ORDER — NALOXONE HCL 2 MG/2ML IJ SOSY
PREFILLED_SYRINGE | INTRAMUSCULAR | Status: AC
  Filled 2020-07-12: qty 4

## 2020-07-12 MED ORDER — HEPARIN SODIUM (PORCINE) 5000 UNIT/ML IJ SOLN
5000.0000 [IU] | Freq: Three times a day (TID) | INTRAMUSCULAR | Status: DC
  Administered 2020-07-12 – 2020-07-14 (×7): 5000 [IU] via SUBCUTANEOUS
  Filled 2020-07-12 (×5): qty 1

## 2020-07-12 MED ORDER — NALOXONE HCL 0.4 MG/ML IJ SOLN
0.4000 mg | Freq: Once | INTRAMUSCULAR | Status: AC
  Administered 2020-07-12: 0.4 mg via INTRAVENOUS
  Filled 2020-07-12: qty 1

## 2020-07-12 MED ORDER — SODIUM CHLORIDE 0.9 % IV SOLN
3.0000 g | Freq: Three times a day (TID) | INTRAVENOUS | Status: DC
  Administered 2020-07-12: 3 g via INTRAVENOUS
  Filled 2020-07-12 (×2): qty 8

## 2020-07-12 MED ORDER — SODIUM CHLORIDE 0.9 % IV SOLN
3.0000 g | Freq: Four times a day (QID) | INTRAVENOUS | Status: DC
  Administered 2020-07-12 – 2020-07-14 (×8): 3 g via INTRAVENOUS
  Filled 2020-07-12 (×4): qty 8
  Filled 2020-07-12 (×3): qty 3
  Filled 2020-07-12 (×2): qty 8

## 2020-07-12 MED ORDER — NALOXONE HCL 4 MG/10ML IJ SOLN
0.2500 mg/h | INTRAVENOUS | Status: DC
  Administered 2020-07-12: 0.25 mg/h via INTRAVENOUS
  Filled 2020-07-12: qty 10

## 2020-07-12 MED ORDER — NICOTINE 21 MG/24HR TD PT24
21.0000 mg | MEDICATED_PATCH | Freq: Every day | TRANSDERMAL | Status: DC
  Administered 2020-07-12 – 2020-07-14 (×3): 21 mg via TRANSDERMAL
  Filled 2020-07-12 (×3): qty 1

## 2020-07-12 MED ORDER — SODIUM CHLORIDE 0.9 % IV BOLUS
1000.0000 mL | Freq: Once | INTRAVENOUS | Status: AC
  Administered 2020-07-12: 1000 mL via INTRAVENOUS

## 2020-07-12 MED ORDER — SODIUM CHLORIDE 0.9 % IV SOLN: INTRAVENOUS | Status: DC | PRN

## 2020-07-12 MED ORDER — DOCUSATE SODIUM 100 MG PO CAPS: 100.0000 mg | ORAL_CAPSULE | Freq: Two times a day (BID) | ORAL | Status: DC | PRN

## 2020-07-12 MED ORDER — CHLORHEXIDINE GLUCONATE CLOTH 2 % EX PADS
6.0000 | MEDICATED_PAD | Freq: Every day | CUTANEOUS | Status: DC
  Administered 2020-07-12 – 2020-07-14 (×3): 6 via TOPICAL

## 2020-07-12 MED ORDER — POLYETHYLENE GLYCOL 3350 17 G PO PACK: 17.0000 g | PACK | Freq: Every day | ORAL | Status: DC | PRN

## 2020-07-12 NOTE — Progress Notes (Signed)
eLink Physician-Brief Progress Note Patient Name: Miguel [name] DOB: 08/14/1990 MRN: [id number]   Date of Service  07/12/2020  HPI/Events of Note  10M who was admitted to [facility] on 6/12 for opioid overdose requiring Narcan drip and monitoring in ICU then discharged earlier today, now representing just a few hours after discharge with recurrent overdose and again requiring Narcan drip.  Currently on Narcan infusion at 0.25 mg/hr. Sleeping, but wakes easily in response to voice. Vitals stable and saturating high 90s on room air.   eICU Interventions  Continue Narcan infusion at present rate. Will wean later today.  DVT PPX: Heparin [place]. GI PPX: Not indicated.     Intervention Category Evaluation Type: New Patient Evaluation  Janae Bridgeman 07/12/2020, 6:15 AM

## 2020-07-12 NOTE — H&P (Addendum)
NAME:  Miguel [name], MRN:  [id number], DOB:  1990/10/14, LOS: 0 ADMISSION DATE:  07/11/2020, CONSULTATION DATE:  07/12/20 REFERRING MD:  EDP, CHIEF COMPLAINT:  Altered mental status   History of Present Illness:  30 year old male with past medical history of polysubstance abuse and possible schizophrenia/bipolar depression who was just discharged for overdose on 6/13 and returned overnight after being found foaming at the mouth and altered.  He had a negative head CT, urine drug screen is pending, lactic acid mildly elevated at 2.1.   He responded well to Narcan but required Narcan drip and ICU admission.  This morning he is responsive to voice and asking to be left alone.  Pertinent  Medical History   has a past medical history of Bipolar 1 disorder (HCC), Depression, Hand fracture, left (01/30/2008), Hand fracture, right (01/29/2006), IV drug user, and Schizophrenia (HCC).   Significant Hospital Events: Including procedures, antibiotic start and stop dates in addition to other pertinent events   6/14 admit to ICU on Narcan drip  Interim History / Subjective:  Protecting his airway on room air and mental status improving.  Objective   Blood pressure 123/76, pulse 68, temperature 99.4 F (37.4 C), temperature source Oral, resp. rate 12, height 5\' 6"  (1.676 m), weight 115.4 kg, SpO2 97 %.        Intake/Output Summary (Last 24 hours) at 07/12/2020 0717 Last data filed at 07/12/2020 0318 Gross per 24 hour  Intake 2114.63 ml  Output --  Net 2114.63 ml   Filed Weights   07/11/20 2149  Weight: 115.4 kg   General:   HEENT: MM pink/moist Neuro: Resting comfortably on Narcan, arousable to voice, moving all extremities CV: s1s2 RRR, no m/r/g PULM: Protecting his airway on room air, no rhonchi or wheezing, CTA B, no retractions or tachypnea GI: soft, bsx4 active  Extremities: warm/dry, 2+ edema  Skin: no rashes or lesions  Labs/imaging that I havepersonally reviewed  (right  click and "Reselect all SmartList Selections" daily)  Lactic acid 2.1-> 1.0 CBC BMP CT head>> no acute findings  Resolved Hospital Problem list     Assessment & Plan:   Altered mental status likely secondary to repeated drug overdose in the setting of known polysubstance abuse, reported history of schizophrenia and bipolar disorder Prior drug screens positive for opiates, cocaine, benzos.  Reportedly snorted unknown powdery substance thought to be "ice" before admission earlier this week.  Found foaming at the mouth by housemate before admission overnight P: -Continue Narcan drip, monitor mental status and respiratory status -Drug screen -Once patient is conversational consult psychiatry -Was given outpatient resources upon discharge yesterday   Possible Aspiration PNA Low lung volumes and possible aspiration on presenting CXR, febrile though no significant leukocytosis P: -No hypoxia, on room air, continue Unasyn for now likely transition to oral antibiotics tomorrow or at discharge   Best practice (right click and "Reselect all SmartList Selections" daily)  Diet:  NPO Pain/Anxiety/Delirium protocol (if indicated): No VAP protocol (if indicated): Not indicated DVT prophylaxis: Subcutaneous Heparin GI prophylaxis: N/A Glucose control:  SSI No Central venous access:  N/A Arterial line:  N/A Foley:  N/A Mobility:  bed rest  PT consulted: N/A Last date of multidisciplinary goals of care discussion [pending] Code Status:  full code Disposition: ICU  Labs   CBC: Recent Labs  Lab 07/10/20 1644 07/11/20 0811 07/11/20 2158  WBC 29.1* 10.6* 11.8*  NEUTROABS 25.8*  --  10.4*  HGB 15.6 14.1 15.4  HCT 47.2  43.4 46.6  MCV 89.1 89.1 88.8  PLT 254 197 219    Basic Metabolic Panel: Recent Labs  Lab 07/10/20 1644 07/10/20 2159 07/11/20 0811 07/11/20 2158  NA 138  --  136 136  K 3.6  --  3.5 4.2  CL 100  --  100 96*  CO2 29  --  30 33*  GLUCOSE 65*  --  83 146*  BUN  11  --  5* 10  CREATININE 1.05  --  0.69 1.25*  CALCIUM 8.6*  --  8.8* 8.9  MG  --  1.9  --   --   PHOS  --  3.4  --   --    GFR: Estimated Creatinine Clearance: 103.2 mL/min (A) (by C-G formula based on SCr of 1.25 mg/dL (H)). Recent Labs  Lab 07/10/20 1644 07/10/20 2159 07/11/20 0811 07/11/20 2154 07/11/20 2158 07/12/20 0047  WBC 29.1*  --  10.6*  --  11.8*  --   LATICACIDVEN  --  0.9  --  2.1*  --  1.0    Liver Function Tests: Recent Labs  Lab 07/10/20 1644 07/11/20 0811 07/11/20 2158  AST 303* 128* 103*  ALT 285* 197* 195*  ALKPHOS 67 55 71  BILITOT 0.9 1.2 0.6  PROT 6.7 6.1* 7.4  ALBUMIN 3.9 3.5 4.3   Recent Labs  Lab 07/10/20 2159  LIPASE 151*   No results for input(s): AMMONIA in the last 168 hours.  ABG No results found for: PHART, PCO2ART, PO2ART, HCO3, TCO2, ACIDBASEDEF, O2SAT   Coagulation Profile: Recent Labs  Lab 07/12/20 0350  INR 1.1    Cardiac Enzymes: Recent Labs  Lab 07/10/20 2159  CKTOTAL 99    HbA1C: No results found for: HGBA1C  CBG: Recent Labs  Lab 07/10/20 2048 07/11/20 0033 07/11/20 0424 07/11/20 0739 07/11/20 2213  GLUCAP 98 94 107* 80 151*    Review of Systems:   Unable to obtain secondary to altered mental status  Past Medical History:  He,  has a past medical history of Bipolar 1 disorder (HCC), Depression, Hand fracture, left (01/30/2008), Hand fracture, right (01/29/2006), IV drug user, and Schizophrenia (HCC).   Surgical History:   Past Surgical History:  Procedure Laterality Date   CYST EXCISION     left hand fracture Left 2010   Dr Magnus Ivan     Social History:   reports that he has never smoked. He has never used smokeless tobacco. He reports that he does not drink alcohol and does not use drugs.   Family History:  His family history includes Bipolar disorder in his mother; Schizophrenia in his mother and sister.   Allergies Allergies  Allergen Reactions   Tramadol Hives   Vicodin  [Hydrocodone-Acetaminophen] Nausea Only     Home Medications  Prior to Admission medications   Not on File     Critical care time: 40 minutes   CRITICAL CARE Performed by: Darcella Gasman Lumir Demetriou   Total critical care time: 40 minutes  Critical care time was exclusive of separately billable procedures and treating other patients.  Critical care was necessary to treat or prevent imminent or life-threatening deterioration.  Critical care was time spent personally by me on the following activities: development of treatment plan with patient and/or surrogate as well as nursing, discussions with consultants, evaluation of patient's response to treatment, examination of patient, obtaining history from patient or surrogate, ordering and performing treatments and interventions, ordering and review of laboratory studies, ordering and review of  radiographic studies, pulse oximetry and re-evaluation of patient's condition.    Darcella Gasman Gerlene Glassburn, PA-C [facility] See Amion for pager If no response to pager , please call [phone] until 7pm After 7:00 pm call Elink  [phone]

## 2020-07-12 NOTE — ED Provider Notes (Addendum)
Sign out note  30 year old gentleman presented to ER  12:12 AM discussed with patient's friend, [phone] Montez Morita  1:00 AM patient persistently drowsy, now requiring small supplemental O2, remains borderline hypotensive despite fluids, will give dose of Narcan and reassess.  Patient had significant improvement in mental status after Narcan  2:00 AM more drowsy again, will give additional Narcan  2:27 AM reassessed, had transient response to Narcan after second dose but now more somnolent again.  Will start on Narcan drip.  Will start antibiotics in case of possible aspiration based on chest x-ray and the initial fever.  BP improved after fluids, lactate improved after fluids.  Will admit to hospitalist service for further management.  Hosp can't take naracan drip, will admit to CCM  3:27 AM discussed with crit care - Pecola Leisure - he cared for patient on his last admission. He will accept. Recommends checking CT head for now.      CRITICAL CARE Performed by: Milagros Loll   Total critical care time: 42 minutes  Critical care time was exclusive of separately billable procedures and treating other patients.  Critical care was necessary to treat or prevent imminent or life-threatening deterioration.  Critical care was time spent personally by me on the following activities: development of treatment plan with patient and/or surrogate as well as nursing, discussions with consultants, evaluation of patient's response to treatment, examination of patient, obtaining history from patient or surrogate, ordering and performing treatments and interventions, ordering and review of laboratory studies, ordering and review of radiographic studies, pulse oximetry and re-evaluation of patient's condition.       Milagros Loll, MD 07/12/20 3235    Milagros Loll, MD 07/12/20 [phone]

## 2020-07-12 NOTE — Progress Notes (Signed)
Patient's SPO2 dropped to 85% while asleep.  Placed patient on 3 liter nasal cannula.  Patient's SPO2 increased to 95%.

## 2020-07-12 NOTE — Progress Notes (Signed)
Pt arrived to [facility] ICU via [facility]  from [facility]. Pt alert, room air, speaking to MD on Elink. IV Narcan drip running at 0.25 mg/hr.

## 2020-07-12 NOTE — Progress Notes (Signed)
PHARMACY NOTE:  ANTIMICROBIAL RENAL DOSAGE ADJUSTMENT  Current antimicrobial regimen includes a mismatch between antimicrobial dosage and estimated renal function.  As per policy approved by the Pharmacy & Therapeutics and Medical Executive Committees, the antimicrobial dosage will be adjusted accordingly.  Current antimicrobial dosage:  unasyn 3gm q8h  Indication: aspiration PNA  Renal Function:  Estimated Creatinine Clearance: 103.2 mL/min (A) (by C-G formula based on SCr of 1.25 mg/dL (H)). []      On intermittent HD, scheduled: []      On CRRT    Antimicrobial dosage has been changed to:  unasyn 3gm q6h    Thank you for allowing pharmacy to be a part of this patient's care.  , [facility] 07/12/2020 6:14 AM

## 2020-07-12 NOTE — Progress Notes (Signed)
Patient is  easily arousable and able to answer questions appropriately, however remains drowsy. He does get irritable upon wakening.

## 2020-07-13 LAB — BASIC METABOLIC PANEL
Anion gap: 4 — ABNORMAL LOW (ref 5–15)
BUN: 8 mg/dL (ref 6–20)
CO2: 32 mmol/L (ref 22–32)
Calcium: 8.4 mg/dL — ABNORMAL LOW (ref 8.9–10.3)
Chloride: 102 mmol/L (ref 98–111)
Creatinine, Ser: 0.65 mg/dL (ref 0.61–1.24)
GFR, Estimated: >60 mL/min (ref >60)
Glucose, Bld: 112 mg/dL — ABNORMAL HIGH (ref 70–99)
Potassium: 3.8 mmol/L (ref 3.5–5.1)
Sodium: 138 mmol/L (ref 135–145)

## 2020-07-13 LAB — RAPID URINE DRUG SCREEN, HOSP PERFORMED
Amphetamines: NOT DETECTED
Barbiturates: NOT DETECTED
Benzodiazepines: NOT DETECTED
Cocaine: NOT DETECTED
Opiates: NOT DETECTED
Tetrahydrocannabinol: NOT DETECTED

## 2020-07-13 LAB — CBC
HCT: 44.2 % (ref 39.0–52.0)
Hemoglobin: 14.1 g/dL (ref 13.0–17.0)
MCH: 28.7 pg (ref 26.0–34.0)
MCHC: 31.9 g/dL (ref 30.0–36.0)
MCV: 89.8 fL (ref 80.0–100.0)
Platelets: 212 10*3/uL (ref 150–400)
RBC: 4.92 MIL/uL (ref 4.22–5.81)
RDW: 13 % (ref 11.5–15.5)
WBC: 8.9 10*3/uL (ref 4.0–10.5)
nRBC: 0 % (ref 0.0–0.2)

## 2020-07-13 MED ORDER — MAGNESIUM SULFATE 2 GM/50ML IV SOLN
2.0000 g | Freq: Once | INTRAVENOUS | Status: AC
  Administered 2020-07-13: 2 g via INTRAVENOUS
  Filled 2020-07-13: qty 50

## 2020-07-13 MED ORDER — ACETAMINOPHEN 325 MG PO TABS
650.0000 mg | ORAL_TABLET | Freq: Four times a day (QID) | ORAL | Status: DC | PRN
  Administered 2020-07-13 – 2020-07-14 (×2): 650 mg via ORAL
  Filled 2020-07-13 (×2): qty 2

## 2020-07-13 MED ORDER — POTASSIUM PHOSPHATES 15 MMOLE/5ML IV SOLN
9.0000 mmol | Freq: Once | INTRAVENOUS | Status: AC
  Administered 2020-07-13: 9 mmol via INTRAVENOUS
  Filled 2020-07-13: qty 3

## 2020-07-13 NOTE — Progress Notes (Signed)
NAME:  Miguel [name], MRN:  [id number], DOB:  [birth year], LOS: 1 ADMISSION DATE:  07/11/2020, CONSULTATION DATE:  07/13/20 REFERRING MD:  EDP, CHIEF COMPLAINT:  Altered mental status   History of Present Illness:  30 year old male with PMHx significant for polysubstance abuse and possible schizophrenia/bipolar depression, recently discharged after admission for overdose 6/13. Returned overnight after being found by housemate, foaming at the mouth with AMS.   On presentation to ED, CT Head negative, LA mildly elevated at 2.1. UDS not collected due to lack of sample. Patient initially responded well to Narcan, but ultimately required Narcan gtt and ICU admission.  Pertinent Medical History:   has a past medical history of Bipolar 1 disorder (HCC), Depression, Hand fracture, left (01/30/2008), Hand fracture, right (01/29/2006), IV drug user, and Schizophrenia (HCC).   Significant Hospital Events: Including procedures, antibiotic start and stop dates in addition to other pertinent events   6/14 Admit to ICU on Narcan drip 6/15 Off of Narcan gtt, improved mental status  Interim History / Subjective:  No significant events overnight C/o significant headache Feels sleepy, but answering questions appropriately Expresses desire to go to a rehab facility at discharge  Objective   Blood pressure (!) 141/96, pulse 79, temperature 97.8 F (36.6 C), temperature source Oral, resp. rate 11, height 5\' 6"  (1.676 m), weight 114.7 kg, SpO2 97 %.        Intake/Output Summary (Last 24 hours) at 07/13/2020 0915 Last data filed at 07/13/2020 0600 Gross per 24 hour  Intake 1487.07 ml  Output 3325 ml  Net -1837.93 ml   Filed Weights   07/11/20 2149 07/13/20 0410  Weight: 115.4 kg 114.7 kg   Physical Examination: General: Acutely ill-appearing adult male in NAD. HEENT: [place]/AT, anicteric sclera, PERRL, moist mucous membranes. Neuro: Awake, oriented x 4. Responds to verbal stimuli. Following  commands consistently. Moves all 4 extremities spontaneously.  CV: RRR, no m/g/r. PULM: Breathing even and unlabored on RA. Lung fields CTAB. GI: Soft, nontender, nondistended. Normoactive bowel sounds. Extremities: No LE edema noted. Skin: Warm/dry, no rashes.  Labs/imaging that I havepersonally reviewed: (right click and "Reselect all SmartList Selections" daily)  CT Head 6/14 negative  WBC 8.9, H&H 14.1/28.4, Plt 212  Na 138, K 3.8, Cl 103, CO2 31, BUN 8, Cr 0.65 Ca 8.4  Glucoses 80-151 last [facility]  Resolved Hospital Problem List     Assessment & Plan:   Altered mental status likely secondary to repeated drug overdose in the setting of known polysubstance abuse, reported history of schizophrenia and bipolar disorder History of EtOH abuse, prior drug screens positive for opiates, cocaine, benzos Reportedly snorted unknown powdery substance thought to be "ice" before admission earlier this week.  Found foaming at the mouth by housemate prior to admission. - Weaned off of Narcan gtt - Monitor mental status/respiratory status (improving) - F/u UDS (pending) - Psychiatry consult today 6/15, now that patient is awake/able to participate in care - Outpatient resources for substance abuse provided upon previous discharge 6/13 - Patient expresses wish to go to rehab facility at discharge, updated TOC team  Possible Aspiration PNA in the setting of AMS Low lung volumes and possible aspiration on presenting CXR, febrile though no significant leukocytosis. - Continue to monitor respiratory status, remains on RA - Continue Unasyn - Transition to PO antibiotics today, 6/15 vs. discharge  Best Practice: (right click and "Reselect all SmartList Selections" daily)  Diet:  Oral Pain/Anxiety/Delirium protocol (if indicated): No VAP protocol (if indicated): Not indicated  DVT prophylaxis: Subcutaneous Heparin GI prophylaxis: N/A Glucose control:  SSI No Central venous access:  N/A Arterial  line:  N/A Foley:  N/A Mobility:  OOB  PT consulted: N/A Last date of multidisciplinary goals of care discussion [Pending] Code Status:  full code Disposition: ICU  Critical care time: N/A   Faythe Ghee [facility] 07/13/20 9:16 AM  Please see [email or web address] for pager details.  From 7A-7P if no response, please call [phone] After hours, please call ELink [phone]

## 2020-07-13 NOTE — TOC Initial Note (Signed)
Transition of Care [facility]) - Initial/Assessment Note    Patient Details  Name: Miguel [name] MRN: [id number] Date of Birth: 09/28/1990  [facility]) CM/SW Contact:    Golda Acre, RN Phone Number: 07/13/2020, 8:53 AM  Clinical Narrative:                 Patient with a history of polysubstance abuse, bipolar/schizophrenia discharge following management for drug overdose on 613 back within 24 hours  Currently on a Narcan drip  Is responsive but easily falls back asleep     SNIPPING with dynamics Moist oral mucosa S1-S2 appreciated Appears able to protect his airway with clear breath sounds Bowel sounds appreciated Mild edema     Labs reviewed     Chest x-ray with hypoventilated lung fields        Assessment/plan Polysubstance abuse Continue Narcan drip at present -We will consult psychiatry once patient is more alert and interactive  -We will need some resources following discharge to try and guard against immediate readmission  Possible aspiration pneumonia -Continue Unasyn -Transition to oral antibiotics once able  Risk of decompensation Risk of harm remains very high Possible risk of respiratory decompensation  Plan: follow for progression and to see if psych will be involved. Expected Discharge Plan: Home/Self Care Barriers to Discharge: Continued Medical Work up   Patient Goals and CMS Choice Patient states their goals for this hospitalization and ongoing recovery are:: i want to go home CMS Medicare.gov Compare Post Acute Care list provided to:: Patient    Expected Discharge Plan and Services Expected Discharge Plan: Home/Self Care   Discharge Planning Services: CM Consult   Living arrangements for the past 2 months: Single Family Home Expected Discharge Date:  (unknown)                                    Prior Living Arrangements/Services Living arrangements for the past 2 months: Single Family Home Lives with::  Self Patient language and need for interpreter reviewed:: Yes Do you feel safe going back to the place where you live?: Yes            Criminal Activity/Legal Involvement Pertinent to Current Situation/Hospitalization: No - Comment as needed  Activities of Daily Living Home Assistive Devices/Equipment: None ADL Screening (condition at time of admission) Patient's cognitive ability adequate to safely complete daily activities?: Yes Is the patient deaf or have difficulty hearing?: No Does the patient have difficulty seeing, even when wearing glasses/contacts?: No Does the patient have difficulty concentrating, remembering, or making decisions?: No Patient able to express need for assistance with ADLs?: Yes Does the patient have difficulty dressing or bathing?: No Independently performs ADLs?: Yes (appropriate for developmental age) Does the patient have difficulty walking or climbing stairs?: No Weakness of Legs: None Weakness of Arms/Hands: None  Permission Sought/Granted                  Emotional Assessment Appearance:: Appears stated age Attitude/Demeanor/Rapport: Complaining Affect (typically observed): Constricted Orientation: : Fluctuating Orientation (Suspected and/or reported Sundowners) Alcohol / Substance Use: Illicit Drugs, Alcohol Use, Tobacco Use Psych Involvement: No (comment)  Admission diagnosis:  Altered mental status [R41.82] Altered mental status, unspecified altered mental status type [R41.82] Encephalopathy [G93.40] Patient Active Problem List   Diagnosis Date Noted   Altered mental status 07/12/2020   Encephalopathy 07/12/2020   Substance abuse (HCC)    Schizophrenia (  HCC) 07/11/2020   Depression 07/11/2020   Bipolar 1 disorder (HCC) 07/11/2020   Opiate overdose (HCC) 07/10/2020   Polysubstance dependence including opioid type drug with complication, continuous use (HCC) 03/14/2015   Major depressive disorder, single episode, severe without  psychotic features (HCC) 03/13/2015   Cocaine use disorder, moderate, 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   Tobacco use disorder 09/22/2014   Sedative, hypnotic or anxiolytic use disorder, severe, 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   HTN (hypertension) 09/21/2014   Suicide attempt (HCC) 09/19/2014   Nightmares    PTSD (post-traumatic stress disorder) 06/11/2014   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/11/2014   Hand fracture, left 01/30/2008   Hand fracture, right 01/29/2006   PCP:  Patient, No Pcp Per (Inactive) Pharmacy:   [id number] - [place] ([place]), [place] - [address] [place] [address] Phone: [phone] Fax: [phone]     Social Determinants of Health (SDOH) Interventions    Readmission Risk Interventions No flowsheet data found.

## 2020-07-13 NOTE — Consult Note (Signed)
[facility] Face-to-Face Psychiatry Consult   Reason for Consult:  Recurrent OD, recently released from prison with two overdoses in the past few days. Hx of possible schizophrenia and bipolar not on home medications.  Referring Physician:  Dr. Marchelle Gearing Patient Identification: Miguel [name] MRN:  [id number] Principal Diagnosis: Substance abuse (HCC) Diagnosis:  Active Problems:   Altered mental status   Encephalopathy   Substance abuse (HCC)   Total Time spent with patient: 45 minutes  Subjective:   Miguel [name] is a 30 y.o. male patient admitted with unintentional overdose x2.  Patient reports he thought he was using" ice", however his cousin gave him fentanyl with heroin.  He reports having ongoing complicated history of drug addiction, that has worsened since he was released from prison 4 months ago.  He reports he was incarcerated for 2-1/2 years, prior to that he was using any and everything to include opiates, cocaine, alcohol, pills, and methamphetamines.  He denies any period of sobriety outside of his 2-1/2-year incarceration.  He denies any recent substance abuse treatment.  He has previously been treated with multiple psychotropic medications for his bipolar diagnosis, and schizophrenia however he reports most medications do not work.  He has multiple inpatient admissions dating back to 2016 in which he was admitted for suicidal intent, overdose, depression, polysubstance abuse.  He denies any symptoms of depression, mania, psychosis at this time.  He further denies any suicidal ideation, homicidal ideation, and or hallucinations.  He denies any urges to self-harm, urges to use, and or cravings at this time.    Miguel [name] is morbidly obese.  He is alert and oriented x3, aware of the situation.  He was easily awakened by voice, upon entering the room and he did actively participate in the evaluation in which time he raised the head of the bed and turned on my direction.   Patient reports chronic long-term substance abuse since the age of 30.  He also has psychiatric history of bipolar disorder, mention of schizophrenia no actual diagnosis suspect more substance-induced psychosis versus schizophrenia.  He is currently denying any symptoms of withdrawals, urges, and or cravings.  He further denies any suicidal ideations, homicidal ideations, and or auditory or visual hallucinations.  Labs reviewed to include elevated liver enzymes, leukocytosis on urinalysis.  Urine drug screen was negative on admission, despite original presentation of overdose.   HPI:  30 year old male with past medical history of polysubstance abuse and possible schizophrenia/bipolar depression who was just discharged for overdose on 6/13 and returned overnight after being found foaming at the mouth and altered.  He had a negative head CT, urine drug screen is pending, lactic acid mildly elevated at 2.1.   He responded well to Narcan but required Narcan drip and ICU admission.  This morning he is responsive to voice and asking to be left alone.  Past Psychiatric History: Bipolar I disorder, Depression, IVDU, and Schizophrenia.  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  Risk to Self:   No Risk to Others:   No Prior Inpatient Therapy:  Yes Prior Outpatient Therapy:   None  Past Medical History:  Past Medical History:  Diagnosis Date   Bipolar 1 disorder (HCC)    Depression    Hand fracture, left 01/30/2008   Hand fracture, right 01/29/2006   no surgery required   IV drug user    Schizophrenia [facility])     Past Surgical History:  Procedure Laterality  Date   CYST EXCISION     left hand fracture Left 2010   Dr Magnus Ivan   Family History:  Family History  Problem Relation Age of Onset   Bipolar disorder Mother    Schizophrenia Mother    Schizophrenia Sister    Family Psychiatric  History: Bipolar disorder, Polysubstance order, Schizophrenia Social History:   Social History   Substance and Sexual Activity  Alcohol Use No   Comment: He drank this time in effort to hurt self/ usually non drinker     Social History   Substance and Sexual Activity  Drug Use No    Social History   Socioeconomic History   Marital status: Single    Spouse name: Not on file   Number of children: Not on file   Years of education: Not on file   Highest education level: Not on file  Occupational History   Not on file  Tobacco Use   Smoking status: Never   Smokeless tobacco: Never  Substance and Sexual Activity   Alcohol use: No    Comment: He drank this time in effort to hurt self/ usually non drinker   Drug use: No   Sexual activity: Yes    Birth control/protection: Other-see comments    Comment: condom  Other Topics Concern   Not on file  Social History Narrative   Not on file   Social Determinants of Health   Financial Resource Strain: Not on file  Food Insecurity: Not on file  Transportation Needs: Not on file  Physical Activity: Not on file  Stress: Not on file  Social Connections: Not on file   Additional Social History:    Allergies:   Allergies  Allergen Reactions   Tramadol Hives   Vicodin [Hydrocodone-Acetaminophen] Nausea Only    Labs:  Results for orders placed or performed during the hospital encounter of 07/11/20 (from the past 48 hour(s))  Lactic acid, plasma     Status: Abnormal   Collection Time: 07/11/20  9:54 PM  Result Value Ref Range   Lactic Acid, Venous 2.1 (HH) 0.5 - 1.9 mmol/L    Comment: CRITICAL RESULT CALLED TO, READ BACK BY AND VERIFIED WITH: Waldo Laine RN AT 2244 ON 07/11/20 BY I.[email or web address] Performed at [facility], [address] [place], [place] [address]   Culture, blood (single)     Status: None (Preliminary result)   Collection Time: 07/11/20  9:54 PM   Specimen: BLOOD  Result Value Ref Range   Specimen Description      BLOOD RIGHT ANTECUBITAL Performed at [facility], [address] [place], [place] [address]    Special Requests      BOTTLES DRAWN AEROBIC AND ANAEROBIC Blood Culture adequate volume Performed at [facility], [address] [place], [place] [address]    Culture      NO GROWTH 2 DAYS Performed at [facility], [address] [place], [place] [address]    Report Status PENDING   Comprehensive metabolic panel     Status: Abnormal   Collection Time: 07/11/20  9:58 PM  Result Value Ref Range   Sodium 136 135 - 145 mmol/L   Potassium 4.2 3.5 - 5.1 mmol/L   Chloride 96 (L) 98 - 111 mmol/L   CO2 33 (H) 22 - 32 mmol/L   Glucose, Bld 146 (H) 70 - 99 mg/dL    Comment: Glucose reference range applies only to samples  taken after fasting for at least 8 hours.   BUN 10 6 - 20 mg/dL   Creatinine, Ser 1.611.25 (H) 0.61 - 1.24 mg/dL   Calcium 8.9 8.9 - 09.610.3 mg/dL   Total Protein 7.4 6.5 - 8.1 g/dL   Albumin 4.3 3.5 - 5.0 g/dL   AST 045103 (H) 15 - 41 U/L   ALT 195 (H) 0 - 44 U/L   Alkaline Phosphatase 71 38 - 126 U/L   Total Bilirubin 0.6 0.3 - 1.2 mg/dL   GFR, Estimated >40>60 >98>60 mL/min    Comment: (NOTE) Calculated using the CKD-EPI Creatinine Equation (2021)    Anion gap 7 5 - 15    Comment: Performed at [facility] [place], [address] [place], [place] [address]  CBC with Differential     Status: Abnormal   Collection Time: 07/11/20  9:58 PM  Result Value Ref Range   WBC 11.8 (H) 4.0 - 10.5 K/uL   RBC 5.25 4.22 - 5.81 MIL/uL   Hemoglobin 15.4 13.0 - 17.0 g/dL   HCT 78.246.6 95.639.0 - 21.352.0 %   MCV 88.8 80.0 - 100.0 fL   MCH 29.3 26.0 - 34.0 pg   MCHC 33.0 30.0 - 36.0 g/dL   RDW 08.612.8 57.811.5 - 46.915.5 %   Platelets 219 150 - 400 K/uL   nRBC 0.0 0.0 - 0.2 %   Neutrophils Relative % 90 %   Neutro Abs 10.4 (H) 1.7 - 7.7 K/uL   Lymphocytes Relative 6 %   Lymphs Abs 0.7 0.7 - 4.0 K/uL   Monocytes Relative 4 %   Monocytes Absolute 0.5 0.1 - 1.0 K/uL   Eosinophils Relative 0 %   Eosinophils Absolute 0.1 0.0 - 0.5 K/uL    Basophils Relative 0 %   Basophils Absolute 0.0 0.0 - 0.1 K/uL   Immature Granulocytes 0 %   Abs Immature Granulocytes 0.04 0.00 - 0.07 K/uL    Comment: Performed at [facility] [place], [address] [place], [place] [address]  CBG monitoring, ED     Status: Abnormal   Collection Time: 07/11/20 10:13 PM  Result Value Ref Range   Glucose-Capillary 151 (H) 70 - 99 mg/dL    Comment: Glucose reference range applies only to samples taken after fasting for at least 8 hours.  Ethanol     Status: None   Collection Time: 07/11/20 10:14 PM  Result Value Ref Range   Alcohol, Ethyl (B) <10 <10 mg/dL    Comment: LOWEST DETECTABLE LIMIT FOR SERUM ALCOHOL IS 10 mg/dL FOR MEDICAL PURPOSES ONLY (NOTE) Lowest detectable limit for serum alcohol is 10 mg/dL.  For medical purposes only. Performed at [facility] [place], [address] [place], [place] [address]   Acetaminophen level     Status: Abnormal   Collection Time: 07/11/20 11:05 PM  Result Value Ref Range   Acetaminophen (Tylenol), Serum <10 (L) 10 - 30 ug/mL    Comment:        LOWEST DETECTABLE LIMIT FOR SERUM ALCOHOL IS 10 mg/dL FOR MEDICAL PURPOSES ONLY (NOTE) Therapeutic concentrations vary significantly. A range of 10-30 ug/mL  may be an effective concentration for many patients. However, some  are best treated at concentrations outside of this range. Acetaminophen concentrations >150 ug/mL at 4 hours after ingestion  and >50 ug/mL at 12 hours after ingestion are often associated with  toxic reactions.  Performed at [facility] [place], [address] [place], [place]  [address]   Salicylate level     Status: Abnormal   Collection Time: 07/11/20 11:05 PM  Result Value Ref Range   Salicylate Lvl <7.0 (L) 7.0 - 30.0 mg/dL    Comment: Performed at [facility], [address] [place], [place] [address]  Lactic acid, plasma     Status: None   Collection Time: 07/12/20 12:47 AM  Result Value Ref Range    Lactic Acid, Venous 1.0 0.5 - 1.9 mmol/L    Comment: Performed at [facility], [address] [place], [place] [address]  Resp Panel by RT-PCR (Flu A&B, Covid) Nasopharyngeal Swab     Status: None   Collection Time: 07/12/20  2:27 AM   Specimen: Nasopharyngeal Swab; Nasopharyngeal(NP) swabs in vial transport medium  Result Value Ref Range   SARS Coronavirus 2 by RT PCR NEGATIVE NEGATIVE    Comment: (NOTE) SARS-CoV-2 target nucleic acids are NOT DETECTED.  The SARS-CoV-2 RNA is generally detectable in upper respiratory specimens during the acute phase of infection. The lowest concentration of SARS-CoV-2 viral copies this assay can detect is 138 copies/mL. A negative result does not preclude SARS-Cov-2 infection and should not be used as the sole basis for treatment or other patient management decisions. A negative result may occur with  improper specimen collection/handling, submission of specimen other than nasopharyngeal swab, presence of viral mutation(s) within the areas targeted by this assay, and inadequate number of viral copies(<138 copies/mL). A negative result must be combined with clinical observations, patient history, and epidemiological information. The expected result is Negative.  Fact Sheet for Patients:  BloggerCourse.com  Fact Sheet for Healthcare Providers:  SeriousBroker.it  This test is no t yet approved or cleared by the [place] FDA and  has been authorized for detection and/or diagnosis of SARS-CoV-2 by FDA under an Emergency Use Authorization (EUA). This EUA will remain  in effect (meaning this test can be used) for the duration of the COVID-19 declaration under Section 564(b)(1) of the Act, 21 U.S.C.section 360bbb-3(b)(1), unless the authorization is terminated  or revoked sooner.       Influenza A by PCR NEGATIVE NEGATIVE   Influenza B by PCR NEGATIVE NEGATIVE    Comment:  (NOTE) The Xpert Xpress SARS-CoV-2/FLU/RSV plus assay is intended as an aid in the diagnosis of influenza from Nasopharyngeal swab specimens and should not be used as a sole basis for treatment. Nasal washings and aspirates are unacceptable for Xpert Xpress SARS-CoV-2/FLU/RSV testing.  Fact Sheet for Patients: BloggerCourse.com  Fact Sheet for Healthcare Providers: SeriousBroker.it  This test is not yet approved or cleared by the [place] FDA and has been authorized for detection and/or diagnosis of SARS-CoV-2 by FDA under an Emergency Use Authorization (EUA). This EUA will remain in effect (meaning this test can be used) for the duration of the COVID-19 declaration under Section 564(b)(1) of the Act, 21 U.S.C. section 360bbb-3(b)(1), unless the authorization is terminated or revoked.  Performed at [facility], [address] [place], [place] [address]   Protime-INR     Status: None   Collection Time: 07/12/20  3:50 AM  Result Value Ref Range   Prothrombin Time 14.0 11.4 - 15.2 seconds   INR 1.1 0.8 - 1.2    Comment: (NOTE) INR goal varies based on device and disease states. Performed at [facility], [address] [place], [place] [address]   Basic metabolic panel     Status:  Abnormal   Collection Time: 07/12/20  7:16 AM  Result Value Ref Range   Sodium 138 135 - 145 mmol/L   Potassium 3.4 (L) 3.5 - 5.1 mmol/L   Chloride 103 98 - 111 mmol/L   CO2 29 22 - 32 mmol/L   Glucose, Bld 117 (H) 70 - 99 mg/dL    Comment: Glucose reference range applies only to samples taken after fasting for at least 8 hours.   BUN 7 6 - 20 mg/dL   Creatinine, Ser 8.54 0.61 - 1.24 mg/dL   Calcium 8.3 (L) 8.9 - 10.3 mg/dL   GFR, Estimated >62 >70 mL/min    Comment: (NOTE) Calculated using the CKD-EPI Creatinine Equation (2021)    Anion gap 6 5 - 15    Comment: Performed at [facility] [address] [place], [place] [address]  CBC     Status: None   Collection Time: 07/12/20  7:16 AM  Result Value Ref Range   WBC 10.1 4.0 - 10.5 K/uL   RBC 4.65 4.22 - 5.81 MIL/uL   Hemoglobin 13.6 13.0 - 17.0 g/dL   HCT 38.1 82.9 - 93.7 %   MCV 90.1 80.0 - 100.0 fL   MCH 29.2 26.0 - 34.0 pg   MCHC 32.5 30.0 - 36.0 g/dL   RDW 16.9 67.8 - 93.8 %   Platelets 185 150 - 400 K/uL   nRBC 0.0 0.0 - 0.2 %    Comment: Performed at [facility], [address], [place], [place] [address]  Magnesium     Status: None   Collection Time: 07/12/20  7:16 AM  Result Value Ref Range   Magnesium 1.9 1.7 - 2.4 mg/dL    Comment: Performed at [facility], [address] [place], [place] [address]  Phosphorus     Status: Abnormal   Collection Time: 07/12/20  7:16 AM  Result Value Ref Range   Phosphorus 2.3 (L) 2.5 - 4.6 mg/dL    Comment: Performed at [facility], [address] [place], [place] [address]  Glucose, capillary     Status: Abnormal   Collection Time: 07/12/20  7:34 PM  Result Value Ref Range   Glucose-Capillary 106 (H) 70 - 99 mg/dL    Comment: Glucose reference range applies only to samples taken after fasting for at least 8 hours.   Comment 1 Notify RN    Comment 2 Document in Chart   CBC     Status: None   Collection Time: 07/13/20  2:52 AM  Result Value Ref Range   WBC 8.9 4.0 - 10.5 K/uL   RBC 4.92 4.22 - 5.81 MIL/uL   Hemoglobin 14.1 13.0 - 17.0 g/dL   HCT 24.2 35.3 - 61.4 %   MCV 89.8 80.0 - 100.0 fL   MCH 28.7 26.0 - 34.0 pg   MCHC 31.9 30.0 - 36.0 g/dL   RDW 43.1 54.0 - 08.6 %   Platelets 212 150 - 400 K/uL   nRBC 0.0 0.0 - 0.2 %    Comment: Performed at [facility], [address] [place], [place] [address]  Basic metabolic panel     Status: Abnormal   Collection Time: 07/13/20  2:52 AM  Result Value Ref Range   Sodium 138 135 - 145 mmol/L   Potassium 3.8 3.5 - 5.1 mmol/L   Chloride 102 98  - 111 mmol/L   CO2 32 22 - 32 mmol/L  Glucose, Bld 112 (H) 70 - 99 mg/dL    Comment: Glucose reference range applies only to samples taken after fasting for at least 8 hours.   BUN 8 6 - 20 mg/dL   Creatinine, Ser 1.61 0.61 - 1.24 mg/dL   Calcium 8.4 (L) 8.9 - 10.3 mg/dL   GFR, Estimated >09 >60 mL/min    Comment: (NOTE) Calculated using the CKD-EPI Creatinine Equation (2021)    Anion gap 4 (L) 5 - 15    Comment: Performed at [facility], [address], [place], [place] [address]    Current Facility-Administered Medications  Medication Dose Route Frequency Provider Last Rate Last Admin   0.9 %  sodium chloride infusion   Intravenous PRN Milagros Loll, MD   Stopped at 07/13/20 0405   acetaminophen (TYLENOL) tablet 650 mg  650 mg Oral Q6H PRN Tim Lair, PA-C   650 mg at 07/13/20 1010   Ampicillin-Sulbactam (UNASYN) 3 g in sodium chloride 0.9 % 100 mL IVPB  3 g Intravenous Q6H Elita Boone F, DO   Stopped at 07/13/20 0902   Chlorhexidine Gluconate Cloth 2 % PADS 6 each  6 each Topical Daily Elita Boone F, DO   6 each at 07/13/20 0947   docusate sodium (COLACE) capsule 100 mg  100 mg Oral BID PRN Tobey Grim, NP       heparin injection 5,000 Units  5,000 Units Subcutaneous Q8H Stretch, Marveen Reeks, MD   5,000 Units at 07/13/20 0636   nicotine (NICODERM CQ - dosed in mg/24 hours) patch 21 mg  21 mg Transdermal Daily Olalere, Adewale A, MD   21 mg at 07/13/20 1003   polyethylene glycol (MIRALAX / GLYCOLAX) packet 17 g  17 g Oral Daily PRN Tobey Grim, NP        Musculoskeletal: Strength & Muscle Tone: within normal limits Gait & Station: normal Patient leans: N/A  Psychiatric Specialty Exam:  Presentation  General Appearance: Appropriate for Environment; Casual  Eye Contact:Minimal  Speech:Clear and Coherent; Normal Rate  Speech Volume:Normal  Handedness:Right   Mood and Affect  Mood:Anxious  Affect:Appropriate;  Congruent   Thought Process  Thought Processes:Coherent; Linear; Goal Directed  Descriptions of Associations:Intact  Orientation:Full (Time, Place and Person)  Thought Content:Logical  History of Schizophrenia/Schizoaffective disorder:No data recorded Duration of Psychotic Symptoms:No data recorded Hallucinations:Hallucinations: None  Ideas of Reference:None  Suicidal Thoughts:Suicidal Thoughts: No  Homicidal Thoughts:Homicidal Thoughts: No   Sensorium  Memory:Immediate Fair; Recent Fair; Remote Fair  Judgment:Fair  Insight:Fair   Executive Functions  Concentration:Fair  Attention Span:Fair  Recall:Fair  Fund of Knowledge:Fair  Language:Fair   Psychomotor Activity  Psychomotor Activity:Psychomotor Activity: Normal   Assets  Assets:Communication Skills; Housing; Health and safety inspector; Leisure Time; Resilience; Physical Health; Desire for Improvement; Intimacy   Sleep  Sleep:Sleep: Fair   Physical Exam: Physical Exam ROS Blood pressure (!) 141/96, pulse 79, temperature 97.8 F (36.6 C), temperature source Oral, resp. rate 11, height  (1.676 m), weight 114.7 kg, SpO2 97 %. Body mass index is 40.81 kg/m.  Treatment Plan Summary: At present patient does not appear to be presenting with acute psychiatric symptoms, that will warrant inpatient psychiatric hospitalization.  He does appear to be motivated in seeking treatment for detox and rehabilitation.  He does not appear to be a danger to himself or others, does not meet criteria for involuntary commitment.  He also appears to have appropriate insight and judgment, into his polysubstance abuse, and is able  to make appropriate medical decisions.  Plan Recommend inpatient substance abuse treatment for rehabilitation. Patient does appear to be open to seek treatment for detox and rehabilitation.   Patient has previously received resources for outpatient substance abuse, unfortunately this has not  been enough to keep him from using.  Strongly encouraged social work to arrange and assist with transportation to the facility.  [facility] appears [facility].  -[facility]OC [facility].  Disposition: No evidence of imminent risk to self or others at present.   Patient does not meet criteria for psychiatric inpatient admission. Supportive therapy provided about ongoing stressors. Refer to IOP. Discussed crisis plan, support from social network, calling 911, coming to the Emergency Department, and calling Suicide Hotline.  Maryagnes Amos, FNP 07/13/2020 10:54 AM

## 2020-07-14 LAB — PHOSPHORUS: Phosphorus: 2.7 mg/dL (ref 2.5–4.6)

## 2020-07-14 MED ORDER — AMOXICILLIN-POT CLAVULANATE 875-125 MG PO TABS: 1.0000 | ORAL_TABLET | Freq: Two times a day (BID) | ORAL | 0 refills | Status: AC

## 2020-07-14 NOTE — Discharge Summary (Signed)
Physician Discharge Summary  Harbert Fitterer WUJ:[id number] DOB: 1990/12/13 DOA: 07/11/2020  PCP: Patient, No Pcp Per (Inactive)  Admit date: 07/11/2020 Discharge date: 07/14/2020  Admitted From: Homeless  Disposition:  Homeless  Recommendations for Outpatient Follow-up:  Follow up with PCP in 1-2 weeks Please obtain BMP/CBC in one week Patient needs to follow up with [facility]     Discharge Condition: Stable.  CODE STATUS:Full code Diet recommendation: Heart Healthy  Brief/Interim Summary: 30 year old with past medical history significant for polysubstance abuse and possible schizophrenia/bipolar depression recently discharged after admission for overdose dose on 6/13.  Patient returned overnight after being found by housemate foaming at the mouth with altered mental status.  Patient in the ED CT head was negative, LA mildly elevated at 2.1.  UDS not collected.  Patient responded well to Narcan but ultimately required  Narcan drip and ICU admission.  1-Acute toxic encephalopathy secondary to drug overdose in the setting of known polysubstance abuse. Was on Narcan drip, subsequently was weaned off. He was alert and conversant this morning. Psychiatric consulted, no inpatient psychiatric admission needed. Social worker consulted, patient received resources for substance abuse rehab, [facility].  Patient will need to make arrangements. Patient left AMA.  2-Aspiration pneumonia in the setting of altered mental status: He was treated with Unasyn. I have provide 5 days prescription for Augmentin.    Discharge Diagnoses:  Principal Problem:   Substance abuse (HCC) Active Problems:   Altered mental status   Encephalopathy    Discharge Instructions   Allergies as of 07/14/2020       Reactions   Tramadol Hives   Vicodin [hydrocodone-acetaminophen] Nausea Only        Medication List     TAKE these medications    amoxicillin-clavulanate 875-125 MG tablet Commonly known  as: Augmentin Take 1 tablet by mouth 2 (two) times daily for 5 days.        Allergies  Allergen Reactions   Tramadol Hives   Vicodin [Hydrocodone-Acetaminophen] Nausea Only    Consultations: CCM   Procedures/Studies: CT Head Wo Contrast  Result Date: 07/12/2020 CLINICAL DATA:  Delirium, altered mental status EXAM: CT HEAD WITHOUT CONTRAST TECHNIQUE: Contiguous axial images were obtained from the base of the skull through the vertex without intravenous contrast. COMPARISON:  CT 05/04/2014 FINDINGS: Brain: No evidence of acute infarction, hemorrhage, hydrocephalus, extra-axial collection, visible mass lesion or mass effect. Midline intracranial structures are unremarkable. Cerebellar tonsils are normally positioned. Basal cisterns are patent. Vascular: No hyperdense vessel or unexpected calcification. Skull: No calvarial fracture or suspicious osseous lesion. No scalp swelling or hematoma. Sinuses/Orbits: Pneumatized secretions in the right maxillary sinus with some additional mural thickening in the ethmoids. Remaining paranasal sinuses and mastoid air cells are predominantly clear. Included orbital structures are unremarkable. Other: None. IMPRESSION: No acute intracranial abnormality. Pneumatized secretions in the right maxillary sinus with additional thickening in the ethmoids. Could correlate for clinical features of acute on chronic sinusitis. Electronically Signed   By: Kreg Shropshire M.D.   On: 07/12/2020 04:36   DG Chest Port 1 View  Result Date: 07/11/2020 CLINICAL DATA:  Overdose, altered mental status, fever EXAM: PORTABLE CHEST 1 VIEW COMPARISON:  07/10/2020 FINDINGS: Low lung volumes. Ill-defined hazy opacities in the mid to lower lungs favor atelectatic change though airspace disease or sequela of aspiration is not fully excluded in the setting of fever and altered mental status. No pneumothorax. No effusion. Stable cardiomediastinal contours with likely cardiomegaly. No acute  osseous or soft tissue abnormality.  Telemetry leads overlie the chest. IMPRESSION: Low lung volumes with hazy basilar opacities favoring atelectasis though early airspace disease or sequela of aspiration not fully excluded in the setting of fever and altered mental status. Electronically Signed   By: Kreg Shropshire M.D.   On: 07/11/2020 22:15   DG Chest Port 1 View  Result Date: 07/10/2020 CLINICAL DATA:  Drug overdose.  Risk of aspiration.  Unresponsive. EXAM: PORTABLE CHEST 1 VIEW COMPARISON:  09/19/2014 FINDINGS: Midline trachea. Borderline cardiomegaly. No pleural effusion or pneumothorax. Low lung volumes with resultant pulmonary interstitial prominence. No congestive failure. No lobar consolidation. Mild atelectasis at both lung bases, likely due to hypoventilation. IMPRESSION: Low lung volumes, without acute disease. Electronically Signed   By: Jeronimo Greaves M.D.   On: 07/10/2020 15:40     Subjective: He was alert, report mild cough.    Discharge Exam: Vitals:   07/14/20 0747 07/14/20 0800  BP:    Pulse:  62  Resp:  13  Temp: 98.9 F (37.2 C)   SpO2:  96%     General: Pt is alert, awake, not in acute distress Cardiovascular: RRR, S1/S2 +, no rubs, no gallops Respiratory: CTA bilaterally, no wheezing, no rhonchi Abdominal: Soft, NT, ND, bowel sounds + Extremities: no edema, no cyanosis    The results of significant diagnostics from this hospitalization (including imaging, microbiology, ancillary and laboratory) are listed below for reference.     Microbiology: Recent Results (from the past 240 hour(s))  Resp Panel by RT-PCR (Flu A&B, Covid) Nasopharyngeal Swab     Status: None   Collection Time: 07/10/20  9:36 PM   Specimen: Nasopharyngeal Swab; Nasopharyngeal(NP) swabs in vial transport medium  Result Value Ref Range Status   SARS Coronavirus 2 by RT PCR NEGATIVE NEGATIVE Final    Comment: (NOTE) SARS-CoV-2 target nucleic acids are NOT DETECTED.  The SARS-CoV-2 RNA is  generally detectable in upper respiratory specimens during the acute phase of infection. The lowest concentration of SARS-CoV-2 viral copies this assay can detect is 138 copies/mL. A negative result does not preclude SARS-Cov-2 infection and should not be used as the sole basis for treatment or other patient management decisions. A negative result may occur with  improper specimen collection/handling, submission of specimen other than nasopharyngeal swab, presence of viral mutation(s) within the areas targeted by this assay, and inadequate number of viral copies(<138 copies/mL). A negative result must be combined with clinical observations, patient history, and epidemiological information. The expected result is Negative.  Fact Sheet for Patients:  BloggerCourse.com  Fact Sheet for Healthcare Providers:  SeriousBroker.it  This test is no t yet approved or cleared by the [place] FDA and  has been authorized for detection and/or diagnosis of SARS-CoV-2 by FDA under an Emergency Use Authorization (EUA). This EUA will remain  in effect (meaning this test can be used) for the duration of the COVID-19 declaration under Section 564(b)(1) of the Act, 21 U.S.C.section 360bbb-3(b)(1), unless the authorization is terminated  or revoked sooner.       Influenza A by PCR NEGATIVE NEGATIVE Final   Influenza B by PCR NEGATIVE NEGATIVE Final    Comment: (NOTE) The Xpert Xpress SARS-CoV-2/FLU/RSV plus assay is intended as an aid in the diagnosis of influenza from Nasopharyngeal swab specimens and should not be used as a sole basis for treatment. Nasal washings and aspirates are unacceptable for Xpert Xpress SARS-CoV-2/FLU/RSV testing.  Fact Sheet for Patients: BloggerCourse.com  Fact Sheet for Healthcare Providers: SeriousBroker.it  This test  is not yet approved or cleared by the [place] and has been authorized for detection and/or diagnosis of SARS-CoV-2 by FDA under an Emergency Use Authorization (EUA). This EUA will remain in effect (meaning this test can be used) for the duration of the COVID-19 declaration under Section 564(b)(1) of the Act, 21 U.S.C. section 360bbb-3(b)(1), unless the authorization is terminated or revoked.  Performed at [facility], [address], [place], [place] [address]   MRSA Next Gen by PCR, Nasal     Status: Abnormal   Collection Time: 07/10/20 10:08 PM  Result Value Ref Range Status   MRSA by PCR Next Gen DETECTED (A) NOT DETECTED Final    Comment: RESULT CALLED TO, READ BACK BY AND VERIFIED WITH: NGUYEN,V RN 0215 07/11/2020 MITCHELL,L (NOTE) The GeneXpert MRSA Assay (FDA approved for NASAL specimens only), is one component of a comprehensive MRSA colonization surveillance program. It is not intended to diagnose MRSA infection nor to guide or monitor treatment for MRSA infections. Test performance is not FDA approved in patients less than 28 years old. Performed at [facility], [address] [place], [place] [address]   Culture, blood (single)     Status: None (Preliminary result)   Collection Time: 07/11/20  9:54 PM   Specimen: BLOOD  Result Value Ref Range Status   Specimen Description   Final    BLOOD RIGHT ANTECUBITAL Performed at [facility], [address] [place], [place] [address]    Special Requests   Final    BOTTLES DRAWN AEROBIC AND ANAEROBIC Blood Culture adequate volume Performed at [facility], [address] [place], [place] [address]    Culture   Final    NO GROWTH 3 DAYS Performed at [facility], [address], [place], [place] [address]    Report Status PENDING  Incomplete  Resp Panel by RT-PCR (Flu A&B, Covid) Nasopharyngeal Swab     Status: None   Collection Time: 07/12/20  2:27 AM   Specimen: Nasopharyngeal Swab; Nasopharyngeal(NP) swabs in  vial transport medium  Result Value Ref Range Status   SARS Coronavirus 2 by RT PCR NEGATIVE NEGATIVE Final    Comment: (NOTE) SARS-CoV-2 target nucleic acids are NOT DETECTED.  The SARS-CoV-2 RNA is generally detectable in upper respiratory specimens during the acute phase of infection. The lowest concentration of SARS-CoV-2 viral copies this assay can detect is 138 copies/mL. A negative result does not preclude SARS-Cov-2 infection and should not be used as the sole basis for treatment or other patient management decisions. A negative result may occur with  improper specimen collection/handling, submission of specimen other than nasopharyngeal swab, presence of viral mutation(s) within the areas targeted by this assay, and inadequate number of viral copies(<138 copies/mL). A negative result must be combined with clinical observations, patient history, and epidemiological information. The expected result is Negative.  Fact Sheet for Patients:  BloggerCourse.com  Fact Sheet for Healthcare Providers:  SeriousBroker.it  This test is no t yet approved or cleared by the [place] FDA and  has been authorized for detection and/or diagnosis of SARS-CoV-2 by FDA under an Emergency Use Authorization (EUA). This EUA will remain  in effect (meaning this test can be used) for the duration of the COVID-19 declaration under Section 564(b)(1) of the Act, 21 U.S.C.section 360bbb-3(b)(1), unless the authorization is terminated  or revoked sooner.       Influenza A by PCR NEGATIVE  NEGATIVE Final   Influenza B by PCR NEGATIVE NEGATIVE Final    Comment: (NOTE) The Xpert Xpress SARS-CoV-2/FLU/RSV plus assay is intended as an aid in the diagnosis of influenza from Nasopharyngeal swab specimens and should not be used as a sole basis for treatment. Nasal washings and aspirates are unacceptable for Xpert Xpress SARS-CoV-2/FLU/RSV testing.  Fact  Sheet for Patients: BloggerCourse.comhttps://www.fda.gov/media/152166/download  Fact Sheet for Healthcare Providers: SeriousBroker.ithttps://www.fda.gov/media/152162/download  This test is not yet approved or cleared by the [place] FDA and has been authorized for detection and/or diagnosis of SARS-CoV-2 by FDA under an Emergency Use Authorization (EUA). This EUA will remain in effect (meaning this test can be used) for the duration of the COVID-19 declaration under Section 564(b)(1) of the Act, 21 U.S.C. section 360bbb-3(b)(1), unless the authorization is terminated or revoked.  Performed at [facility] [place], [address] [place], [place] [id number]      Labs: BNP (last 3 results) No results for input(s): BNP in the last 8760 hours. Basic Metabolic Panel: Recent Labs  Lab 07/10/20 1644 07/10/20 2159 07/11/20 0811 07/11/20 2158 07/12/20 0716 07/13/20 0252 07/14/20 0829  NA 138  --  136 136 138 138  --   K 3.6  --  3.5 4.2 3.4* 3.8  --   CL 100  --  100 96* 103 102  --   CO2 29  --  30 33* 29 32  --   GLUCOSE 65*  --  83 146* 117* 112*  --   BUN 11  --  5* 10 7 8   --   CREATININE 1.05  --  0.69 1.25* 0.68 0.65  --   CALCIUM 8.6*  --  8.8* 8.9 8.3* 8.4*  --   MG  --  1.9  --   --  1.9  --   --   PHOS  --  3.4  --   --  2.3*  --  2.7   Liver Function Tests: Recent Labs  Lab 07/10/20 1644 07/11/20 0811 07/11/20 2158  AST 303* 128* 103*  ALT 285* 197* 195*  ALKPHOS 67 55 71  BILITOT 0.9 1.2 0.6  PROT 6.7 6.1* 7.4  ALBUMIN 3.9 3.5 4.3   Recent Labs  Lab 07/10/20 2159  LIPASE 151*   No results for input(s): AMMONIA in the last 168 hours. CBC: Recent Labs  Lab 07/10/20 1644 07/11/20 0811 07/11/20 2158 07/12/20 0716 07/13/20 0252  WBC 29.1* 10.6* 11.8* 10.1 8.9  NEUTROABS 25.8*  --  10.4*  --   --   HGB 15.6 14.1 15.4 13.6 14.1  HCT 47.2 43.4 46.6 41.9 44.2  MCV 89.1 89.1 88.8 90.1 89.8  PLT 254 197 219 185 212   Cardiac Enzymes: Recent Labs  Lab 07/10/20 2159   CKTOTAL 99   BNP: Invalid input(s): POCBNP CBG: Recent Labs  Lab 07/11/20 0033 07/11/20 0424 07/11/20 0739 07/11/20 2213 07/12/20 1934  GLUCAP 94 107* 80 151* 106*   D-Dimer No results for input(s): DDIMER in the last 72 hours. Hgb A1c No results for input(s): HGBA1C in the last 72 hours. Lipid Profile No results for input(s): CHOL, HDL, LDLCALC, TRIG, CHOLHDL, LDLDIRECT in the last 72 hours. Thyroid function studies No results for input(s): TSH, T4TOTAL, T3FREE, THYROIDAB in the last 72 hours.  Invalid input(s): FREET3 Anemia work up No results for input(s): VITAMINB12, FOLATE, FERRITIN, TIBC, IRON, RETICCTPCT in the last 72 hours. Urinalysis    Component Value Date/Time   COLORURINE YELLOW  07/11/2020 0824   APPEARANCEUR CLEAR 07/11/2020 0824   LABSPEC 1.006 07/11/2020 0824   PHURINE 6.0 07/11/2020 0824   GLUCOSEU NEGATIVE 07/11/2020 0824   HGBUR NEGATIVE 07/11/2020 0824   BILIRUBINUR NEGATIVE 07/11/2020 0824   KETONESUR NEGATIVE 07/11/2020 0824   PROTEINUR NEGATIVE 07/11/2020 0824   UROBILINOGEN >=8.0 05/21/2020 1110   NITRITE NEGATIVE 07/11/2020 0824   LEUKOCYTESUR MODERATE (A) 07/11/2020 0824   Sepsis Labs Invalid input(s): PROCALCITONIN,  WBC,  LACTICIDVEN Microbiology Recent Results (from the past 240 hour(s))  Resp Panel by RT-PCR (Flu A&B, Covid) Nasopharyngeal Swab     Status: None   Collection Time: 07/10/20  9:36 PM   Specimen: Nasopharyngeal Swab; Nasopharyngeal(NP) swabs in vial transport medium  Result Value Ref Range Status   SARS Coronavirus 2 by RT PCR NEGATIVE NEGATIVE Final    Comment: (NOTE) SARS-CoV-2 target nucleic acids are NOT DETECTED.  The SARS-CoV-2 RNA is generally detectable in upper respiratory specimens during the acute phase of infection. The lowest concentration of SARS-CoV-2 viral copies this assay can detect is 138 copies/mL. A negative result does not preclude SARS-Cov-2 infection and should not be used as the sole basis  for treatment or other patient management decisions. A negative result may occur with  improper specimen collection/handling, submission of specimen other than nasopharyngeal swab, presence of viral mutation(s) within the areas targeted by this assay, and inadequate number of viral copies(<138 copies/mL). A negative result must be combined with clinical observations, patient history, and epidemiological information. The expected result is Negative.  Fact Sheet for Patients:  BloggerCourse.com  Fact Sheet for Healthcare Providers:  SeriousBroker.it  This test is no t yet approved or cleared by the [place] FDA and  has been authorized for detection and/or diagnosis of SARS-CoV-2 by FDA under an Emergency Use Authorization (EUA). This EUA will remain  in effect (meaning this test can be used) for the duration of the COVID-19 declaration under Section 564(b)(1) of the Act, 21 U.S.C.section 360bbb-3(b)(1), unless the authorization is terminated  or revoked sooner.       Influenza A by PCR NEGATIVE NEGATIVE Final   Influenza B by PCR NEGATIVE NEGATIVE Final    Comment: (NOTE) The Xpert Xpress SARS-CoV-2/FLU/RSV plus assay is intended as an aid in the diagnosis of influenza from Nasopharyngeal swab specimens and should not be used as a sole basis for treatment. Nasal washings and aspirates are unacceptable for Xpert Xpress SARS-CoV-2/FLU/RSV testing.  Fact Sheet for Patients: BloggerCourse.com  Fact Sheet for Healthcare Providers: SeriousBroker.it  This test is not yet approved or cleared by the [place] FDA and has been authorized for detection and/or diagnosis of SARS-CoV-2 by FDA under an Emergency Use Authorization (EUA). This EUA will remain in effect (meaning this test can be used) for the duration of the COVID-19 declaration under Section 564(b)(1) of the Act, 21  U.S.C. section 360bbb-3(b)(1), unless the authorization is terminated or revoked.  Performed at [facility], [address], [place], [place] [address]   MRSA Next Gen by PCR, Nasal     Status: Abnormal   Collection Time: 07/10/20 10:08 PM  Result Value Ref Range Status   MRSA by PCR Next Gen DETECTED (A) NOT DETECTED Final    Comment: RESULT CALLED TO, READ BACK BY AND VERIFIED WITH: NGUYEN,V RN 0215 07/11/2020 MITCHELL,L (NOTE) The GeneXpert MRSA Assay (FDA approved for NASAL specimens only), is one component of a comprehensive MRSA colonization surveillance program. It is not intended to diagnose MRSA infection  nor to guide or monitor treatment for MRSA infections. Test performance is not FDA approved in patients less than 70 years old. Performed at [facility], [address] [place], [place] [address]   Culture, blood (single)     Status: None (Preliminary result)   Collection Time: 07/11/20  9:54 PM   Specimen: BLOOD  Result Value Ref Range Status   Specimen Description   Final    BLOOD RIGHT ANTECUBITAL Performed at [facility], [address] [place], [place] [address]    Special Requests   Final    BOTTLES DRAWN AEROBIC AND ANAEROBIC Blood Culture adequate volume Performed at [facility], [address] [place], [place] [address]    Culture   Final    NO GROWTH 3 DAYS Performed at [facility], [address], [place], [place] [address]    Report Status PENDING  Incomplete  Resp Panel by RT-PCR (Flu A&B, Covid) Nasopharyngeal Swab     Status: None   Collection Time: 07/12/20  2:27 AM   Specimen: Nasopharyngeal Swab; Nasopharyngeal(NP) swabs in vial transport medium  Result Value Ref Range Status   SARS Coronavirus 2 by RT PCR NEGATIVE NEGATIVE Final    Comment: (NOTE) SARS-CoV-2 target nucleic acids are NOT DETECTED.  The SARS-CoV-2 RNA is generally detectable in upper respiratory specimens during the acute phase  of infection. The lowest concentration of SARS-CoV-2 viral copies this assay can detect is 138 copies/mL. A negative result does not preclude SARS-Cov-2 infection and should not be used as the sole basis for treatment or other patient management decisions. A negative result may occur with  improper specimen collection/handling, submission of specimen other than nasopharyngeal swab, presence of viral mutation(s) within the areas targeted by this assay, and inadequate number of viral copies(<138 copies/mL). A negative result must be combined with clinical observations, patient history, and epidemiological information. The expected result is Negative.  Fact Sheet for Patients:  BloggerCourse.com  Fact Sheet for Healthcare Providers:  SeriousBroker.it  This test is no t yet approved or cleared by the [place] FDA and  has been authorized for detection and/or diagnosis of SARS-CoV-2 by FDA under an Emergency Use Authorization (EUA). This EUA will remain  in effect (meaning this test can be used) for the duration of the COVID-19 declaration under Section 564(b)(1) of the Act, 21 U.S.C.section 360bbb-3(b)(1), unless the authorization is terminated  or revoked sooner.       Influenza A by PCR NEGATIVE NEGATIVE Final   Influenza B by PCR NEGATIVE NEGATIVE Final    Comment: (NOTE) The Xpert Xpress SARS-CoV-2/FLU/RSV plus assay is intended as an aid in the diagnosis of influenza from Nasopharyngeal swab specimens and should not be used as a sole basis for treatment. Nasal washings and aspirates are unacceptable for Xpert Xpress SARS-CoV-2/FLU/RSV testing.  Fact Sheet for Patients: BloggerCourse.com  Fact Sheet for Healthcare Providers: SeriousBroker.it  This test is not yet approved or cleared by the [place] FDA and has been authorized for detection and/or diagnosis of  SARS-CoV-2 by FDA under an Emergency Use Authorization (EUA). This EUA will remain in effect (meaning this test can be used) for the duration of the COVID-19 declaration under Section 564(b)(1) of the Act, 21 U.S.C. section 360bbb-3(b)(1), unless the authorization is terminated or revoked.  Performed at [facility], [address] [place], [place] [address]      Time coordinating discharge: 40 minutes  SIGNED:   Alba CoryBelkys A Zakkiyya Barno, MD  [facility]

## 2020-07-14 NOTE — TOC Progression Note (Signed)
Transition of Care [facility]) - Progression Note    Patient Details  Name: Miguel [name] MRN: [id number] Date of Birth: 04/14/1990  [facility]) CM/SW Contact  Golda Acre, RN Phone Number: 07/14/2020, 9:34 AM  Clinical Narrative:    Inpatient and outpt substance abuse information given to patient.  Was calling [facility] in [place] as I left the room.   Expected Discharge Plan: Home/Self Care Barriers to Discharge: Continued Medical Work up  Expected Discharge Plan and Services Expected Discharge Plan: Home/Self Care   Discharge Planning Services: CM Consult   Living arrangements for the past 2 months: Single Family Home Expected Discharge Date:  (unknown)                                     Social Determinants of Health (SDOH) Interventions    Readmission Risk Interventions No flowsheet data found.

## 2020-07-16 LAB — CULTURE, BLOOD (SINGLE)
Culture: NO GROWTH
Special Requests: ADEQUATE

## 2020-07-22 ENCOUNTER — Encounter (HOSPITAL_COMMUNITY): Payer: Self-pay | Admitting: Emergency Medicine

## 2020-07-22 ENCOUNTER — Ambulatory Visit (HOSPITAL_COMMUNITY)
Admission: EM | Admit: 2020-07-22 | Discharge: 2020-07-22 | Disposition: A | Discharge status: Home / Self Care | Payer: Medicaid Other | Attending: Urgent Care | Admitting: Urgent Care

## 2020-07-22 DIAGNOSIS — R300 Dysuria: Secondary | ICD-10-CM

## 2020-07-22 DIAGNOSIS — Z202 Contact with and (suspected) exposure to infections with a predominantly sexual mode of transmission: Secondary | ICD-10-CM | POA: Diagnosis present

## 2020-07-22 LAB — POCT URINALYSIS DIPSTICK, ED / UC
Bilirubin Urine: NEGATIVE
Glucose, UA: NEGATIVE mg/dL
Ketones, ur: NEGATIVE mg/dL
Nitrite: NEGATIVE
Protein, ur: NEGATIVE mg/dL
Specific Gravity, Urine: 1.025 (ref 1.005–1.030)
Urobilinogen, UA: 0.2 mg/dL (ref 0.0–1.0)
pH: 5.5 (ref 5.0–8.0)

## 2020-07-22 MED ORDER — METRONIDAZOLE 500 MG PO TABS: 500.0000 mg | ORAL_TABLET | Freq: Two times a day (BID) | ORAL | 0 refills | Status: DC

## 2020-07-22 NOTE — ED Provider Notes (Signed)
[facility]   MRN: [id number] DOB: 1990/11/23  Subjective:   Miguel [name] is a 30 y.o. male presenting for 4 to 5-day history of acute onset dysuria, intermittent mild testicle pain.  Patient is sexually active with 1 male partner, does not use condoms for protection.  She recently tested positive for bacterial vaginosis, yeast vaginitis and trichomoniasis.  Patient would like to be tested and treated as well.  Denies fever, nausea, vomiting, abdominal pelvic pain, genital rash, penile pain, penile discharge.  No current facility-administered medications for this encounter. No current outpatient medications on file.   Allergies  Allergen Reactions   Tramadol Hives   Vicodin [Hydrocodone-Acetaminophen] Nausea Only    Past Medical History:  Diagnosis Date   Bipolar 1 disorder (HCC)    Depression    Hand fracture, left 01/30/2008   Hand fracture, right 01/29/2006   no surgery required   IV drug user    Schizophrenia [facility])      Past Surgical History:  Procedure Laterality Date   CYST EXCISION     left hand fracture Left 2010   Dr Magnus Ivan    Family History  Problem Relation Age of Onset   Bipolar disorder Mother    Schizophrenia Mother    Schizophrenia Sister     Social History   Tobacco Use   Smoking status: Every Day    Pack years: 0.00    Types: Cigarettes   Smokeless tobacco: Never  Substance Use Topics   Alcohol use: No    Comment: He drank this time in effort to hurt self/ usually non drinker   Drug use: No    ROS   Objective:   Vitals: BP 129/84 (BP Location: Right Arm)   Pulse 83   Temp 98.3 F (36.8 C) (Oral)   Resp 17   SpO2 98%   Physical Exam Constitutional:      General: He is not in acute distress.    Appearance: Normal appearance. He is well-developed and normal weight. He is not ill-appearing, toxic-appearing or diaphoretic.  HENT:     Head: Normocephalic and atraumatic.     Right Ear: External ear  normal.     Left Ear: External ear normal.     Nose: Nose normal.     Mouth/Throat:     Pharynx: Oropharynx is clear.  Eyes:     General: No scleral icterus.       Right eye: No discharge.        Left eye: No discharge.     Extraocular Movements: Extraocular movements intact.     Pupils: Pupils are equal, round, and reactive to light.  Cardiovascular:     Rate and Rhythm: Normal rate.  Pulmonary:     Effort: Pulmonary effort is normal.  Musculoskeletal:     Cervical back: Normal range of motion.  Neurological:     Mental Status: He is alert and oriented to person, place, and time.  Psychiatric:        Mood and Affect: Mood normal.        Behavior: Behavior normal.        Thought Content: Thought content normal.        Judgment: Judgment normal.    Results for orders placed or performed during the hospital encounter of 07/22/20 (from the past 24 hour(s))  POC Urinalysis dipstick     Status: Abnormal   Collection Time: 07/22/20  4:10 PM  Result Value Ref Range  Glucose, UA NEGATIVE NEGATIVE mg/dL   Bilirubin Urine NEGATIVE NEGATIVE   Ketones, ur NEGATIVE NEGATIVE mg/dL   Specific Gravity, Urine 1.025 1.005 - 1.030   Hgb urine dipstick TRACE (A) NEGATIVE   pH 5.5 5.0 - 8.0   Protein, ur NEGATIVE NEGATIVE mg/dL   Urobilinogen, UA 0.2 0.0 - 1.0 mg/dL   Nitrite NEGATIVE NEGATIVE   Leukocytes,Ua SMALL (A) NEGATIVE    Assessment and Plan :   PDMP not reviewed this encounter.  1. Dysuria   2. STD exposure   3. Exposure to trichomonas     We will treat empirically with Flagyl given his exposure.  Labs pending. Counseled on safe sex practices including abstaining for 1 week following treatment.  Counseled patient on potential for adverse effects with medications prescribed/recommended today, ER and return-to-clinic precautions discussed, patient verbalized understanding.    Wallis Bamberg, [place] 07/22/20 [phone]

## 2020-07-22 NOTE — Discharge Instructions (Addendum)
Avoid all forms of sexual intercourse (oral, vaginal, anal) for the next 7 days to avoid spreading/reinfecting. Return if symptoms worsen/do not resolve, you develop fever, abdominal pain, blood in your urine, or are re-exposed to an STI.  

## 2020-07-22 NOTE — ED Triage Notes (Signed)
Pt presents with dysuria xs 4-5 days. States girlfriend has recently tested positive for STD.

## 2020-07-25 LAB — CYTOLOGY, (ORAL, ANAL, URETHRAL) ANCILLARY ONLY
Chlamydia: NEGATIVE
Comment: NEGATIVE
Comment: NEGATIVE
Comment: NORMAL
Neisseria Gonorrhea: NEGATIVE
Trichomonas: POSITIVE — AB

## 2020-11-05 ENCOUNTER — Encounter (HOSPITAL_COMMUNITY): Payer: Self-pay | Admitting: Emergency Medicine

## 2020-11-05 ENCOUNTER — Emergency Department (HOSPITAL_COMMUNITY): Payer: Medicaid Other

## 2020-11-05 ENCOUNTER — Other Ambulatory Visit: Payer: Self-pay

## 2020-11-05 ENCOUNTER — Emergency Department (HOSPITAL_COMMUNITY)
Admission: EM | Admit: 2020-11-05 | Discharge: 2020-11-06 | Disposition: A | Discharge status: Home / Self Care | Payer: Medicaid Other | Attending: Emergency Medicine | Admitting: Emergency Medicine

## 2020-11-05 DIAGNOSIS — R079 Chest pain, unspecified: Secondary | ICD-10-CM | POA: Diagnosis not present

## 2020-11-05 DIAGNOSIS — R209 Unspecified disturbances of skin sensation: Secondary | ICD-10-CM | POA: Insufficient documentation

## 2020-11-05 DIAGNOSIS — Z5321 Procedure and treatment not carried out due to patient leaving prior to being seen by health care provider: Secondary | ICD-10-CM | POA: Diagnosis not present

## 2020-11-05 DIAGNOSIS — G8929 Other chronic pain: Secondary | ICD-10-CM | POA: Diagnosis not present

## 2020-11-05 DIAGNOSIS — M545 Low back pain, unspecified: Secondary | ICD-10-CM | POA: Insufficient documentation

## 2020-11-05 DIAGNOSIS — R0602 Shortness of breath: Secondary | ICD-10-CM | POA: Insufficient documentation

## 2020-11-05 DIAGNOSIS — R4182 Altered mental status, unspecified: Secondary | ICD-10-CM | POA: Insufficient documentation

## 2020-11-05 DIAGNOSIS — R531 Weakness: Secondary | ICD-10-CM | POA: Diagnosis not present

## 2020-11-05 DIAGNOSIS — R2243 Localized swelling, mass and lump, lower limb, bilateral: Secondary | ICD-10-CM | POA: Insufficient documentation

## 2020-11-05 LAB — RAPID URINE DRUG SCREEN, HOSP PERFORMED
Amphetamines: NOT DETECTED
Barbiturates: NOT DETECTED
Benzodiazepines: NOT DETECTED
Cocaine: NOT DETECTED
Opiates: NOT DETECTED
Tetrahydrocannabinol: NOT DETECTED

## 2020-11-05 LAB — URINALYSIS, ROUTINE W REFLEX MICROSCOPIC
Bilirubin Urine: NEGATIVE
Glucose, UA: NEGATIVE mg/dL
Hgb urine dipstick: NEGATIVE
Ketones, ur: NEGATIVE mg/dL
Leukocytes,Ua: NEGATIVE
Nitrite: NEGATIVE
Protein, ur: NEGATIVE mg/dL
Specific Gravity, Urine: 1.016 (ref 1.005–1.030)
pH: 7 (ref 5.0–8.0)

## 2020-11-05 LAB — LIPASE, BLOOD: Lipase: 28 U/L (ref 11–51)

## 2020-11-05 LAB — CBC WITH DIFFERENTIAL/PLATELET
Abs Immature Granulocytes: 0.05 10*3/uL (ref 0.00–0.07)
Basophils Absolute: 0.1 10*3/uL (ref 0.0–0.1)
Basophils Relative: 1 %
Eosinophils Absolute: 0.4 10*3/uL (ref 0.0–0.5)
Eosinophils Relative: 3 %
HCT: 45.4 % (ref 39.0–52.0)
Hemoglobin: 15 g/dL (ref 13.0–17.0)
Immature Granulocytes: 0 %
Lymphocytes Relative: 14 %
Lymphs Abs: 1.7 10*3/uL (ref 0.7–4.0)
MCH: 28.7 pg (ref 26.0–34.0)
MCHC: 33 g/dL (ref 30.0–36.0)
MCV: 87 fL (ref 80.0–100.0)
Monocytes Absolute: 1 10*3/uL (ref 0.1–1.0)
Monocytes Relative: 8 %
Neutro Abs: 9 10*3/uL — ABNORMAL HIGH (ref 1.7–7.7)
Neutrophils Relative %: 74 %
Platelets: 247 10*3/uL (ref 150–400)
RBC: 5.22 MIL/uL (ref 4.22–5.81)
RDW: 12.3 % (ref 11.5–15.5)
WBC: 12.1 10*3/uL — ABNORMAL HIGH (ref 4.0–10.5)
nRBC: 0 % (ref 0.0–0.2)

## 2020-11-05 LAB — COMPREHENSIVE METABOLIC PANEL
ALT: 22 U/L (ref 0–44)
AST: 25 U/L (ref 15–41)
Albumin: 3.7 g/dL (ref 3.5–5.0)
Alkaline Phosphatase: 79 U/L (ref 38–126)
Anion gap: 8 (ref 5–15)
BUN: 8 mg/dL (ref 6–20)
CO2: 27 mmol/L (ref 22–32)
Calcium: 8.9 mg/dL (ref 8.9–10.3)
Chloride: 104 mmol/L (ref 98–111)
Creatinine, Ser: 0.77 mg/dL (ref 0.61–1.24)
GFR, Estimated: >60 mL/min (ref >60)
Glucose, Bld: 88 mg/dL (ref 70–99)
Potassium: 4.4 mmol/L (ref 3.5–5.1)
Sodium: 139 mmol/L (ref 135–145)
Total Bilirubin: 0.5 mg/dL (ref 0.3–1.2)
Total Protein: 6.6 g/dL (ref 6.5–8.1)

## 2020-11-05 LAB — TROPONIN I (HIGH SENSITIVITY)
Troponin I (High Sensitivity): 3 ng/L (ref <18)
Troponin I (High Sensitivity): 3 ng/L (ref <18)

## 2020-11-05 LAB — ACETAMINOPHEN LEVEL: Acetaminophen (Tylenol), Serum: <10 ug/mL — ABNORMAL LOW (ref 10–30)

## 2020-11-05 LAB — ETHANOL: Alcohol, Ethyl (B): <10 mg/dL (ref <10)

## 2020-11-05 LAB — CK: Total CK: 154 U/L (ref 49–397)

## 2020-11-05 LAB — SALICYLATE LEVEL: Salicylate Lvl: <7 mg/dL — ABNORMAL LOW (ref 7.0–30.0)

## 2020-11-05 NOTE — ED Provider Notes (Signed)
Emergency Medicine Provider Triage Evaluation Note  Miguel [name] , [name] 30 y.o. male  was evaluated in triage.  Pt complains of multiple complaints.  Patient states at the beginning of last week he started getting weak on his left upper extremity.  Then developed some tingling.  Now he states this is in his bilateral upper extremities.  Family in the room states patient is having intermittent altered mental status.  He feels like he has some ankle swelling.  He is also having some intermittent chest pain, shortness of breath.  He has chronic lumbar back pain.  Is also having some emesis. Hx of polysubstance use in EPIC  Review of Systems  Positive: Numbness, weakness, intermittent altered mental status, Negative: Fever  Physical Exam  BP (!) 128/106 (BP Location: Right Arm)   Pulse 90   Temp 98.6 F (37 C) (Oral)   Resp 16   SpO2 97%  Gen:   Awake, no distress   Resp:  Normal effort  Cardiac: 2+ radial pulses MSK:   Moves extremities without difficulty  Neuro:  Decreased sensation to LUE, decreased strength to LUE Other:    Medical Decision Making  Medically screening exam initiated at 5:14 PM.  Appropriate orders placed.  Miguel [name] was informed that the remainder of the evaluation will be completed by another provider, this initial triage assessment does not replace that evaluation, and the importance of remaining in the ED until their evaluation is complete.  Patient with weakness, subjective decreased sensation to left upper extremity however symptoms x1 week.  Intermittent altered mental status per family however patient ANO x3 here.  Has an intermittent chest pain, shortness of breath however lungs clear here VS signs stable.  Does not meet time for tPA, LVO, low suspicion for dissection.   Miguel [name] A, PA-C 11/05/20 1715    Mancel Bale, MD 11/06/20 [phone]

## 2020-11-05 NOTE — ED Triage Notes (Addendum)
C/o intermittent bilateral arm numbness, weakness, tingling, and lower extremity swelling since cutting down a tree 1 week ago at work.  Family member reports intermittent AMS.  Pt also reports intermittent chest pain and SOB.  Chronic lower back pain.

## 2020-11-06 NOTE — ED Notes (Signed)
Pt did not respond to vital check  

## 2020-11-07 ENCOUNTER — Other Ambulatory Visit: Payer: Self-pay

## 2020-11-07 ENCOUNTER — Encounter (HOSPITAL_COMMUNITY): Payer: Self-pay

## 2020-11-07 ENCOUNTER — Emergency Department (HOSPITAL_COMMUNITY): Payer: Medicaid Other

## 2020-11-07 ENCOUNTER — Emergency Department (HOSPITAL_COMMUNITY)
Admission: EM | Admit: 2020-11-07 | Discharge: 2020-11-08 | Disposition: A | Discharge status: Home / Self Care | Payer: Medicaid Other | Attending: Emergency Medicine | Admitting: Emergency Medicine

## 2020-11-07 DIAGNOSIS — Z5321 Procedure and treatment not carried out due to patient leaving prior to being seen by health care provider: Secondary | ICD-10-CM | POA: Insufficient documentation

## 2020-11-07 DIAGNOSIS — R200 Anesthesia of skin: Secondary | ICD-10-CM | POA: Insufficient documentation

## 2020-11-07 DIAGNOSIS — M6281 Muscle weakness (generalized): Secondary | ICD-10-CM | POA: Insufficient documentation

## 2020-11-07 NOTE — ED Provider Notes (Signed)
Emergency Medicine Provider Triage Evaluation Note  Miguel [name] , a 30 y.o. male  was evaluated in triage.  Pt complains of left-sided weakness and numbness.  Patient reports that symptoms have been present over the last 10 to 12 days.  Denies any recent falls or traumatic injuries.  Per chart review patient went to [facility] emergency department on 10/8 for similar symptoms.  Patient had noncontrasted CT which was unremarkable.  Review of Systems  Positive: Left-sided numbness and weakness Negative: Visual disturbance  Physical Exam  There were no vitals taken for this visit. Gen:   Awake, no distress   Resp:  Normal effort  MSK:   Moves extremities without difficulty  Other:  Patient able to stand and ambulate without difficulty.  CN II through XII intact.  Pronator drift negative.  Patient reports decree sensation to light touch to left face, left arm, and left leg.  Medical Decision Making  Medically screening exam initiated at 8:15 PM.  Appropriate orders placed.  Miguel [name] was informed that the remainder of the evaluation will be completed by another provider, this initial triage assessment does not replace that evaluation, and the importance of remaining in the ED until their evaluation is complete.  Patient outside CVA window.  Will obtain MRI imaging   Berneice Heinrich 11/07/20 2018    Wynetta Fines, MD 11/07/20 2312

## 2020-11-07 NOTE — ED Triage Notes (Signed)
Pt states 10-12 days ago he stated having left sided weakness and numbness to arm and leg. Pt c/o left sided arm pain. Theron Arista, PA bedside with pt. Pt denies recent fall/injury. Pt states he can feel someone touching his left side but feels less than right side.

## 2020-11-08 ENCOUNTER — Encounter (HOSPITAL_COMMUNITY): Payer: Self-pay | Admitting: Emergency Medicine

## 2020-11-08 ENCOUNTER — Ambulatory Visit (HOSPITAL_COMMUNITY)
Admission: EM | Admit: 2020-11-08 | Discharge: 2020-11-08 | Disposition: A | Discharge status: Home / Self Care | Payer: Medicaid Other

## 2020-11-08 DIAGNOSIS — R531 Weakness: Secondary | ICD-10-CM | POA: Diagnosis not present

## 2020-11-08 LAB — CBC WITH DIFFERENTIAL/PLATELET
Abs Immature Granulocytes: 0.05 10*3/uL (ref 0.00–0.07)
Basophils Absolute: 0.1 10*3/uL (ref 0.0–0.1)
Basophils Relative: 1 %
Eosinophils Absolute: 0.3 10*3/uL (ref 0.0–0.5)
Eosinophils Relative: 3 %
HCT: 46 % (ref 39.0–52.0)
Hemoglobin: 15.3 g/dL (ref 13.0–17.0)
Immature Granulocytes: 1 %
Lymphocytes Relative: 24 %
Lymphs Abs: 2.5 10*3/uL (ref 0.7–4.0)
MCH: 29.3 pg (ref 26.0–34.0)
MCHC: 33.3 g/dL (ref 30.0–36.0)
MCV: 88.1 fL (ref 80.0–100.0)
Monocytes Absolute: 0.6 10*3/uL (ref 0.1–1.0)
Monocytes Relative: 6 %
Neutro Abs: 6.7 10*3/uL (ref 1.7–7.7)
Neutrophils Relative %: 65 %
Platelets: 231 10*3/uL (ref 150–400)
RBC: 5.22 MIL/uL (ref 4.22–5.81)
RDW: 12.6 % (ref 11.5–15.5)
WBC: 10.2 10*3/uL (ref 4.0–10.5)
nRBC: 0 % (ref 0.0–0.2)

## 2020-11-08 LAB — BASIC METABOLIC PANEL
Anion gap: 9 (ref 5–15)
BUN: 10 mg/dL (ref 6–20)
CO2: 28 mmol/L (ref 22–32)
Calcium: 9.2 mg/dL (ref 8.9–10.3)
Chloride: 101 mmol/L (ref 98–111)
Creatinine, Ser: 0.65 mg/dL (ref 0.61–1.24)
GFR, Estimated: >60 mL/min (ref >60)
Glucose, Bld: 86 mg/dL (ref 70–99)
Potassium: 4.1 mmol/L (ref 3.5–5.1)
Sodium: 138 mmol/L (ref 135–145)

## 2020-11-08 LAB — RAPID URINE DRUG SCREEN, HOSP PERFORMED
Amphetamines: NOT DETECTED
Barbiturates: NOT DETECTED
Benzodiazepines: NOT DETECTED
Cocaine: NOT DETECTED
Opiates: NOT DETECTED
Tetrahydrocannabinol: NOT DETECTED

## 2020-11-08 LAB — ETHANOL: Alcohol, Ethyl (B): <10 mg/dL (ref <10)

## 2020-11-08 NOTE — ED Notes (Signed)
Pt called x2 at noted times. Currently not present and did not respond to name call.

## 2020-11-08 NOTE — ED Notes (Signed)
Pt called x3 at noted times. Pt currently not present and did not respond to name call. Triage RN notified. 

## 2020-11-08 NOTE — ED Notes (Signed)
Pt called x1 in lobby. Currently not present and did not respond to name call.

## 2020-11-08 NOTE — ED Provider Notes (Signed)
[facility]    CSN: [id number] Arrival date & time: 11/08/20  1127      History   Chief Complaint Chief Complaint  Patient presents with   Numbness    HPI Miguel [name] is a 30 y.o. male presenting with multiple complaints.  Describes left-sided weakness and numbness for the last 12 days, getting worse, now associated with dizziness.  Describes transient altered mental status and difficulty focusing.  Has presented to the emergency department on both 10/8 and 10/10 for symptoms, did not wait to be seen either time.  Describes left-sided weakness, worse in the morning, associated with tingling and numbness.  Arm is worse than leg.  States he can barely move his arm he wakes up.  States that he is now having some right-sided symptoms as well.  Does note some vomiting, chest pain, shortness of breath, but none currently.  Documented history polysubstance abuse, IV drug abuse, schizophrenia, encephalopathy.   HPI  Past Medical History:  Diagnosis Date   Bipolar 1 disorder (HCC)    Depression    Hand fracture, left 01/30/2008   Hand fracture, right 01/29/2006   no surgery required   IV drug user    Schizophrenia [facility]     Patient Active Problem List   Diagnosis Date Noted   Altered mental status 07/12/2020   Encephalopathy 07/12/2020   Substance abuse (HCC)    Schizophrenia (HCC) 07/11/2020   Depression 07/11/2020   Bipolar 1 disorder (HCC) 07/11/2020   Opiate overdose (HCC) 07/10/2020   Polysubstance dependence including opioid type drug with complication, continuous use (HCC) 03/14/2015   Major depressive disorder, single episode, severe without psychotic features (HCC) 03/13/2015   Cocaine use disorder, moderate, 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   Tobacco use disorder 09/22/2014   Sedative, hypnotic or anxiolytic use disorder, severe, 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 Authorizing Provider  metroNIDAZOLE (FLAGYL) 500 MG tablet Take 1 tablet (500 mg total) by mouth 2 (two) times daily with a meal. DO NOT CONSUME ALCOHOL WHILE TAKING THIS MEDICATION. 07/22/20   Wallis Bamberg, PA-C    Family History Family History  Problem Relation Age of Onset   Bipolar disorder Mother    Schizophrenia Mother    Schizophrenia Sister     Social History Social History   Tobacco Use   Smoking status: Every Day    Types: Cigarettes   Smokeless tobacco: Never  Substance Use Topics   Alcohol use: No    Comment: He drank this time in effort to hurt self/ usually non drinker   Drug use: No     Allergies   Tramadol and Vicodin [hydrocodone-acetaminophen]   Review of Systems Review of Systems  Neurological:  Positive for weakness.  All other systems reviewed and are negative.   Physical Exam Triage Vital Signs ED Triage Vitals  Enc Vitals Group     BP 11/08/20 1233 (!) 118/94     Pulse Rate 11/08/20 1233  74     Resp 11/08/20 1233 17     Temp 11/08/20 1233 98.1 F (36.7 C)     Temp Source 11/08/20 1233 Oral     SpO2 11/08/20 1233 98 %     Weight --      Height --      Head Circumference --      Peak Flow --      Pain Score 11/08/20 1230 0     Pain Loc --      Pain Edu? --      Excl. in GC? --    No data found.  Updated Vital Signs BP (!) 118/94 (BP Location: Right Arm)   Pulse 74   Temp 98.1 F (36.7 C) (Oral)   Resp 17   SpO2 98%   Visual Acuity Right Eye Distance:   Left Eye Distance:   Bilateral Distance:    Right Eye  Near:   Left Eye Near:    Bilateral Near:     Physical Exam Vitals reviewed.  Constitutional:      General: He is not in acute distress.    Appearance: Normal appearance. He is not ill-appearing.  HENT:     Head: Normocephalic and atraumatic.  Eyes:     Extraocular Movements: Extraocular movements intact.  Cardiovascular:     Rate and Rhythm: Normal rate and regular rhythm.     Heart sounds: Normal heart sounds.  Pulmonary:     Effort: Pulmonary effort is normal.     Breath sounds: Normal breath sounds. No wheezing, rhonchi or rales.  Musculoskeletal:     Cervical back: Normal range of motion and neck supple. No rigidity.  Lymphadenopathy:     Cervical: No cervical adenopathy.  Skin:    Capillary Refill: Capillary refill takes less than 2 seconds.  Neurological:     General: No focal deficit present.     Mental Status: He is alert and oriented to person, place, and time. Mental status is at baseline.     Cranial Nerves: Cranial nerves are intact. No cranial nerve deficit or facial asymmetry.     Sensory: Sensation is intact. No sensory deficit.     Motor: Motor function is intact. No weakness.     Coordination: Coordination is intact. Coordination normal.     Gait: Gait is intact. Gait normal.     Comments: CN 2-12 grossly intact though some difficulty with EOMIs. Pinpoint pupils. AO x3. Decreased sensation L UE. Gait intact.   Psychiatric:        Mood and Affect: Mood normal.        Behavior: Behavior normal.        Thought Content: Thought content normal.        Judgment: Judgment normal.     UC Treatments / Results  Labs (all labs ordered are listed, but only abnormal results are displayed) Labs Reviewed - No data to display  EKG   Radiology MR BRAIN WO CONTRAST  Result Date: 11/07/2020 CLINICAL DATA:  Acute neurologic deficit EXAM: MRI HEAD WITHOUT CONTRAST TECHNIQUE: Multiplanar, multiecho pulse sequences of the brain and surrounding structures were obtained  without intravenous contrast. COMPARISON:  None. FINDINGS: Brain: No acute infarct, mass effect or extra-axial collection. No acute or chronic hemorrhage. Normal white matter signal, parenchymal volume and CSF spaces. The midline structures are normal. Vascular: Major flow voids are preserved. Skull and upper cervical spine: Normal calvarium and skull base. Visualized upper cervical spine and soft tissues are  normal. Sinuses/Orbits:No paranasal sinus fluid levels or advanced mucosal thickening. No mastoid or middle ear effusion. Normal orbits. IMPRESSION: Normal brain MRI. Electronically Signed   By: Deatra Robinson M.D.   On: 11/07/2020 21:57    Procedures Procedures (including critical care time)  Medications Ordered in UC Medications - No data to display  Initial Impression / Assessment and Plan / UC Course  I have reviewed the triage vital signs and the nursing notes.  Pertinent labs & imaging results that were available during my care of the patient were reviewed by me and considered in my medical decision making (see chart for details).     This patient is a very pleasant 30 y.o. year old male presenting with left-sided weakness, dizziness x13 days, getting worse.  Neurological exam is reassuring, but the symptoms are getting worse and he is having some difficulty with extraocular movements, I am recommending that he head to the emergency department.  He has been evaluated in the ED on 10/8 and 10/10 including MRI and CT of head, but he did not stay to be seen.  So far imaging and lab work is normal, but I am concerned as symptoms are changing.[name]  Head straight to the emergency department.  Girlfriend will drive.  Final Clinical Impressions(s) / UC Diagnoses   Final diagnoses:  Left-sided weakness     Discharge Instructions      -I am concerned about your symptoms, especially as they are changing and getting worse.  Please head to the emergency department and make sure that you are  often drowsy.  Please actually stay to be seen this time, as the provider who does your imaging should also be the one to give you the results.   ED Prescriptions   None    PDMP not reviewed this encounter.   Rhys Martini, PA-C 11/08/20 1325

## 2020-11-08 NOTE — ED Triage Notes (Signed)
Pt had fatigue left sided weakness and numbness for 10-13 days. Had CT scan, MRI yesterday at [facility] but not seen by a provider before he left. Reports moving to right hand some.

## 2020-11-08 NOTE — Discharge Instructions (Addendum)
-  I am concerned about your symptoms, especially as they are changing and getting worse.  Please head to the emergency department and make sure that you are often drowsy.  Please actually stay to be seen this time, as the provider who does your imaging should also be the one to give you the results.

## 2021-12-09 ENCOUNTER — Encounter (HOSPITAL_COMMUNITY): Payer: Self-pay

## 2021-12-09 ENCOUNTER — Ambulatory Visit (HOSPITAL_COMMUNITY)
Admission: EM | Admit: 2021-12-09 | Discharge: 2021-12-09 | Disposition: A | Discharge status: Home / Self Care | Payer: Medicaid Other | Attending: Physician Assistant | Admitting: Physician Assistant

## 2021-12-09 DIAGNOSIS — S50861A Insect bite (nonvenomous) of right forearm, initial encounter: Secondary | ICD-10-CM

## 2021-12-09 DIAGNOSIS — W57XXXA Bitten or stung by nonvenomous insect and other nonvenomous arthropods, initial encounter: Secondary | ICD-10-CM

## 2021-12-09 DIAGNOSIS — M79631 Pain in right forearm: Secondary | ICD-10-CM | POA: Diagnosis not present

## 2021-12-09 DIAGNOSIS — L03113 Cellulitis of right upper limb: Secondary | ICD-10-CM

## 2021-12-09 MED ORDER — CEFTRIAXONE SODIUM 500 MG IJ SOLR
500.0000 mg | INTRAMUSCULAR | Status: DC
Start: 2021-12-09 — End: 2021-12-09
  Administered 2021-12-09: 500 mg via INTRAMUSCULAR

## 2021-12-09 MED ORDER — CEFTRIAXONE SODIUM 500 MG IJ SOLR
INTRAMUSCULAR | Status: AC
  Filled 2021-12-09: qty 500

## 2021-12-09 MED ORDER — IBUPROFEN 600 MG PO TABS: 600.0000 mg | ORAL_TABLET | Freq: Four times a day (QID) | ORAL | 0 refills | Status: DC | PRN

## 2021-12-09 MED ORDER — SULFAMETHOXAZOLE-TRIMETHOPRIM 800-160 MG PO TABS: 1.0000 | ORAL_TABLET | Freq: Two times a day (BID) | ORAL | 0 refills | Status: AC

## 2021-12-09 NOTE — ED Triage Notes (Signed)
Pt is here for possible insect bite on the right arm x 2days

## 2021-12-09 NOTE — Discharge Instructions (Addendum)
Advised to use alternating cool and warm compresses to the area, 10 minutes on 20 minutes off, 3-4 times throughout the evening to help reduce pain and swelling in the arm. Advised take ibuprofen 600 mg every 8 hours with food to help reduce pain and swelling. Advised to take the Bactrim DS every 12 hours to treat the acute infection in the arm. Advised to follow-up with PCP or return to urgent care if symptoms fail to improve.

## 2021-12-09 NOTE — ED Provider Notes (Signed)
[facility]    CSN: [id number] Arrival date & time: 12/09/21  1436      History   Chief Complaint Chief Complaint  Patient presents with   Insect Bite    HPI Miguel [name] is a 31 y.o. male.   31 year old male presents with rash and insect bite right forearm.  Patient indicates for the past 2 days he has been having right forearm redness, swelling, pain and tenderness.  Patient indicates that he may have been bitten by an insect on the right upper posterior side of the forearm but he did not see the insect.  He indicates that since he has been having increasing pain and swelling at the bite site.  Patient indicates he is also starting to have some chills and sweats feeling a little bit feverish after the bite.  Patient denies any nausea, vomiting, or wheezing.  Patient indicates he has felt a little short winded but he does relate smoking and vaping on a regular basis.     Past Medical History:  Diagnosis Date   Bipolar 1 disorder (HCC)    Depression    Hand fracture, left 01/30/2008   Hand fracture, right 01/29/2006   no surgery required   IV drug user    Schizophrenia [facility])     Patient Active Problem List   Diagnosis Date Noted   Altered mental status 07/12/2020   Encephalopathy 07/12/2020   Substance abuse (HCC)    Schizophrenia (HCC) 07/11/2020   Depression 07/11/2020   Bipolar 1 disorder (HCC) 07/11/2020   Opiate overdose (HCC) 07/10/2020   Polysubstance dependence including opioid type drug with complication, continuous use (HCC) 03/14/2015   Major depressive disorder, single episode, severe without psychotic features (HCC) 03/13/2015   Cocaine use disorder, moderate, 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   Tobacco use disorder 09/22/2014   Sedative, hypnotic or anxiolytic use disorder, severe, 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 Authorizing Provider  ibuprofen (ADVIL) 600 MG tablet Take 1 tablet (600 mg total) by mouth every 6 (six) hours as needed. 12/09/21  Yes Ellsworth Lennox, PA-C  sulfamethoxazole-trimethoprim (BACTRIM DS) 800-160 MG tablet Take 1 tablet by mouth 2 (two) times daily for 7 days. 12/09/21 12/16/21 Yes Ellsworth Lennox, PA-C  metroNIDAZOLE (FLAGYL) 500 MG tablet Take 1 tablet (500 mg total) by mouth 2 (two) times daily with a meal. DO NOT CONSUME ALCOHOL WHILE TAKING THIS MEDICATION. 07/22/20   Wallis Bamberg, PA-C    Family History Family History  Problem Relation Age of Onset   Bipolar disorder Mother    Schizophrenia Mother    Schizophrenia Sister     Social History Social History   Tobacco Use   Smoking status: Every Day    Types: Cigarettes   Smokeless tobacco: Never  Substance Use Topics   Alcohol use: No    Comment: He drank this time in effort to hurt self/ usually non drinker   Drug use: No  Allergies   Tramadol and Vicodin [hydrocodone-acetaminophen]   Review of Systems Review of Systems  Skin:  Positive for wound (right forearm insect bite with swelling and redness).     Physical Exam Triage Vital Signs ED Triage Vitals  Enc Vitals Group     BP 12/09/21 1535 138/80     Pulse Rate 12/09/21 1535 81     Resp 12/09/21 1535 16     Temp 12/09/21 1535 98 F (36.7 C)     Temp Source 12/09/21 1535 Oral     SpO2 12/09/21 1535 100 %     Weight --      Height --      Head Circumference --      Peak Flow --      Pain Score 12/09/21 1540 10     Pain Loc --      Pain Edu? --      Excl. in GC? --     No data found.  Updated Vital Signs BP 138/80 (BP Location: Left Wrist)   Pulse 81   Temp 98 F (36.7 C) (Oral)   Resp 16   SpO2 100%   Visual Acuity Right Eye Distance:   Left Eye Distance:   Bilateral Distance:    Right Eye Near:   Left Eye Near:    Bilateral Near:     Physical Exam Constitutional:      Appearance: Normal appearance.  Skin:         Comments: Right forearm: Upper posterior forearm with a 1 x 2 cm area of redness with pointing which may be secondary to an insect bite.  There is no drainage present.  There is an additional area of redness that measures 4 x 6 cm that surrounds the bite site, there is no streaking.  Right forearm full range of motion is normal.  Neurological:     Mental Status: He is alert.      UC Treatments / Results  Labs (all labs ordered are listed, but only abnormal results are displayed) Labs Reviewed - No data to display  EKG   Radiology No results found.  Procedures Procedures (including critical care time)  Medications Ordered in UC Medications  cefTRIAXone (ROCEPHIN) injection 500 mg (has no administration in time range)    Initial Impression / Assessment and Plan / UC Course  I have reviewed the triage vital signs and the nursing notes.  Pertinent labs & imaging results that were available during my care of the patient were reviewed by me and considered in my medical decision making (see chart for details).    Plan: 1.  The right forearm pain will be treated with the followingA.  Ibuprofen 600 mg every 6 hours on a regular basis with food to help reduce pain. 2.  The cellulitis of the right upper extremity will be treated with the following: A.  Rocephin 500 mg IM given in the office today. B.  Bactrim DS, 1 tablet every 12 hours to treat infection. 3.  The insect bite of the right forearm will be treated with the following: A.  Ibuprofen 600 mg every 6 hours on a regular basis to treat the pain and  swelling. B.  Advised alternating cool and warm compresses, 10 minutes on 20 minutes off, 3-4 times a day to help reduce swelling and pain. 4.  Advised follow-up PCP or return to urgent care if symptoms fail to improve. Final Clinical Impressions(s) / UC Diagnoses   Final diagnoses:  Right forearm pain  Cellulitis of right upper extremity  Insect bite of right forearm, initial encounter     Discharge Instructions      Advised to use alternating cool and warm compresses to the area, 10 minutes on 20 minutes off, 3-4 times throughout the evening to help reduce pain and swelling in the arm. Advised take ibuprofen 600 mg every 8 hours with food to help reduce pain and swelling. Advised to take the Bactrim DS every 12 hours to treat the acute infection in the arm. Advised to follow-up with PCP or return to urgent care if symptoms fail to improve.    ED Prescriptions     Medication Sig Dispense Auth. Provider   sulfamethoxazole-trimethoprim (BACTRIM DS) 800-160 MG tablet Take 1 tablet by mouth 2 (two) times daily for 7 days. 14 tablet Ellsworth Lennox, PA-C   ibuprofen (ADVIL) 600 MG tablet Take 1 tablet (600 mg total) by mouth every 6 (six) hours as needed. 30 tablet Ellsworth Lennox, PA-C      PDMP not reviewed this encounter.   Ellsworth Lennox, PA-C 12/09/21 1628

## 2021-12-28 ENCOUNTER — Encounter: Payer: Self-pay | Admitting: Family

## 2021-12-28 ENCOUNTER — Ambulatory Visit: Payer: Self-pay | Admitting: Family

## 2021-12-28 NOTE — Progress Notes (Signed)
  This encounter was created in error - please disregard. No show 

## 2022-05-05 ENCOUNTER — Encounter (HOSPITAL_COMMUNITY): Payer: Self-pay | Admitting: *Deleted

## 2022-05-05 ENCOUNTER — Ambulatory Visit (HOSPITAL_COMMUNITY)
Admission: EM | Admit: 2022-05-05 | Discharge: 2022-05-05 | Disposition: A | Discharge status: Home / Self Care | Payer: Medicaid Other | Attending: Physician Assistant | Admitting: Physician Assistant

## 2022-05-05 DIAGNOSIS — R319 Hematuria, unspecified: Secondary | ICD-10-CM | POA: Insufficient documentation

## 2022-05-05 DIAGNOSIS — R808 Other proteinuria: Secondary | ICD-10-CM | POA: Insufficient documentation

## 2022-05-05 DIAGNOSIS — R300 Dysuria: Secondary | ICD-10-CM | POA: Insufficient documentation

## 2022-05-05 LAB — COMPREHENSIVE METABOLIC PANEL
ALT: 17 U/L (ref 0–44)
AST: 21 U/L (ref 15–41)
Albumin: 3.6 g/dL (ref 3.5–5.0)
Alkaline Phosphatase: 77 U/L (ref 38–126)
Anion gap: 8 (ref 5–15)
BUN: 9 mg/dL (ref 6–20)
CO2: 26 mmol/L (ref 22–32)
Calcium: 8.7 mg/dL — ABNORMAL LOW (ref 8.9–10.3)
Chloride: 102 mmol/L (ref 98–111)
Creatinine, Ser: 0.85 mg/dL (ref 0.61–1.24)
GFR, Estimated: >60 mL/min (ref >60)
Glucose, Bld: 71 mg/dL (ref 70–99)
Potassium: 4.2 mmol/L (ref 3.5–5.1)
Sodium: 136 mmol/L (ref 135–145)
Total Bilirubin: 0.3 mg/dL (ref 0.3–1.2)
Total Protein: 6.5 g/dL (ref 6.5–8.1)

## 2022-05-05 LAB — POCT URINALYSIS DIPSTICK, ED / UC
Bilirubin Urine: NEGATIVE
Glucose, UA: NEGATIVE mg/dL
Ketones, ur: NEGATIVE mg/dL
Nitrite: NEGATIVE
Protein, ur: 100 mg/dL — AB
Specific Gravity, Urine: >=1.03 (ref 1.005–1.030)
Urobilinogen, UA: 0.2 mg/dL (ref 0.0–1.0)
pH: 5.5 (ref 5.0–8.0)

## 2022-05-05 MED ORDER — DOXYCYCLINE HYCLATE 100 MG PO CAPS: 100.0000 mg | ORAL_CAPSULE | Freq: Two times a day (BID) | ORAL | 0 refills | Status: DC

## 2022-05-05 NOTE — ED Triage Notes (Addendum)
C/O dysuria onset 2 wks ago along with urinary urgency and polyuria. Denies penile discharge. States has been taking AZO pills without relief.

## 2022-05-05 NOTE — Discharge Instructions (Addendum)
Lab results to be completed in 48 hours.  If you do not get a call from this office that indicates the test are negative.  Log onto MyChart to be the test results when they post in 48 hours.  Advised take the doxycycline 100 mg every 12 hours until completed as this will treat infection.  Advise increase fluid intake clear liquids to make sure and flush the urinary system  Advised follow-up PCP return to urgent care as needed.

## 2022-05-05 NOTE — ED Provider Notes (Signed)
[facility]    CSN: [id number] Arrival date & time: 05/05/22  1422      History   Chief Complaint Chief Complaint  Patient presents with   Dysuria    HPI Miguel [name] is a 32 y.o. male.   32 year old male presents with dysuria.  Patient indicates for the past 2 weeks he has been having progressing dysuria and frequency.  Patient indicates that over the past 1 to 2 days he has noticed some faint tinged blood in his urine.  He indicates he is having increasing pain when he tries to urinate, and urinating is becoming more difficult to pass a full stream.  He indicates he does not have any back pain, fever, chills nausea or vomiting.  Patient indicates that his last sexual contact was 3 weeks ago and it was unprotected.  He indicates he is not having penile discharge.  Patient indicates that he drinks mainly clear liquids and juices.  He also indicates that he works in [employer] and is doing a lot of heavy lifting throughout the day.  He denies any swelling of the testicles, penile rash, or lesions on the penis.   Dysuria Presenting symptoms: dysuria     Past Medical History:  Diagnosis Date   Bipolar 1 disorder    Depression    Hand fracture, left 01/30/2008   Hand fracture, right 01/29/2006   no surgery required   IV drug user    Schizophrenia     Patient Active Problem List   Diagnosis Date Noted   Altered mental status 07/12/2020   Encephalopathy 07/12/2020   Substance abuse    Schizophrenia 07/11/2020   Depression 07/11/2020   Bipolar 1 disorder 07/11/2020   Opiate overdose 07/10/2020   Polysubstance dependence including opioid type drug with complication, continuous use 03/14/2015   Major depressive disorder, single episode, severe without psychotic features 03/13/2015   Cocaine use disorder, moderate, dependence 09/22/2014   Tobacco use disorder 09/22/2014   Sedative, hypnotic or anxiolytic use disorder, severe, dependence 09/22/2014   HTN  (hypertension) 09/21/2014   Suicide attempt 09/19/2014   Nightmares    PTSD (post-traumatic stress disorder) 06/11/2014   Severe recurrent major depression without psychotic features 06/11/2014   Hand fracture, left 01/30/2008   Hand fracture, right 01/29/2006    Past Surgical History:  Procedure Laterality Date   CYST EXCISION     left hand fracture Left 2010   Dr Magnus Ivan       Home Medications    Prior to Admission medications   Medication Sig Start Date End Date Taking? Authorizing Provider  doxycycline (VIBRAMYCIN) 100 MG capsule Take 1 capsule (100 mg total) by mouth 2 (two) times daily. 05/05/22  Yes Ellsworth Lennox, PA-C  ibuprofen (ADVIL) 600 MG tablet Take 1 tablet (600 mg total) by mouth every 6 (six) hours as needed. 12/09/21   Ellsworth Lennox, PA-C  metroNIDAZOLE (FLAGYL) 500 MG tablet Take 1 tablet (500 mg total) by mouth 2 (two) times daily with a meal. DO NOT CONSUME ALCOHOL WHILE TAKING THIS MEDICATION. 07/22/20   Wallis Bamberg, PA-C    Family History Family History  Problem Relation Age of Onset   Bipolar disorder Mother    Schizophrenia Mother    Schizophrenia Sister     Social History Social History   Tobacco Use   Smoking status: Former    Types: Cigarettes   Smokeless tobacco: Never  Vaping Use   Vaping Use: Every day  Substance Use Topics  Alcohol use: No   Drug use: No     Allergies   Tramadol and Vicodin [hydrocodone-acetaminophen]   Review of Systems Review of Systems  Genitourinary:  Positive for dysuria.     Physical Exam Triage Vital Signs ED Triage Vitals  Enc Vitals Group     BP 05/05/22 1437 121/75     Pulse Rate 05/05/22 1437 80     Resp 05/05/22 1437 16     Temp 05/05/22 1437 98 F (36.7 C)     Temp Source 05/05/22 1437 Oral     SpO2 05/05/22 1437 97 %     Weight --      Height --      Head Circumference --      Peak Flow --      Pain Score 05/05/22 1439 2     Pain Loc --      Pain Edu? --      Excl. in GC? --     No data found.  Updated Vital Signs BP 121/75   Pulse 80   Temp 98 F (36.7 C) (Oral)   Resp 16   SpO2 97%   Visual Acuity Right Eye Distance:   Left Eye Distance:   Bilateral Distance:    Right Eye Near:   Left Eye Near:    Bilateral Near:     Physical Exam Constitutional:      Appearance: Normal appearance.  Abdominal:     General: Abdomen is flat. Bowel sounds are normal.     Palpations: Abdomen is soft.     Tenderness: There is abdominal tenderness (mild) in the suprapubic area. There is no guarding or rebound.  Neurological:     Mental Status: He is alert.      UC Treatments / Results  Labs (all labs ordered are listed, but only abnormal results are displayed) Labs Reviewed  POCT URINALYSIS DIPSTICK, ED / UC - Abnormal; Notable for the following components:      Result Value   Hgb urine dipstick MODERATE (*)    Protein, ur 100 (*)    Leukocytes,Ua SMALL (*)    All other components within normal limits  COMPREHENSIVE METABOLIC PANEL  POCT URINALYSIS DIPSTICK, ED / UC  CYTOLOGY, (ORAL, ANAL, URETHRAL) ANCILLARY ONLY    EKG   Radiology No results found.  Procedures Procedures (including critical care time)  Medications Ordered in UC Medications - No data to display  Initial Impression / Assessment and Plan / UC Course  I have reviewed the triage vital signs and the nursing notes.  Pertinent labs & imaging results that were available during my care of the patient were reviewed by me and considered in my medical decision making (see chart for details).    Plan: The diagnosis will be treated with the following: 1.  Dysuria: A.  Doxycycline 100 mg every 12 hours until completed. B.  STI screening lab results are pending. 2.  Hematuria: A.  Advised to increase fluid intake particularly water based products. B.  CBC lab results are pending. 3.  Proteinuria: A.  CBC lab results are pending. 4.  Advised follow-up PCP return to urgent care as  needed. Final Clinical Impressions(s) / UC Diagnoses   Final diagnoses:  Dysuria  Hematuria, unspecified type  Other proteinuria     Discharge Instructions      Lab results to be completed in 48 hours.  If you do not get a call from this office that indicates the test  are negative.  Log onto MyChart to be the test results when they post in 48 hours.  Advised take the doxycycline 100 mg every 12 hours until completed as this will treat infection.  Advise increase fluid intake clear liquids to make sure and flush the urinary system  Advised follow-up PCP return to urgent care as needed.    ED Prescriptions     Medication Sig Dispense Auth. Provider   doxycycline (VIBRAMYCIN) 100 MG capsule Take 1 capsule (100 mg total) by mouth 2 (two) times daily. 20 capsule Ellsworth LennoxJames, Crissy Mccreadie, PA-C      PDMP not reviewed this encounter.   Ellsworth LennoxJames, Seibert Keeter, PA-C 05/05/22 1504

## 2022-05-07 LAB — CYTOLOGY, (ORAL, ANAL, URETHRAL) ANCILLARY ONLY
Chlamydia: NEGATIVE
Comment: NEGATIVE
Comment: NEGATIVE
Comment: NORMAL
Neisseria Gonorrhea: NEGATIVE
Trichomonas: NEGATIVE

## 2023-02-11 ENCOUNTER — Ambulatory Visit (HOSPITAL_COMMUNITY): Admission: EM | Admit: 2023-02-11 | Discharge: 2023-02-11 | Discharge status: Against Medical Advice | Payer: MEDICAID

## 2023-02-11 NOTE — ED Notes (Signed)
 Attempted to call patient in lobby. No response

## 2023-02-11 NOTE — ED Notes (Signed)
 No answer in waiting x2.

## 2023-02-11 NOTE — ED Notes (Signed)
 No answer in lobby.

## 2023-02-25 ENCOUNTER — Ambulatory Visit (HOSPITAL_COMMUNITY)
Admission: EM | Admit: 2023-02-25 | Discharge: 2023-02-25 | Disposition: A | Discharge status: Home / Self Care | Payer: MEDICAID

## 2023-02-25 ENCOUNTER — Encounter (HOSPITAL_COMMUNITY): Payer: Self-pay

## 2023-02-25 DIAGNOSIS — H6592 Unspecified nonsuppurative otitis media, left ear: Secondary | ICD-10-CM | POA: Diagnosis not present

## 2023-02-25 NOTE — ED Provider Notes (Signed)
[facility]    CSN: [id number] Arrival date & time: 02/25/23  1708      History   Chief Complaint Chief Complaint  Patient presents with   Otalgia    HPI Miguel [name] is a 33 y.o. male.   HPI  Left ear ringing  He reports the ear feels stuffy and  he has persistent ringing in the left ear He denies trauma but has tried using peroxide to clean it last night   He denies trauma to the area  He does work [employer] so there is a lot of loud noises. He does not regularly use ear protection  Past Medical History:  Diagnosis Date   Bipolar 1 disorder (HCC)    Depression    Hand fracture, left 01/30/2008   Hand fracture, right 01/29/2006   no surgery required   IV drug user    Schizophrenia [facility])     Patient Active Problem List   Diagnosis Date Noted   Altered mental status 07/12/2020   Encephalopathy 07/12/2020   Substance abuse (HCC)    Schizophrenia (HCC) 07/11/2020   Depression 07/11/2020   Bipolar 1 disorder (HCC) 07/11/2020   Opiate overdose (HCC) 07/10/2020   Polysubstance dependence including opioid type drug with complication, continuous use (HCC) 03/14/2015   Major depressive disorder, single episode, severe without psychotic features (HCC) 03/13/2015   Cocaine use disorder, moderate, 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   Tobacco use disorder 09/22/2014   Sedative, hypnotic or anxiolytic use disorder, severe, 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  Authorizing Provider  FLUoxetine (PROZAC) 20 MG capsule Take by mouth daily. 02/06/23  Yes [provider]  hydrOXYzine (ATARAX) 50 MG tablet Take 50 mg by mouth 3 (three) times daily as needed. 02/06/23  Yes [provider]  paliperidone (INVEGA) 6 MG 24 hr tablet Take 6 mg by mouth every morning. 02/06/23  Yes [provider]  QUEtiapine (SEROQUEL) 50 MG tablet Take 50-100 mg by mouth at bedtime. 02/06/23  Yes [provider]  SUBOXONE 8-2 MG FILM Place under the tongue 3 (three) times daily. 02/23/23  Yes [provider]    Family History Family History  Problem Relation Age of Onset   Bipolar disorder Mother    Schizophrenia Mother    Schizophrenia Sister     Social History Social History   Tobacco Use   Smoking status: Former    Types: Cigarettes   Smokeless tobacco: Never  Vaping Use   Vaping status: Every Day  Substance Use Topics   Alcohol use: No   Drug use: No     Allergies   Tramadol and Vicodin [hydrocodone-acetaminophen]   Review of Systems Review of Systems  Constitutional:  Negative for chills, fatigue and fever.  HENT:  Positive for sore throat and tinnitus. Negative for congestion, ear discharge, ear pain, sinus pressure and sinus pain.      Physical Exam Triage Vital Signs ED Triage Vitals  Encounter  Vitals Group     BP 02/25/23 1916 (!) 146/88     Systolic BP Percentile --      Diastolic BP Percentile --      Pulse Rate 02/25/23 1916 88     Resp 02/25/23 1916 18     Temp 02/25/23 1916 98.1 F (36.7 C)     Temp Source 02/25/23 1916 Oral     SpO2 02/25/23 1916 100 %     Weight --      Height --      Head Circumference --      Peak Flow --      Pain Score 02/25/23 1917 7     Pain Loc --      Pain Education --      Exclude from Growth Chart --    No data found.  Updated Vital Signs BP (!) 146/88 (BP Location: Right Arm)   Pulse 88   Temp 98.1 F (36.7 C) (Oral)   Resp 18   SpO2 100%   Visual  Acuity Right Eye Distance:   Left Eye Distance:   Bilateral Distance:    Right Eye Near:   Left Eye Near:    Bilateral Near:     Physical Exam Vitals reviewed.  Constitutional:      General: He is awake.     Appearance: Normal appearance. He is well-developed and well-groomed.  HENT:     Head: Normocephalic and atraumatic.     Right Ear: Hearing, tympanic membrane and ear canal normal.     Left Ear: Hearing and ear canal normal. A middle ear effusion is present.     Mouth/Throat:     Lips: Pink.     Mouth: Mucous membranes are moist.     Pharynx: Oropharynx is clear. Uvula midline.  Pulmonary:     Effort: Pulmonary effort is normal.  Musculoskeletal:     Cervical back: Normal range of motion and neck supple.  Neurological:     Mental Status: He is alert.  Psychiatric:        Behavior: Behavior is cooperative.      UC Treatments / Results  Labs (all labs ordered are listed, but only abnormal results are displayed) Labs Reviewed - No data to display  EKG   Radiology No results found.  Procedures Procedures (including critical care time)  Medications Ordered in UC Medications - No data to display  Initial Impression / Assessment and Plan / UC Course  I have reviewed the triage vital signs and the nursing notes.  Pertinent labs & imaging results that were available during my care of the patient were reviewed by me and considered in my medical decision making (see chart for details).      Final Clinical Impressions(s) / UC Diagnoses   Final diagnoses:  Middle ear effusion, left   Acute, new concern Suspect tinnitus is likely secondary to mid ear effusion. No signs of inner ear infection or otitis externa today Recommend starting daily second generation antihistamine and flonase to assist with symptoms Reviewed follow up precautions. Follow up as needed for persistent or progressing symptoms     Discharge Instructions      I also recommend adding an  antihistamine to your daily regimen This includes medications like Claritin, Allegra, Zyrtec- the generics of these work very well and are usually less expensive I recommend using Flonase nasal spray - 2 puffs twice per day to help with your ear fullness and ringing  The antihistamines  and Flonase can take a few weeks to provide significant relief from symptoms but should start to provide some benefit soon.      ED Prescriptions   None    PDMP not reviewed this encounter.   Providence Crosby, PA-C 02/25/23 1948

## 2023-02-25 NOTE — ED Triage Notes (Signed)
Pt c/o ringing in lt ear for a week and a half. Took ibuprofen once with no relief.

## 2023-02-25 NOTE — Discharge Instructions (Addendum)
I also recommend adding an antihistamine to your daily regimen This includes medications like Claritin, Allegra, Zyrtec- the generics of these work very well and are usually less expensive I recommend using Flonase nasal spray - 2 puffs twice per day to help with your ear fullness and ringing  The antihistamines and Flonase can take a few weeks to provide significant relief from symptoms but should start to provide some benefit soon.

## 2023-03-28 ENCOUNTER — Encounter (HOSPITAL_COMMUNITY): Payer: Self-pay

## 2023-03-28 ENCOUNTER — Ambulatory Visit (INDEPENDENT_AMBULATORY_CARE_PROVIDER_SITE_OTHER): Payer: MEDICAID

## 2023-03-28 ENCOUNTER — Ambulatory Visit (HOSPITAL_COMMUNITY)
Admission: EM | Admit: 2023-03-28 | Discharge: 2023-03-28 | Disposition: A | Discharge status: Home / Self Care | Payer: MEDICAID | Attending: Family Medicine | Admitting: Family Medicine

## 2023-03-28 ENCOUNTER — Other Ambulatory Visit (HOSPITAL_COMMUNITY): Payer: MEDICAID

## 2023-03-28 DIAGNOSIS — R0602 Shortness of breath: Secondary | ICD-10-CM

## 2023-03-28 LAB — POC COVID19/FLU A&B COMBO
Covid Antigen, POC: NEGATIVE
Influenza A Antigen, POC: NEGATIVE
Influenza B Antigen, POC: NEGATIVE

## 2023-03-28 MED ORDER — ACETAMINOPHEN 325 MG PO TABS
650.0000 mg | ORAL_TABLET | Freq: Once | ORAL | Status: AC
  Administered 2023-03-28: 650 mg via ORAL

## 2023-03-28 MED ORDER — IPRATROPIUM-ALBUTEROL 0.5-2.5 (3) MG/3ML IN SOLN
3.0000 mL | Freq: Once | RESPIRATORY_TRACT | Status: AC
  Administered 2023-03-28: 3 mL via RESPIRATORY_TRACT

## 2023-03-28 MED ORDER — IPRATROPIUM-ALBUTEROL 0.5-2.5 (3) MG/3ML IN SOLN
RESPIRATORY_TRACT | Status: AC
  Filled 2023-03-28: qty 3

## 2023-03-28 MED ORDER — DOXYCYCLINE HYCLATE 100 MG PO CAPS: 100.0000 mg | ORAL_CAPSULE | Freq: Two times a day (BID) | ORAL | 0 refills | Status: AC

## 2023-03-28 MED ORDER — PROMETHAZINE-DM 6.25-15 MG/5ML PO SYRP: 5.0000 mL | ORAL_SOLUTION | Freq: Four times a day (QID) | ORAL | 0 refills | Status: AC | PRN

## 2023-03-28 MED ORDER — ACETAMINOPHEN 325 MG PO TABS
ORAL_TABLET | ORAL | Status: AC
Start: 2023-03-28 — End: ?
  Filled 2023-03-28: qty 2

## 2023-03-28 NOTE — ED Triage Notes (Signed)
 Pt c/o productive cough with brown sputum, headache, and fever since Tuesday. Took nyquil at 9am.

## 2023-03-29 NOTE — ED Provider Notes (Signed)
 [facility]   [id number] 03/28/23 Arrival Time: 1522  ASSESSMENT & PLAN:  1. SOB (shortness of breath)    Patient left prior to discussion regarding x-ray findings. RN able to contact him by phone to inform him and let him know of medications I have prescribed.  Begin: Meds ordered this encounter  Medications   acetaminophen (TYLENOL) tablet 650 mg   ipratropium-albuterol (DUONEB) 0.5-2.5 (3) MG/3ML nebulizer solution 3 mL   doxycycline (VIBRAMYCIN) 100 MG capsule    Sig: Take 1 capsule (100 mg total) by mouth 2 (two) times daily.    Dispense:  14 capsule    Refill:  0   promethazine-dextromethorphan (PROMETHAZINE-DM) 6.25-15 MG/5ML syrup    Sig: Take 5 mLs by mouth 4 (four) times daily as needed for cough.    Dispense:  118 mL    Refill:  0   Results for orders placed or performed during the hospital encounter of 03/28/23  POC Covid19/Flu A&B Antigen   Collection Time: 03/28/23  4:33 PM  Result Value Ref Range   Influenza A Antigen, POC Negative Negative   Influenza B Antigen, POC Negative Negative   Covid Antigen, POC Negative Negative   No resp distress.  Reviewed expectations re: course of current medical issues. Questions answered. Outlined signs and symptoms indicating need for more acute intervention. Understanding verbalized. After Visit Summary given.   SUBJECTIVE: History from: Patient. Miguel [name] is a 33 y.o. male. Pt c/o productive cough with brown sputum, headache, and fever since Tuesday. Took nyquil at 9am. Is SOB at times. Feels fatigued. Normal PO intake without n/v/d.  Social History   Tobacco Use  Smoking Status Former   Types: Cigarettes  Smokeless Tobacco Never   OBJECTIVE:  Vitals:   03/28/23 1605 03/28/23 1608  BP: 139/87   Pulse: (!) 118 (!) 114  Resp: 18   Temp: (!) 100.8 F (38.2 C)   TempSrc: Oral   SpO2: (!) 89% 90%    Last O2 sat I observed was 91% prior to duoneb (pt later informed RN by phone that he  felt duoneb helped)  General appearance: alert; no distress but appears fatigued Eyes: PERRLA; EOMI; conjunctiva normal HENT: [place]; AT; with nasal congestion Neck: supple  Lungs: speaks full sentences without difficulty; unlabored; coarse breath sounds bilaterally; active coughing Extremities: no edema Skin: warm and dry Neurologic: normal gait Psychological: alert and cooperative; normal mood and affect  Labs: Results for orders placed or performed during the hospital encounter of 03/28/23  POC Covid19/Flu A&B Antigen   Collection Time: 03/28/23  4:33 PM  Result Value Ref Range   Influenza A Antigen, POC Negative Negative   Influenza B Antigen, POC Negative Negative   Covid Antigen, POC Negative Negative   Labs Reviewed  POC COVID19/FLU A&B COMBO    Imaging: DG Chest 2 View Result Date: 03/28/2023 CLINICAL DATA:  Shortness of breath. EXAM: CHEST - 2 VIEW COMPARISON:  November 05, 2020 FINDINGS: The heart size and mediastinal contours are within normal limits. Moderate severity infiltrate is seen along the periphery of the right upper lobe. No pleural effusion or pneumothorax is identified. The visualized skeletal structures are unremarkable. IMPRESSION: Moderate severity right upper lobe infiltrate. Follow-up to resolution is recommended to exclude the presence of an underlying neoplastic process. Electronically Signed   By: Aram Candela M.D.   On: 03/28/2023 19:09    Allergies  Allergen Reactions   Tramadol Hives   Vicodin [Hydrocodone-Acetaminophen] Nausea Only  Past Medical History:  Diagnosis Date   Bipolar 1 disorder (HCC)    Depression    Hand fracture, left 01/30/2008   Hand fracture, right 01/29/2006   no surgery required   IV drug user    Schizophrenia [facility])    Social History   Socioeconomic History   Marital status: Single    Spouse name: Not on file   Number of children: Not on file   Years of education: Not on file   Highest education level: Not on  file  Occupational History   Not on file  Tobacco Use   Smoking status: Former    Types: Cigarettes   Smokeless tobacco: Never  Vaping Use   Vaping status: Every Day  Substance and Sexual Activity   Alcohol use: No   Drug use: No   Sexual activity: Yes    Birth control/protection: None  Other Topics Concern   Not on file  Social History Narrative   Not on file   Social Drivers of Health   Financial Resource Strain: Not on file  Food Insecurity: Not on file  Transportation Needs: Not on file  Physical Activity: Not on file  Stress: Not on file  Social Connections: Not on file  Intimate Partner Violence: Not on file   Family History  Problem Relation Age of Onset   Bipolar disorder Mother    Schizophrenia Mother    Schizophrenia Sister    Past Surgical History:  Procedure Laterality Date   CYST EXCISION     left hand fracture Left 2010   Dr Philip Aspen, MD 03/29/23 [phone]
